# Patient Record
Sex: Female | Born: 1980 | Race: Black or African American | Hispanic: No | Smoking: Never smoker
Health system: Southern US, Community
[De-identification: ages and names within clinical notes are randomized; demographics above are authoritative.]

## PROBLEM LIST (undated history)

## (undated) DIAGNOSIS — I10 Essential (primary) hypertension: Secondary | ICD-10-CM

## (undated) DIAGNOSIS — D219 Benign neoplasm of connective and other soft tissue, unspecified: Secondary | ICD-10-CM

## (undated) DIAGNOSIS — J45909 Unspecified asthma, uncomplicated: Secondary | ICD-10-CM

## (undated) DIAGNOSIS — O139 Gestational [pregnancy-induced] hypertension without significant proteinuria, unspecified trimester: Secondary | ICD-10-CM

## (undated) DIAGNOSIS — F419 Anxiety disorder, unspecified: Secondary | ICD-10-CM

## (undated) DIAGNOSIS — D649 Anemia, unspecified: Secondary | ICD-10-CM

## (undated) HISTORY — PX: MYOMECTOMY: SHX85

## (undated) HISTORY — DX: Benign neoplasm of connective and other soft tissue, unspecified: D21.9

## (undated) HISTORY — PX: HYSTEROSCOPY: SHX211

---
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
## 1999-06-16 ENCOUNTER — Other Ambulatory Visit: Admission: RE | Admit: 1999-06-16 | Discharge: 1999-06-16 | Payer: Self-pay | Admitting: Internal Medicine

## 2001-10-08 ENCOUNTER — Emergency Department (HOSPITAL_COMMUNITY): Admission: EM | Admit: 2001-10-08 | Discharge: 2001-10-09 | Payer: Self-pay | Admitting: Emergency Medicine

## 2001-10-14 ENCOUNTER — Emergency Department (HOSPITAL_COMMUNITY): Admission: EM | Admit: 2001-10-14 | Discharge: 2001-10-14 | Payer: Self-pay | Admitting: Emergency Medicine

## 2001-12-07 ENCOUNTER — Emergency Department (HOSPITAL_COMMUNITY): Admission: EM | Admit: 2001-12-07 | Discharge: 2001-12-08 | Payer: Self-pay | Admitting: Emergency Medicine

## 2004-08-07 ENCOUNTER — Other Ambulatory Visit: Admission: RE | Admit: 2004-08-07 | Discharge: 2004-08-07 | Payer: Self-pay | Admitting: Gynecology

## 2005-05-30 ENCOUNTER — Emergency Department (HOSPITAL_COMMUNITY): Admission: EM | Admit: 2005-05-30 | Discharge: 2005-05-30 | Payer: Self-pay | Admitting: Emergency Medicine

## 2005-06-25 ENCOUNTER — Emergency Department (HOSPITAL_COMMUNITY): Admission: EM | Admit: 2005-06-25 | Discharge: 2005-06-25 | Payer: Self-pay | Admitting: Emergency Medicine

## 2005-08-21 ENCOUNTER — Other Ambulatory Visit: Admission: RE | Admit: 2005-08-21 | Discharge: 2005-08-21 | Payer: Self-pay | Admitting: Gynecology

## 2006-05-10 ENCOUNTER — Emergency Department (HOSPITAL_COMMUNITY): Admission: EM | Admit: 2006-05-10 | Discharge: 2006-05-10 | Payer: Self-pay | Admitting: Emergency Medicine

## 2006-05-13 ENCOUNTER — Emergency Department (HOSPITAL_COMMUNITY): Admission: EM | Admit: 2006-05-13 | Discharge: 2006-05-13 | Payer: Self-pay | Admitting: Emergency Medicine

## 2007-06-09 ENCOUNTER — Other Ambulatory Visit: Admission: RE | Admit: 2007-06-09 | Discharge: 2007-06-09 | Payer: Self-pay | Admitting: Gynecology

## 2008-02-01 ENCOUNTER — Ambulatory Visit: Payer: Self-pay | Admitting: Women's Health

## 2009-06-07 ENCOUNTER — Ambulatory Visit: Payer: Self-pay | Admitting: Women's Health

## 2009-06-07 ENCOUNTER — Other Ambulatory Visit: Admission: RE | Admit: 2009-06-07 | Discharge: 2009-06-07 | Payer: Self-pay | Admitting: Gynecology

## 2011-01-21 ENCOUNTER — Encounter: Payer: Self-pay | Admitting: Women's Health

## 2011-02-04 ENCOUNTER — Encounter: Payer: Self-pay | Admitting: Women's Health

## 2011-02-04 ENCOUNTER — Other Ambulatory Visit (HOSPITAL_COMMUNITY)
Admission: RE | Admit: 2011-02-04 | Discharge: 2011-02-04 | Disposition: A | Discharge status: Home / Self Care | Payer: 59 | Source: Ambulatory Visit | Attending: Gynecology | Admitting: Gynecology

## 2011-02-04 ENCOUNTER — Ambulatory Visit (INDEPENDENT_AMBULATORY_CARE_PROVIDER_SITE_OTHER): Payer: 59 | Admitting: Women's Health

## 2011-02-04 VITALS — BP 118/72 | Ht 64.0 in | Wt 269.0 lb

## 2011-02-04 DIAGNOSIS — R82998 Other abnormal findings in urine: Secondary | ICD-10-CM

## 2011-02-04 DIAGNOSIS — Z01419 Encounter for gynecological examination (general) (routine) without abnormal findings: Secondary | ICD-10-CM | POA: Insufficient documentation

## 2011-02-04 DIAGNOSIS — Z833 Family history of diabetes mellitus: Secondary | ICD-10-CM

## 2011-02-04 DIAGNOSIS — Z113 Encounter for screening for infections with a predominantly sexual mode of transmission: Secondary | ICD-10-CM

## 2011-02-04 NOTE — Progress Notes (Signed)
Addended by: Landis Martins R on: 02/04/2011 04:33 PM   Modules accepted: Orders

## 2011-02-04 NOTE — Progress Notes (Signed)
SAMONA CHIHUAHUA [birth year] [id number]   History:    The patient presents for annual exam. Management for hardees. Has not been able to conceive, husband has a confirmed very low sperm count from an SA at a urologist office. He is also a diabetic on insulin and is hypertensive.     Past medical history, past surgical history, family history and social history were all reviewed and documented in the EPIC chart.   ROS:  A  ROS was performed and pertinent positives and negatives are included in the history.  Exam:  Filed Vitals:   02/04/11 1537  BP: 118/72    General appearance:  Normal Head/Neck:  Normal, without cervical or supraclavicular adenopathy. Thyroid:  Symmetrical, normal in size, without palpable masses or nodularity. Respiratory  Effort:  Normal  Auscultation:  Clear without wheezing or rhonchi Cardiovascular  Auscultation:  Regular rate, without rubs, murmurs or gallops  Edema/varicosities:  Not grossly evident Abdominal  Soft,nontender, without masses, guarding or rebound.  Liver/spleen:  No organomegaly noted  Hernia:  None appreciated  Skin  Inspection:  Grossly normal  Palpation:  Grossly normal Neurologic/psychiatric  Orientation:  Normal with appropriate conversation.  Mood/affect:  Normal  Genitourinary    Breasts: Examined lying and sitting.     Right: Without masses, retractions, discharge or axillary adenopathy.     Left: Without masses, retractions, discharge or axillary adenopathy.   Inguinal/mons:  Normal without inguinal adenopathy  External genitalia:  Normal  BUS/Urethra/Skene's glands:  Normal  Bladder:  Normal  Vagina:  Normal  Cervix:  Normal  Uterus:   normal in size, shape and contour.  Midline and mobile  Adnexa/parametria:     Rt: Without masses or tenderness.   Lt: Without masses or tenderness.  Anus and perineum: Normal  Digital rectal exam: Normal sphincter tone without palpated masses or tenderness  Assessment/Plan:  30 y.o.  MBF G0 for annual exam monthly 4 day cycle using no contraception. She would like to conceive, has spoken to an adoption agency and has thought about donor insemination. Long discussion about weight loss for her health prior to  pregnancy. They did have a breakup this past year where he was sexually active with someone else, states marriage is now better.  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 CBC, glucose, UA, Pap, GC/ Chlamydia, HIV, hepatitis B and C and RPR.    Harrington Challenger [facility], 4:01 PM 02/04/2011

## 2011-02-05 LAB — HEPATITIS C ANTIBODY: HCV Ab: NEGATIVE

## 2011-02-05 LAB — HIV ANTIBODY (ROUTINE TESTING W REFLEX): HIV: NONREACTIVE

## 2011-02-05 LAB — RPR

## 2012-04-22 ENCOUNTER — Encounter (HOSPITAL_COMMUNITY): Payer: Self-pay | Admitting: Adult Health

## 2012-04-22 DIAGNOSIS — Z3202 Encounter for pregnancy test, result negative: Secondary | ICD-10-CM | POA: Insufficient documentation

## 2012-04-22 DIAGNOSIS — R109 Unspecified abdominal pain: Secondary | ICD-10-CM | POA: Insufficient documentation

## 2012-04-22 DIAGNOSIS — Z8742 Personal history of other diseases of the female genital tract: Secondary | ICD-10-CM | POA: Insufficient documentation

## 2012-04-22 DIAGNOSIS — R110 Nausea: Secondary | ICD-10-CM | POA: Insufficient documentation

## 2012-04-22 NOTE — ED Notes (Signed)
Presents with lower back pain and lower abdominal pain that began 2 hours ago associated with nausea. Pain is better after taking acetaminophen but nausea still present. Pt reports she is about to start period and has had some blood from vagina but not like period. Denies urinary difficulty, denies dysuria, denies foul odors, c/o urinary frequency. LMP unknown.

## 2012-04-23 ENCOUNTER — Emergency Department (HOSPITAL_COMMUNITY)
Admission: EM | Admit: 2012-04-23 | Discharge: 2012-04-23 | Disposition: A | Discharge status: Home / Self Care | Payer: Self-pay | Attending: Emergency Medicine | Admitting: Emergency Medicine

## 2012-04-23 DIAGNOSIS — R109 Unspecified abdominal pain: Secondary | ICD-10-CM

## 2012-04-23 LAB — URINALYSIS, MICROSCOPIC ONLY
Glucose, UA: NEGATIVE mg/dL
pH: 5 (ref 5.0–8.0)

## 2012-04-23 LAB — CBC WITH DIFFERENTIAL/PLATELET
Basophils Absolute: 0 10*3/uL (ref 0.0–0.1)
Eosinophils Absolute: 0 10*3/uL (ref 0.0–0.7)
Eosinophils Relative: 1 % (ref 0–5)
HCT: 35.9 % — ABNORMAL LOW (ref 36.0–46.0)
Hemoglobin: 11.7 g/dL — ABNORMAL LOW (ref 12.0–15.0)
Lymphocytes Relative: 19 % (ref 12–46)
MCH: 26 pg (ref 26.0–34.0)
Neutro Abs: 5.3 10*3/uL (ref 1.7–7.7)
Neutrophils Relative %: 73 % (ref 43–77)
RBC: 4.5 MIL/uL (ref 3.87–5.11)
WBC: 7.3 10*3/uL (ref 4.0–10.5)

## 2012-04-23 LAB — BASIC METABOLIC PANEL
BUN: 10 mg/dL (ref 6–23)
Chloride: 103 mEq/L (ref 96–112)
Potassium: 3.9 mEq/L (ref 3.5–5.1)
Sodium: 137 mEq/L (ref 135–145)

## 2012-04-23 NOTE — ED Notes (Signed)
Pt dc to home.  Pt ambulatory to exit without difficulty.  Pt denies need for w/c.  Pt states understanding to dc paperwork

## 2012-04-23 NOTE — ED Provider Notes (Signed)
Medical screening examination/treatment/procedure(s) were performed by non-physician practitioner and as supervising physician I was immediately available for consultation/collaboration.  Tobin Chad, MD 04/23/12 [phone]

## 2012-04-23 NOTE — ED Provider Notes (Signed)
History     CSN: [id number]  Arrival date & time 04/22/12  2333   First MD Initiated Contact with Patient 04/23/12 0049      Chief Complaint  Patient presents with  . Abdominal Pain    (Consider location/radiation/quality/duration/timing/severity/associated sxs/prior treatment) HPI Comments: This is a 31 year old female who presents to the ED with a chief complaint of abdominal pain.  Patient states that she has a history of dysmenorrhea.  She states that she is currently on her period.  She states that the pain began this several hours ago and radiated to her back.  The patient took tylenol, and now has 0/10 pain.  The patient denies dysuria, vaginal discharge.  Patient denies headache, blurred vision, new hearing loss, sore throat, chest pain, shortness of breath, vomiting, diarrhea, constipation, dysuria, peripheral edema, back pain, numbness or tingling of the extremities.   The history is provided by the patient. No language interpreter was used.    Past Medical History  Diagnosis Date  . Morbid obesity     5'4 1/2"  263 lbs on 06/07/09    History reviewed. No pertinent past surgical history.  Family History  Problem Relation Age of Onset  . Hypertension Mother   . Hypertension Father     History  Substance Use Topics  . Smoking status: Never Smoker   . Smokeless tobacco: Never Used  . Alcohol Use: Yes     Comment: SOCIALLY    OB History    Grav Para Term Preterm Abortions TAB SAB Ect Mult Living   0               Review of Systems  Gastrointestinal: Positive for nausea. Negative for vomiting, abdominal pain and abdominal distention.  All other systems reviewed and are negative.    Allergies  Review of patient's allergies indicates no known allergies.  Home Medications  No current outpatient prescriptions on file.  BP 142/93  Pulse 92  Temp 98.2 F (36.8 C) (Oral)  Resp 16  SpO2 100%  Physical Exam  Nursing note and vitals  reviewed. Constitutional: She is oriented to person, place, and time. She appears well-developed and well-nourished.  HENT:  Head: Normocephalic and atraumatic.  Eyes: Conjunctivae normal and EOM are normal. Pupils are equal, round, and reactive to light.  Neck: Normal range of motion. Neck supple.  Cardiovascular: Normal rate and regular rhythm.  Exam reveals no gallop and no friction rub.   No murmur heard. Pulmonary/Chest: Effort normal and breath sounds normal. No respiratory distress. She has no wheezes. She has no rales. She exhibits no tenderness.  Abdominal: Soft. Bowel sounds are normal. She exhibits no distension and no mass. There is no tenderness. There is no rebound and no guarding.  Genitourinary: No labial fusion. There is no rash, tenderness, lesion or injury on the right labia. There is no rash, tenderness, lesion or injury on the left labia. Uterus is not deviated, not enlarged, not fixed and not tender. Cervix exhibits no motion tenderness, no discharge and no friability. Right adnexum displays no mass, no tenderness and no fullness. Left adnexum displays no mass, no tenderness and no fullness. There is bleeding around the vagina. No erythema or tenderness around the vagina. No foreign body around the vagina. No signs of injury around the vagina. No vaginal discharge found.  Musculoskeletal: Normal range of motion. She exhibits no edema and no tenderness.  Neurological: She is alert and oriented to person, place, and time.  Skin: Skin is warm and dry.  Psychiatric: She has a normal mood and affect. Her behavior is normal. Judgment and thought content normal.    ED Course  Procedures (including critical care time)  Labs Reviewed  URINALYSIS, MICROSCOPIC ONLY - Abnormal; Notable for the following:    Color, Urine RED (*)  BIOCHEMICALS MAY BE AFFECTED BY COLOR   APPearance TURBID (*)     Hgb urine dipstick LARGE (*)     Bilirubin Urine SMALL (*)     Ketones, ur 15 (*)      Protein, ur 100 (*)     Leukocytes, UA SMALL (*)     Bacteria, UA MANY (*)     Squamous Epithelial / LPF MANY (*)     All other components within normal limits  POCT PREGNANCY, URINE  URINE CULTURE  WET PREP, GENITAL  GC/CHLAMYDIA PROBE AMP  CBC WITH DIFFERENTIAL  BASIC METABOLIC PANEL   Results for orders placed during the hospital encounter of 04/23/12  URINALYSIS, MICROSCOPIC ONLY      Component Value Range   Color, Urine RED (*) YELLOW   APPearance TURBID (*) CLEAR   Specific Gravity, Urine 1.028  1.005 - 1.030   pH 5.0  5.0 - 8.0   Glucose, UA NEGATIVE  NEGATIVE mg/dL   Hgb urine dipstick LARGE (*) NEGATIVE   Bilirubin Urine SMALL (*) NEGATIVE   Ketones, ur 15 (*) NEGATIVE mg/dL   Protein, ur 409 (*) NEGATIVE mg/dL   Urobilinogen, UA 1.0  0.0 - 1.0 mg/dL   Nitrite NEGATIVE  NEGATIVE   Leukocytes, UA SMALL (*) NEGATIVE   WBC, UA 3-6  <3 WBC/hpf   RBC / HPF TOO NUMEROUS TO COUNT  <3 RBC/hpf   Bacteria, UA MANY (*) RARE   Squamous Epithelial / LPF MANY (*) RARE  POCT PREGNANCY, URINE      Component Value Range   Preg Test, Ur NEGATIVE  NEGATIVE  WET PREP, GENITAL      Component Value Range   Yeast Wet Prep HPF POC NONE SEEN  NONE SEEN   Trich, Wet Prep NONE SEEN  NONE SEEN   Clue Cells Wet Prep HPF POC FEW (*) NONE SEEN   WBC, Wet Prep HPF POC FEW (*) NONE SEEN  CBC WITH DIFFERENTIAL      Component Value Range   WBC 7.3  4.0 - 10.5 K/uL   RBC 4.50  3.87 - 5.11 MIL/uL   Hemoglobin 11.7 (*) 12.0 - 15.0 g/dL   HCT 81.1 (*) 91.4 - 78.2 %   MCV 79.8  78.0 - 100.0 fL   MCH 26.0  26.0 - 34.0 pg   MCHC 32.6  30.0 - 36.0 g/dL   RDW 95.6  21.3 - 08.6 %   Platelets 292  150 - 400 K/uL   Neutrophils Relative 73  43 - 77 %   Neutro Abs 5.3  1.7 - 7.7 K/uL   Lymphocytes Relative 19  12 - 46 %   Lymphs Abs 1.4  0.7 - 4.0 K/uL   Monocytes Relative 7  3 - 12 %   Monocytes Absolute 0.5  0.1 - 1.0 K/uL   Eosinophils Relative 1  0 - 5 %   Eosinophils Absolute 0.0  0.0 - 0.7  K/uL   Basophils Relative 1  0 - 1 %   Basophils Absolute 0.0  0.0 - 0.1 K/uL  BASIC METABOLIC PANEL      Component Value Range   Sodium 137  135 - 145 mEq/L   Potassium 3.9  3.5 - 5.1 mEq/L   Chloride 103  96 - 112 mEq/L   CO2 23  19 - 32 mEq/L   Glucose, Bld 108 (*) 70 - 99 mg/dL   BUN 10  6 - 23 mg/dL   Creatinine, Ser 1.61  0.50 - 1.10 mg/dL   Calcium 9.6  8.4 - 09.6 mg/dL   GFR calc non Af Amer >90  >90 mL/min   GFR calc Af Amer >90  >90 mL/min   No results found.    1. Abdominal  pain, other specified site       MDM  31 year old female with abdominal pain. Patient states that the pain began approximately 2 hours ago. She states that she has had this pain before and that it is associated with menstruation. She took acetaminophen, which significantly reduced if not resolved her pain. Her abdominal exam has been unremarkable, no reproducible tenderness, or signs of peritonitis, on pelvic exam there is no adnexal tenderness cervical motion tenderness, and no masses were palpated. The patient remains asymptomatic in the ED at this time. In considering further imaging, I feel that the patient is appropriate to be discharged at this time, as she remains asymptomatic, and without reproducible pain. I'm going to discharge the patient to home with strict return precautions. Patient is to return if she notices any more pain, or if she develops fever, vomiting, diarrhea. I discussed this patient with Dr. Lorenso Courier, who agrees with the plan. She is stable and ready for discharge.        Roxy Horseman, PA-C 04/23/12 [phone]

## 2012-04-24 LAB — URINE CULTURE

## 2012-07-11 ENCOUNTER — Emergency Department: Payer: Self-pay | Admitting: Emergency Medicine

## 2014-10-05 ENCOUNTER — Ambulatory Visit: Payer: Self-pay | Admitting: Women's Health

## 2014-10-23 ENCOUNTER — Ambulatory Visit: Payer: 59 | Admitting: Women's Health

## 2014-11-06 ENCOUNTER — Encounter: Payer: Self-pay | Admitting: Women's Health

## 2014-11-06 ENCOUNTER — Ambulatory Visit (INDEPENDENT_AMBULATORY_CARE_PROVIDER_SITE_OTHER): Payer: 59 | Admitting: Women's Health

## 2014-11-06 ENCOUNTER — Other Ambulatory Visit (HOSPITAL_COMMUNITY)
Admission: RE | Admit: 2014-11-06 | Discharge: 2014-11-06 | Disposition: A | Discharge status: Home / Self Care | Payer: 59 | Source: Ambulatory Visit | Attending: Women's Health | Admitting: Women's Health

## 2014-11-06 VITALS — BP 132/78 | Ht 65.0 in | Wt 291.0 lb

## 2014-11-06 DIAGNOSIS — Z1151 Encounter for screening for human papillomavirus (HPV): Secondary | ICD-10-CM | POA: Diagnosis present

## 2014-11-06 DIAGNOSIS — Z113 Encounter for screening for infections with a predominantly sexual mode of transmission: Secondary | ICD-10-CM | POA: Insufficient documentation

## 2014-11-06 DIAGNOSIS — R87810 Cervical high risk human papillomavirus (HPV) DNA test positive: Secondary | ICD-10-CM | POA: Insufficient documentation

## 2014-11-06 DIAGNOSIS — Z01419 Encounter for gynecological examination (general) (routine) without abnormal findings: Secondary | ICD-10-CM | POA: Insufficient documentation

## 2014-11-06 DIAGNOSIS — Z124 Encounter for screening for malignant neoplasm of cervix: Secondary | ICD-10-CM

## 2014-11-06 NOTE — Patient Instructions (Signed)
Health Maintenance Adopting a healthy lifestyle and getting preventive care can go a long way to promote health and wellness. Talk with your health care provider about what schedule of regular examinations is right for you. This is a good chance for you to check in with your provider about disease prevention and staying healthy. In between checkups, there are plenty of things you can do on your own. Experts have done a lot of research about which lifestyle changes and preventive measures are most likely to keep you healthy. Ask your health care provider for more information. WEIGHT AND DIET  Eat a healthy diet  Be sure to include plenty of vegetables, fruits, low-fat dairy products, and lean protein.  Do not eat a lot of foods high in solid fats, added sugars, or salt.  Get regular exercise. This is one of the most important things you can do for your health.  Most adults should exercise for at least 150 minutes each week. The exercise should increase your heart rate and make you sweat (moderate-intensity exercise).  Most adults should also do strengthening exercises at least twice a week. This is in addition to the moderate-intensity exercise.  Maintain a healthy weight  Body mass index (BMI) is a measurement that can be used to identify possible weight problems. It estimates body fat based on height and weight. Your health care provider can help determine your BMI and help you achieve or maintain a healthy weight.  For females 25 years of age and older:   A BMI below 18.5 is considered underweight.  A BMI of 18.5 to 24.9 is normal.  A BMI of 25 to 29.9 is considered overweight.  A BMI of 30 and above is considered obese.  Watch levels of cholesterol and blood lipids  You should start having your blood tested for lipids and cholesterol at 35 years of age, then have this test every 5 years.  You may need to have your cholesterol levels checked more often if:  Your lipid or  cholesterol levels are high.  You are older than 35 years of age.  You are at high risk for heart disease.  CANCER SCREENING   Lung Cancer  Lung cancer screening is recommended for adults 97-92 years old who are at high risk for lung cancer because of a history of smoking.  A yearly low-dose CT scan of the lungs is recommended for people who:  Currently smoke.  Have quit within the past 15 years.  Have at least a 30-pack-year history of smoking. A pack year is smoking an average of one pack of cigarettes a day for 1 year.  Yearly screening should continue until it has been 15 years since you quit.  Yearly screening should stop if you develop a health problem that would prevent you from having lung cancer treatment.  Breast Cancer  Practice breast self-awareness. This means understanding how your breasts normally appear and feel.  It also means doing regular breast self-exams. Let your health care provider know about any changes, no matter how small.  If you are in your 20s or 30s, you should have a clinical breast exam (CBE) by a health care provider every 1-3 years as part of a regular health exam.  If you are 76 or older, have a CBE every year. Also consider having a breast X-ray (mammogram) every year.  If you have a family history of breast cancer, talk to your health care provider about genetic screening.  If you are  at high risk for breast cancer, talk to your health care provider about having an MRI and a mammogram every year.  Breast cancer gene (BRCA) assessment is recommended for women who have family members with BRCA-related cancers. BRCA-related cancers include:  Breast.  Ovarian.  Tubal.  Peritoneal cancers.  Results of the assessment will determine the need for genetic counseling and BRCA1 and BRCA2 testing. Cervical Cancer Routine pelvic examinations to screen for cervical cancer are no longer recommended for nonpregnant women who are considered low  risk for cancer of the pelvic organs (ovaries, uterus, and vagina) and who do not have symptoms. A pelvic examination may be necessary if you have symptoms including those associated with pelvic infections. Ask your health care provider if a screening pelvic exam is right for you.   The Pap test is the screening test for cervical cancer for women who are considered at risk.  If you had a hysterectomy for a problem that was not cancer or a condition that could lead to cancer, then you no longer need Pap tests.  If you are older than 65 years, and you have had normal Pap tests for the past 10 years, you no longer need to have Pap tests.  If you have had past treatment for cervical cancer or a condition that could lead to cancer, you need Pap tests and screening for cancer for at least 20 years after your treatment.  If you no longer get a Pap test, assess your risk factors if they change (such as having a new sexual partner). This can affect whether you should start being screened again.  Some women have medical problems that increase their chance of getting cervical cancer. If this is the case for you, your health care provider may recommend more frequent screening and Pap tests.  The human papillomavirus (HPV) test is another test that may be used for cervical cancer screening. The HPV test looks for the virus that can cause cell changes in the cervix. The cells collected during the Pap test can be tested for HPV.  The HPV test can be used to screen women 30 years of age and older. Getting tested for HPV can extend the interval between normal Pap tests from three to five years.  An HPV test also should be used to screen women of any age who have unclear Pap test results.  After 35 years of age, women should have HPV testing as often as Pap tests.  Colorectal Cancer  This type of cancer can be detected and often prevented.  Routine colorectal cancer screening usually begins at 35 years of  age and continues through 35 years of age.  Your health care provider may recommend screening at an earlier age if you have risk factors for colon cancer.  Your health care provider may also recommend using home test kits to check for hidden blood in the stool.  A small camera at the end of a tube can be used to examine your colon directly (sigmoidoscopy or colonoscopy). This is done to check for the earliest forms of colorectal cancer.  Routine screening usually begins at age 50.  Direct examination of the colon should be repeated every 5-10 years through 35 years of age. However, you may need to be screened more often if early forms of precancerous polyps or small growths are found. Skin Cancer  Check your skin from head to toe regularly.  Tell your health care provider about any new moles or changes in   moles, especially if there is a change in a mole's shape or color.  Also tell your health care provider if you have a mole that is larger than the size of a pencil eraser.  Always use sunscreen. Apply sunscreen liberally and repeatedly throughout the day.  Protect yourself by wearing long sleeves, pants, a wide-brimmed hat, and sunglasses whenever you are outside. HEART DISEASE, DIABETES, AND HIGH BLOOD PRESSURE   Have your blood pressure checked at least every 1-2 years. High blood pressure causes heart disease and increases the risk of stroke.  If you are between 75 years and 42 years old, ask your health care provider if you should take aspirin to prevent strokes.  Have regular diabetes screenings. This involves taking a blood sample to check your fasting blood sugar level.  If you are at a normal weight and have a low risk for diabetes, have this test once every three years after 35 years of age.  If you are overweight and have a high risk for diabetes, consider being tested at a younger age or more often. PREVENTING INFECTION  Hepatitis B  If you have a higher risk for  hepatitis B, you should be screened for this virus. You are considered at high risk for hepatitis B if:  You were born in a country where hepatitis B is common. Ask your health care provider which countries are considered high risk.  Your parents were born in a high-risk country, and you have not been immunized against hepatitis B (hepatitis B vaccine).  You have HIV or AIDS.  You use needles to inject street drugs.  You live with someone who has hepatitis B.  You have had sex with someone who has hepatitis B.  You get hemodialysis treatment.  You take certain medicines for conditions, including cancer, organ transplantation, and autoimmune conditions. Hepatitis C  Blood testing is recommended for:  Everyone born from [birth year] through 1966.  Anyone with known risk factors for hepatitis C. Sexually transmitted infections (STIs)  You should be screened for sexually transmitted infections (STIs) including gonorrhea and chlamydia if:  You are sexually active and are younger than 35 years of age.  You are older than 35 years of age and your health care provider tells you that you are at risk for this type of infection.  Your sexual activity has changed since you were last screened and you are at an increased risk for chlamydia or gonorrhea. Ask your health care provider if you are at risk.  If you do not have HIV, but are at risk, it may be recommended that you take a prescription medicine daily to prevent HIV infection. This is called pre-exposure prophylaxis (PrEP). You are considered at risk if:  You are sexually active and do not regularly use condoms or know the HIV status of your partner(s).  You take drugs by injection.  You are sexually active with a partner who has HIV. Talk with your health care provider about whether you are at high risk of being infected with HIV. If you choose to begin PrEP, you should first be tested for HIV. You should then be tested every 3 months for  as long as you are taking PrEP.  PREGNANCY   If you are premenopausal and you may become pregnant, ask your health care provider about preconception counseling.  If you may become pregnant, take 400 to 800 micrograms (mcg) of folic acid every day.  If you want to prevent pregnancy, talk to your  health care provider about birth control (contraception). OSTEOPOROSIS AND MENOPAUSE   Osteoporosis is a disease in which the bones lose minerals and strength with aging. This can result in serious bone fractures. Your risk for osteoporosis can be identified using a bone density scan.  If you are 32 years of age or older, or if you are at risk for osteoporosis and fractures, ask your health care provider if you should be screened.  Ask your health care provider whether you should take a calcium or vitamin D supplement to lower your risk for osteoporosis.  Menopause may have certain physical symptoms and risks.  Hormone replacement therapy may reduce some of these symptoms and risks. Talk to your health care provider about whether hormone replacement therapy is right for you.  HOME CARE INSTRUCTIONS   Schedule regular health, dental, and eye exams.  Stay current with your immunizations.   Do not use any tobacco products including cigarettes, chewing tobacco, or electronic cigarettes.  If you are pregnant, do not drink alcohol.  If you are breastfeeding, limit how much and how often you drink alcohol.  Limit alcohol intake to no more than 1 drink per day for nonpregnant women. One drink equals 12 ounces of beer, 5 ounces of wine, or 1 ounces of hard liquor.  Do not use street drugs.  Do not share needles.  Ask your health care provider for help if you need support or information about quitting drugs.  Tell your health care provider if you often feel depressed.  Tell your health care provider if you have ever been abused or do not feel safe at home. Document Released: 11/03/2010  Document Revised: 09/04/2013 Document Reviewed: 03/22/2013 [facility] Patient Information 2015 [place], [place]. This information is not intended to replace advice given to you by your health care provider. Make sure you discuss any questions you have with your health care provider. Exercise to Stay Healthy Exercise helps you become and stay healthy. EXERCISE IDEAS AND TIPS Choose exercises that:  You enjoy.  Fit into your day. You do not need to exercise really hard to be healthy. You can do exercises at a slow or medium level and stay healthy. You can:  Stretch before and after working out.  Try yoga, Pilates, or tai chi.  Lift weights.  Walk fast, swim, jog, run, climb stairs, bicycle, dance, or rollerskate.  Take aerobic classes. Exercises that burn about 150 calories:  Running 1  miles in 15 minutes.  Playing volleyball for 45 to 60 minutes.  Washing and waxing a car for 45 to 60 minutes.  Playing touch football for 45 minutes.  Walking 1  miles in 35 minutes.  Pushing a stroller 1  miles in 30 minutes.  Playing basketball for 30 minutes.  Raking leaves for 30 minutes.  Bicycling 5 miles in 30 minutes.  Walking 2 miles in 30 minutes.  Dancing for 30 minutes.  Shoveling snow for 15 minutes.  Swimming laps for 20 minutes.  Walking up stairs for 15 minutes.  Bicycling 4 miles in 15 minutes.  Gardening for 30 to 45 minutes.  Jumping rope for 15 minutes.  Washing windows or floors for 45 to 60 minutes. Document Released: 05/23/2010 Document Revised: 07/13/2011 Document Reviewed: 05/23/2010 [facility] Patient Information 2015 [place], [place]. This information is not intended to replace advice given to you by your health care provider. Make sure you discuss any questions you have with your health care provider. Fat and Cholesterol Control Diet Fat and cholesterol  levels in your blood and organs are influenced by your diet. High levels of fat and cholesterol  may lead to diseases of the heart, small and large blood vessels, gallbladder, liver, and pancreas. CONTROLLING FAT AND CHOLESTEROL WITH DIET Although exercise and lifestyle factors are important, your diet is key. That is because certain foods are known to raise cholesterol and others to lower it. The goal is to balance foods for their effect on cholesterol and more importantly, to replace saturated and trans fat with other types of fat, such as monounsaturated fat, polyunsaturated fat, and omega-3 fatty acids. On average, a person should consume no more than 15 to 17 g of saturated fat daily. Saturated and trans fats are considered "bad" fats, and they will raise LDL cholesterol. Saturated fats are primarily found in animal products such as meats, butter, and cream. However, that does not mean you need to give up all your favorite foods. Today, there are good tasting, low-fat, low-cholesterol substitutes for most of the things you like to eat. Choose low-fat or nonfat alternatives. Choose round or loin cuts of red meat. These types of cuts are lowest in fat and cholesterol. Chicken (without the skin), fish, veal, and ground [place] breast are great choices. Eliminate fatty meats, such as hot dogs and salami. Even shellfish have little or no saturated fat. Have a 3 oz (85 g) portion when you eat lean meat, poultry, or fish. Trans fats are also called "partially hydrogenated oils." They are oils that have been scientifically manipulated so that they are solid at room temperature resulting in a longer shelf life and improved taste and texture of foods in which they are added. Trans fats are found in stick margarine, some tub margarines, cookies, crackers, and baked goods.  When baking and cooking, oils are a great substitute for butter. The monounsaturated oils are especially beneficial since it is believed they lower LDL and raise HDL. The oils you should avoid entirely are saturated tropical oils, such as  coconut and palm.  Remember to eat a lot from food groups that are naturally free of saturated and trans fat, including fish, fruit, vegetables, beans, grains (barley, rice, couscous, bulgur wheat), and pasta (without cream sauces).  IDENTIFYING FOODS THAT LOWER FAT AND CHOLESTEROL  Soluble fiber may lower your cholesterol. This type of fiber is found in fruits such as apples, vegetables such as broccoli, potatoes, and carrots, legumes such as beans, peas, and lentils, and grains such as barley. Foods fortified with plant sterols (phytosterol) may also lower cholesterol. You should eat at least 2 g per day of these foods for a cholesterol lowering effect.  Read package labels to identify low-saturated fats, trans fat free, and low-fat foods at the supermarket. Select cheeses that have only 2 to 3 g saturated fat per ounce. Use a heart-healthy tub margarine that is free of trans fats or partially hydrogenated oil. When buying baked goods (cookies, crackers), avoid partially hydrogenated oils. Breads and muffins should be made from whole grains (whole-wheat or whole oat flour, instead of "flour" or "enriched flour"). Buy non-creamy canned soups with reduced salt and no added fats.  FOOD PREPARATION TECHNIQUES  Never deep-fry. If you must fry, either stir-fry, which uses very little fat, or use non-stick cooking sprays. When possible, broil, bake, or roast meats, and steam vegetables. Instead of putting butter or margarine on vegetables, use lemon and herbs, applesauce, and cinnamon (for squash and sweet potatoes). Use nonfat yogurt, salsa, and low-fat dressings for salads.  LOW-SATURATED FAT / LOW-FAT FOOD SUBSTITUTES Meats / Saturated Fat (g)  Avoid: Steak, marbled (3 oz/85 g) / 11 g  Choose: Steak, lean (3 oz/85 g) / 4 g  Avoid: Hamburger (3 oz/85 g) / 7 g  Choose: Hamburger, lean (3 oz/85 g) / 5 g  Avoid: Ham (3 oz/85 g) / 6 g  Choose: Ham, lean cut (3 oz/85 g) / 2.4 g  Avoid: Chicken, with  skin, dark meat (3 oz/85 g) / 4 g  Choose: Chicken, skin removed, dark meat (3 oz/85 g) / 2 g  Avoid: Chicken, with skin, light meat (3 oz/85 g) / 2.5 g  Choose: Chicken, skin removed, light meat (3 oz/85 g) / 1 g Dairy / Saturated Fat (g)  Avoid: Whole milk (1 cup) / 5 g  Choose: Low-fat milk, 2% (1 cup) / 3 g  Choose: Low-fat milk, 1% (1 cup) / 1.5 g  Choose: Skim milk (1 cup) / 0.3 g  Avoid: Hard cheese (1 oz/28 g) / 6 g  Choose: Skim milk cheese (1 oz/28 g) / 2 to 3 g  Avoid: Cottage cheese, 4% fat (1 cup) / 6.5 g  Choose: Low-fat cottage cheese, 1% fat (1 cup) / 1.5 g  Avoid: Ice cream (1 cup) / 9 g  Choose: Sherbet (1 cup) / 2.5 g  Choose: Nonfat frozen yogurt (1 cup) / 0.3 g  Choose: Frozen fruit bar / trace  Avoid: Whipped cream (1 tbs) / 3.5 g  Choose: Nondairy whipped topping (1 tbs) / 1 g Condiments / Saturated Fat (g)  Avoid: Mayonnaise (1 tbs) / 2 g  Choose: Low-fat mayonnaise (1 tbs) / 1 g  Avoid: Butter (1 tbs) / 7 g  Choose: Extra light margarine (1 tbs) / 1 g  Avoid: Coconut oil (1 tbs) / 11.8 g  Choose: Olive oil (1 tbs) / 1.8 g  Choose: Corn oil (1 tbs) / 1.7 g  Choose: Safflower oil (1 tbs) / 1.2 g  Choose: Sunflower oil (1 tbs) / 1.4 g  Choose: Soybean oil (1 tbs) / 2.4 g  Choose: Canola oil (1 tbs) / 1 g Document Released: 04/20/2005 Document Revised: 08/15/2012 Document Reviewed: 07/19/2013 [employer] Patient Information 2015 [place], [place]. This information is not intended to replace advice given to you by your health care provider. Make sure you discuss any questions you have with your health care provider.

## 2014-11-06 NOTE — Progress Notes (Signed)
Joann [name] 06-10-1980 [id number]    History:    Presents for annual exam with no complaints. Regular monthly 3-4 day cycles/no contraception/partner low sperm count. Requests STD screen has had some marital issues in the past, divorced but took him back.  Pregnancy desired but does not want to have donor insemination or fertility management. Normal Pap history. Weight gain of 15 pounds since 2012/recently began boot camp program and [employer] diet plan. Anxiety/PRN Klonopin/Meds and labs managed by Dr. Orland Mustard.   Past medical history, past surgical history, family history and social history were all reviewed and documented in the EPIC chart. 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 Family history of HTN, DM. Aunt died of breast cancer. Partner recent multiple toe amputations due to DM.   ROS:  A ROS was performed and pertinent positives and negatives are included.  Exam:  Filed Vitals:   11/06/14 1451  BP: 132/78    General appearance:  Morbid obesity  Thyroid:  Symmetrical, normal in size, without palpable masses or nodularity. Respiratory  Auscultation:  Clear without wheezing or rhonchi Cardiovascular  Auscultation:  Regular rate, without rubs, murmurs or gallops  Edema/varicosities:  Not grossly evident Abdominal  Soft,nontender, without masses, guarding or rebound.  Liver/spleen:  No organomegaly noted  Hernia:  None appreciated  Skin  Inspection:  Grossly normal   Breasts: Examined lying and sitting, pendulous.     Right: Without masses, retractions, discharge or axillary adenopathy.     Left: Without masses, retractions, discharge or axillary adenopathy. Gentitourinary   Inguinal/mons:  Normal without inguinal adenopathy  External genitalia:  Normal  BUS/Urethra/Skene's glands:  Normal  Vagina:  Normal  Cervix:  Normal  Uterus:  normal in size, shape and contour.  Midline and mobile  Adnexa/parametria:     Rt: Without masses or tenderness.   Lt: Without masses or  tenderness.  Anus and perineum: Normal  Digital rectal exam: Normal sphincter tone without palpated masses or tenderness  Assessment/Plan:  34 y.o.  G0 DBF for annual exam with no complaints  STD screen Anxiety (Klonopin managed by Dr. Orland Mustard) Morbid Obesity  Regular monthly cycle/no contraception/desires children   Plan: Weight loss and calorie restriction/healthy diet encouraged/continue boot camp and [employer] program. MVI/SBEs encouraged. Artificial insemination/fertility options discussed, declines intervention at this time. Foster parenting reviewed.. RPR, CG/Chlamydia, HIV, Hep C, Hep B. Pap with HR HPV typing.  Huel Cote [facility], 4:03 PM 11/06/2014

## 2014-11-08 LAB — CYTOLOGY - PAP

## 2014-11-14 ENCOUNTER — Other Ambulatory Visit: Payer: 59

## 2014-11-15 LAB — HEPATITIS B SURFACE ANTIGEN: HEP B S AG: NEGATIVE

## 2014-11-15 LAB — HIV ANTIBODY (ROUTINE TESTING W REFLEX): HIV: NONREACTIVE

## 2014-11-15 LAB — HEPATITIS C ANTIBODY: HCV Ab: NEGATIVE

## 2014-11-15 LAB — RPR

## 2015-11-02 ENCOUNTER — Encounter (HOSPITAL_COMMUNITY): Payer: Self-pay | Admitting: *Deleted

## 2015-11-02 ENCOUNTER — Emergency Department (HOSPITAL_COMMUNITY)
Admission: EM | Admit: 2015-11-02 | Discharge: 2015-11-02 | Disposition: A | Discharge status: Home / Self Care | Payer: 59 | Attending: Emergency Medicine | Admitting: Emergency Medicine

## 2015-11-02 DIAGNOSIS — L02211 Cutaneous abscess of abdominal wall: Secondary | ICD-10-CM | POA: Insufficient documentation

## 2015-11-02 DIAGNOSIS — L0291 Cutaneous abscess, unspecified: Secondary | ICD-10-CM | POA: Diagnosis present

## 2015-11-02 MED ORDER — DOXYCYCLINE HYCLATE 100 MG PO CAPS: 100.0000 mg | ORAL_CAPSULE | Freq: Two times a day (BID) | ORAL | Status: DC

## 2015-11-02 MED ORDER — LIDOCAINE-EPINEPHRINE (PF) 2 %-1:200000 IJ SOLN
10.0000 mL | Freq: Once | INTRAMUSCULAR | Status: AC
  Administered 2015-11-02: 10 mL
  Filled 2015-11-02: qty 20

## 2015-11-02 MED ORDER — IBUPROFEN 600 MG PO TABS: 600.0000 mg | ORAL_TABLET | Freq: Four times a day (QID) | ORAL | Status: DC | PRN

## 2015-11-02 NOTE — Discharge Instructions (Signed)
Call the number provided and ask for the hours of the walk in clinic on Tuesday, and see the Surgical team at that time. Keep the dressing clean and dry.   Abscess An abscess is an infected area that contains a collection of pus and debris.It can occur in almost any part of the body. An abscess is also known as a furuncle or boil. CAUSES  An abscess occurs when tissue gets infected. This can occur from blockage of oil or sweat glands, infection of hair follicles, or a minor injury to the skin. As the body tries to fight the infection, pus collects in the area and creates pressure under the skin. This pressure causes pain. People with weakened immune systems have difficulty fighting infections and get certain abscesses more often.  SYMPTOMS Usually an abscess develops on the skin and becomes a painful mass that is red, warm, and tender. If the abscess forms under the skin, you may feel a moveable soft area under the skin. Some abscesses break open (rupture) on their own, but most will continue to get worse without care. The infection can spread deeper into the body and eventually into the bloodstream, causing you to feel ill.  DIAGNOSIS  Your caregiver will take your medical history and perform a physical exam. A sample of fluid may also be taken from the abscess to determine what is causing your infection. TREATMENT  Your caregiver may prescribe antibiotic medicines to fight the infection. However, taking antibiotics alone usually does not cure an abscess. Your caregiver may need to make a small cut (incision) in the abscess to drain the pus. In some cases, gauze is packed into the abscess to reduce pain and to continue draining the area. HOME CARE INSTRUCTIONS   Only take over-the-counter or prescription medicines for pain, discomfort, or fever as directed by your caregiver.  If you were prescribed antibiotics, take them as directed. Finish them even if you start to feel better.  If gauze is  used, follow your caregiver's directions for changing the gauze.  To avoid spreading the infection:  Keep your draining abscess covered with a bandage.  Wash your hands well.  Do not share personal care items, towels, or whirlpools with others.  Avoid skin contact with others.  Keep your skin and clothes clean around the abscess.  Keep all follow-up appointments as directed by your caregiver. SEEK MEDICAL CARE IF:   You have increased pain, swelling, redness, fluid drainage, or bleeding.  You have muscle aches, chills, or a general ill feeling.  You have a fever. MAKE SURE YOU:   Understand these instructions.  Will watch your condition.  Will get help right away if you are not doing well or get worse.   This information is not intended to replace advice given to you by your health care provider. Make sure you discuss any questions you have with your health care provider.   Document Released: 01/28/2005 Document Revised: 10/20/2011 Document Reviewed: 07/03/2011 Elsevier Interactive Patient Education [employer].

## 2015-11-02 NOTE — ED Notes (Signed)
The pt has a lesion around her umbilicus that has been there for 2 weeks it is red and swollen the redness has increased in size and more pain.  Unknown temp  lmp last month

## 2015-11-02 NOTE — ED Provider Notes (Signed)
CSN: [id number]     Arrival date & time 11/02/15  1705 History   First MD Initiated Contact with Patient 11/02/15 1902     Chief Complaint  Patient presents with  . Abscess     (Consider location/radiation/quality/duration/timing/severity/associated sxs/prior Treatment) HPI Comments: Pt comes in with abscess. She reports hx of boils at various spots in the past. The current lesion started 2 weeks ago and has gotten worse. She has tried warm compresses with no relief. No n/v/f/c.    Patient is a 35 y.o. female presenting with abscess. The history is provided by the patient.  Abscess Associated symptoms: no fever     History reviewed. No pertinent past medical history. History reviewed. No pertinent past surgical history. Family History  Problem Relation Age of Onset  . Hypertension Mother   . Hypertension Father    Social History  Substance Use Topics  . Smoking status: Never Smoker   . Smokeless tobacco: Never Used  . Alcohol Use: 0.0 oz/week    0 Standard drinks or equivalent per week     Comment: SOCIALLY   OB History    Gravida Para Term Preterm AB TAB SAB Ectopic Multiple Living   0              Review of Systems  Constitutional: Negative for fever and activity change.  Gastrointestinal: Positive for abdominal pain.  Skin: Positive for rash.  Allergic/Immunologic: Negative for immunocompromised state.  Hematological: Does not bruise/bleed easily.      Allergies  Review of patient's allergies indicates no known allergies.  Home Medications   Prior to Admission medications   Medication Sig Start Date End Date Taking? Authorizing Provider  clonazePAM (KLONOPIN) 0.5 MG tablet Take 0.5 mg by mouth 2 (two) times daily as needed for anxiety.    Historical Provider, MD  doxycycline (VIBRAMYCIN) 100 MG capsule Take 1 capsule (100 mg total) by mouth 2 (two) times daily. 11/02/15   Varney Biles, MD  ibuprofen (ADVIL,MOTRIN) 600 MG tablet Take 1 tablet (600 mg total)  by mouth every 6 (six) hours as needed. 11/02/15   Jeanette Moffatt Kathrynn Humble, MD   BP 110/67 mmHg  Pulse 91  Temp(Src) 99.3 F (37.4 C) (Oral)  Resp 19  Ht 5\' 5"  (1.651 m)  Wt 285 lb 1 oz (129.304 kg)  BMI 47.44 kg/m2  SpO2 100%  LMP 10/03/2015 Physical Exam  Constitutional: She is oriented to person, place, and time. She appears well-developed.  HENT:  Head: Normocephalic and atraumatic.  Eyes: EOM are normal.  Neck: Normal range of motion. Neck supple.  Cardiovascular: Normal rate.   Pulmonary/Chest: Effort normal.  Abdominal: Soft. Bowel sounds are normal.  8 x 9 cm area of induration around the abdominal wall  Neurological: She is alert and oriented to person, place, and time.  Skin: Skin is warm and dry.  Nursing note and vitals reviewed.   ED Course  .Marland KitchenIncision and Drainage Date/Time: 11/02/2015 8:50 PM Performed by: Varney Biles Authorized by: Varney Biles Consent: Verbal consent obtained. Risks and benefits: risks, benefits and alternatives were discussed Consent given by: patient Site marked: the operative site was marked Required items: required blood products, implants, devices, and special equipment available Patient identity confirmed: arm band Type: abscess Body area: trunk Location details: abdomen Anesthesia: local infiltration Local anesthetic: lidocaine 2% with epinephrine Anesthetic total: 10 ml Patient sedated: no Risk factor: underlying major vessel Scalpel size: 10 Needle gauge: 18 Incision type: single straight Incision depth: subcutaneous Complexity: complex  Drainage: purulent Drainage amount: moderate Wound treatment: drain placed Packing material: 1/2 in iodoform gauze Patient tolerance: Patient tolerated the procedure well with no immediate complications Comments: About 40-50 cc of purulent drainage   (including critical care time)  EMERGENCY DEPARTMENT US SOFT TISSUE INTERPRETATION "Study: Limited Soft Tissue Ultrasound"  INDICATIONS:  Soft tissue infection Multiple views of the body part were obtained in real-time with a multi-frequency linear probe PERFORMED BY:  Myself IMAGES ARCHIVED?: Yes SIDE:Midline BODY PART:Abdominal wall FINDINGS: Abcess present INTERPRETATION:  Abcess present     Labs Review Labs Reviewed - No data to display  Imaging Review No results found. I have personally reviewed and evaluated these images and lab results as part of my medical decision-making.   EKG Interpretation None      MDM   Final diagnoses:  Abdominal wall abscess    Pt has an abd wall abscess, that was drained. Starter her on antibiotics. We had about 50 cc of purulent drainage that was suctioned.  F/U [facility] walk in clinic on Tuesday.    Varney Biles, MD 11/02/15 2053

## 2015-11-26 ENCOUNTER — Ambulatory Visit (INDEPENDENT_AMBULATORY_CARE_PROVIDER_SITE_OTHER): Payer: 59 | Admitting: Women's Health

## 2015-11-26 ENCOUNTER — Encounter: Payer: Self-pay | Admitting: Women's Health

## 2015-11-26 VITALS — BP 128/80 | Ht 65.0 in | Wt 281.0 lb

## 2015-11-26 DIAGNOSIS — B9689 Other specified bacterial agents as the cause of diseases classified elsewhere: Secondary | ICD-10-CM

## 2015-11-26 DIAGNOSIS — A499 Bacterial infection, unspecified: Secondary | ICD-10-CM | POA: Diagnosis not present

## 2015-11-26 DIAGNOSIS — N946 Dysmenorrhea, unspecified: Secondary | ICD-10-CM

## 2015-11-26 DIAGNOSIS — N898 Other specified noninflammatory disorders of vagina: Secondary | ICD-10-CM

## 2015-11-26 DIAGNOSIS — N760 Acute vaginitis: Secondary | ICD-10-CM

## 2015-11-26 DIAGNOSIS — Z01419 Encounter for gynecological examination (general) (routine) without abnormal findings: Secondary | ICD-10-CM | POA: Diagnosis not present

## 2015-11-26 LAB — WET PREP FOR TRICH, YEAST, CLUE
Trich, Wet Prep: NONE SEEN
Yeast Wet Prep HPF POC: NONE SEEN

## 2015-11-26 MED ORDER — METRONIDAZOLE 0.75 % VA GEL: VAGINAL | 0 refills | Status: DC

## 2015-11-26 NOTE — Addendum Note (Signed)
Addended by: Burnett Kanaris on: 11/26/2015 12:26 PM   Modules accepted: Orders

## 2015-11-26 NOTE — Progress Notes (Signed)
ANNORA WYSOCKI [birth year] [id number]    History:    Presents for annual exam.  Monthly 3-5 day cycles using no contraception partner low sperm count. Divorced but took husband back and they have been together again greater than one year with no breakouts. Husband has diabetes several toes amputated. Primary care manages labs, anxiety. Has lost 20 pounds in the past year with [employer] and diet. 2016 Pap was normal positive HR HPV -16, 18 and 45.  Past medical history, past surgical history, family history and social history were all reviewed and documented in the EPIC chart. Works from home for [employer]. Parents hypertension, father diabetes, aunt breast cancer.  ROS:  A ROS was performed and pertinent positives and negatives are included.  Exam:  Vitals:   11/26/15 0910  BP: 128/80    General appearance:  Normal Thyroid:  Symmetrical, normal in size, without palpable masses or nodularity. Respiratory  Auscultation:  Clear without wheezing or rhonchi Cardiovascular  Auscultation:  Regular rate, without rubs, murmurs or gallops  Edema/varicosities:  Not grossly evident Abdominal  Soft,nontender, without masses, guarding or rebound.  Liver/spleen:  No organomegaly noted  Hernia:  None appreciated  Skin  Inspection:  Grossly normal   Breasts: Examined lying and sitting.     Right: Without masses, retractions, discharge or axillary adenopathy.     Left: Without masses, retractions, discharge or axillary adenopathy. Gentitourinary   Inguinal/mons:  Normal without inguinal adenopathy  External genitalia:  Normal  BUS/Urethra/Skene's glands:  Normal  Vagina:  White adherent discharge with odor but prep positive for many clues, TNTC bacteria  Cervix:  Normal  Uterus:   normal in size, shape and contour.  Midline and mobile  Adnexa/parametria:     Rt: Without masses or tenderness.   Lt: Without masses or tenderness.  Anus and perineum: Normal  Digital rectal exam: Normal  sphincter tone without palpated masses or tenderness  Assessment/Plan:  35 y.o. DBF G0 for annual exam.    Monthly 3-5 day cycle/partner low sperm count Morbid obesity Bacteria vaginosis 2016 normal Pap, positive HR HPV with -16, 18 and 45  Plan: Declines contraception, pregnancy okay declines fertility intervention. MetroGel vaginal cream 1 applicator at bedtime 5, alcohol precautions reviewed. SBE's, continue exercise, decrease calories for weight loss. Calcium rich diet, low carb diet, MVI daily encouraged. Schedule ultrasound, limited exam, history of hirsutism. UA, Pap with HR HPV typing.    Huel Cote WHNP, 10:07 AM 11/26/2015

## 2015-11-27 LAB — URINALYSIS W MICROSCOPIC + REFLEX CULTURE
BILIRUBIN URINE: NEGATIVE
CRYSTALS: NONE SEEN [HPF]
Casts: NONE SEEN [LPF]
Glucose, UA: NEGATIVE
HGB URINE DIPSTICK: NEGATIVE
Ketones, ur: NEGATIVE
Leukocytes, UA: NEGATIVE
Nitrite: NEGATIVE
PROTEIN: NEGATIVE
Specific Gravity, Urine: 1.018 (ref 1.001–1.035)
Yeast: NONE SEEN [HPF]
pH: 5.5 (ref 5.0–8.0)

## 2015-11-28 LAB — PAP, TP IMAGING W/ HPV RNA, RFLX HPV TYPE 16,18/45: HPV MRNA, HIGH RISK: NOT DETECTED

## 2015-11-28 LAB — URINE CULTURE: Organism ID, Bacteria: 10000

## 2015-12-13 ENCOUNTER — Ambulatory Visit: Payer: 59

## 2016-02-03 ENCOUNTER — Other Ambulatory Visit: Payer: Self-pay | Admitting: *Deleted

## 2016-02-03 MED ORDER — METRONIDAZOLE 500 MG PO TABS: 500.0000 mg | ORAL_TABLET | Freq: Two times a day (BID) | ORAL | 0 refills | Status: DC

## 2016-02-03 NOTE — Telephone Encounter (Signed)
Ok, flagyl 500 mg po bid for 7 days #14. Review we have no idea what insurance covers, sorry for inconvenience .

## 2016-02-03 NOTE — Telephone Encounter (Signed)
Pt was prescribed metrogel on office visit 11/26/15, never picked up Rx, states she couldn't afford it at the time. Pt needs medication, but would prefer pill form. Please advise

## 2016-02-03 NOTE — Telephone Encounter (Signed)
Pt aware, Rx sent. 

## 2017-07-23 ENCOUNTER — Other Ambulatory Visit: Payer: Self-pay

## 2017-07-23 ENCOUNTER — Encounter (HOSPITAL_BASED_OUTPATIENT_CLINIC_OR_DEPARTMENT_OTHER): Payer: Self-pay

## 2017-07-23 ENCOUNTER — Emergency Department (HOSPITAL_BASED_OUTPATIENT_CLINIC_OR_DEPARTMENT_OTHER)
Admission: EM | Admit: 2017-07-23 | Discharge: 2017-07-23 | Disposition: A | Discharge status: Home / Self Care | Payer: Managed Care, Other (non HMO) | Attending: Emergency Medicine | Admitting: Emergency Medicine

## 2017-07-23 DIAGNOSIS — F410 Panic disorder [episodic paroxysmal anxiety] without agoraphobia: Secondary | ICD-10-CM

## 2017-07-23 DIAGNOSIS — R42 Dizziness and giddiness: Secondary | ICD-10-CM | POA: Diagnosis present

## 2017-07-23 DIAGNOSIS — R0602 Shortness of breath: Secondary | ICD-10-CM

## 2017-07-23 DIAGNOSIS — F419 Anxiety disorder, unspecified: Secondary | ICD-10-CM | POA: Diagnosis not present

## 2017-07-23 HISTORY — DX: Anxiety disorder, unspecified: F41.9

## 2017-07-23 LAB — TROPONIN I: Troponin I: <0.03 ng/mL (ref <0.03)

## 2017-07-23 LAB — BASIC METABOLIC PANEL
Anion gap: 11 (ref 5–15)
BUN: 11 mg/dL (ref 6–20)
CALCIUM: 9 mg/dL (ref 8.9–10.3)
CO2: 21 mmol/L — ABNORMAL LOW (ref 22–32)
CREATININE: 0.76 mg/dL (ref 0.44–1.00)
Chloride: 106 mmol/L (ref 101–111)
GFR calc non Af Amer: >60 mL/min (ref >60)
Glucose, Bld: 93 mg/dL (ref 65–99)
Potassium: 3.8 mmol/L (ref 3.5–5.1)
SODIUM: 138 mmol/L (ref 135–145)

## 2017-07-23 LAB — CBC
HCT: 34.6 % — ABNORMAL LOW (ref 36.0–46.0)
Hemoglobin: 11 g/dL — ABNORMAL LOW (ref 12.0–15.0)
MCH: 24 pg — ABNORMAL LOW (ref 26.0–34.0)
MCHC: 31.8 g/dL (ref 30.0–36.0)
MCV: 75.5 fL — ABNORMAL LOW (ref 78.0–100.0)
PLATELETS: 307 10*3/uL (ref 150–400)
RBC: 4.58 MIL/uL (ref 3.87–5.11)
RDW: 15.8 % — AB (ref 11.5–15.5)
WBC: 6.1 10*3/uL (ref 4.0–10.5)

## 2017-07-23 LAB — URINALYSIS, ROUTINE W REFLEX MICROSCOPIC
Bilirubin Urine: NEGATIVE
Glucose, UA: NEGATIVE mg/dL
Hgb urine dipstick: NEGATIVE
KETONES UR: NEGATIVE mg/dL
LEUKOCYTES UA: NEGATIVE
Nitrite: NEGATIVE
PROTEIN: NEGATIVE mg/dL
Specific Gravity, Urine: 1.02 (ref 1.005–1.030)
pH: 7 (ref 5.0–8.0)

## 2017-07-23 LAB — PREGNANCY, URINE: PREG TEST UR: NEGATIVE

## 2017-07-23 MED ORDER — LORAZEPAM 1 MG PO TABS
1.0000 mg | ORAL_TABLET | Freq: Once | ORAL | Status: AC
  Administered 2017-07-23: 1 mg via ORAL
  Filled 2017-07-23: qty 1

## 2017-07-23 NOTE — ED Notes (Signed)
ED Provider at bedside for plan of care; patient resting with no acute distress.

## 2017-07-23 NOTE — ED Provider Notes (Signed)
[facility] EMERGENCY DEPARTMENT Provider Note   CSN: [id number] Arrival date & time: 07/23/17  1439     History   Chief Complaint Chief Complaint  Patient presents with  . Weakness    HPI Joann [name] is a 37 y.o. female.  The history is provided by the patient and medical records. No language interpreter was used.   Joann [name] is a 37 y.o. female  with a PMH of anxiety who presents to the Emergency Department complaining of lightheaded and shortness of breath which began today.  Patient states that she started working again this week.  She has been very anxious and stressed starting a new job.  Today she felt as if her anxiety "overcame her".  She felt lightheaded and as if she was having difficulty breathing.  She sat down because she thought she might faint.  She started "hyperventilating".  A coworker came to her side and helped her with deep breathing while calling EMS.  She states that deep breathing helped and she currently has no complaints.  She denies any associated chest pain at that time.  Shortness of breath is now resolved.  She reports history of panic attacks in the past and today's episode feels very similar. No medications taken prior to arrival for symptoms. No syncopal episode. No recent illness.   Past Medical History:  Diagnosis Date  . Anxiety     Patient Active Problem List   Diagnosis Date Noted  . Morbid obesity ([place])     History reviewed. No pertinent surgical history.   OB History    Gravida  0   Para      Term      Preterm      AB      Living        SAB      TAB      Ectopic      Multiple      Live Births               Home Medications    Prior to Admission medications   Medication Sig Start Date End Date Taking? Authorizing Provider  ibuprofen (ADVIL,MOTRIN) 600 MG tablet Take 1 tablet (600 mg total) by mouth every 6 (six) hours as needed. 11/02/15   Varney Biles, MD  metroNIDAZOLE (FLAGYL) 500  MG tablet Take 1 tablet (500 mg total) by mouth 2 (two) times daily. 02/03/16   Huel Cote, NP  Multiple Vitamin (MULTI-VITAMINS) TABS Take 1 tablet by mouth every morning.    [provider]    Family History Family History  Problem Relation Age of Onset  . Hypertension Mother   . Hypertension Father     Social History Social History   Tobacco Use  . Smoking status: Never Smoker  . Smokeless tobacco: Never Used  Substance Use Topics  . Alcohol use: Yes    Alcohol/week: 0.0 oz    Comment: SOCIALLY  . Drug use: No     Allergies   Patient has no known allergies.   Review of Systems Review of Systems  Respiratory: Positive for shortness of breath. Negative for cough and wheezing.   Neurological: Positive for light-headedness. Negative for dizziness, syncope, speech difficulty, numbness and headaches.  Psychiatric/Behavioral:       + anxiety  All other systems reviewed and are negative.   Physical Exam Updated Vital Signs BP 129/86 (BP Location: Right Arm)   Pulse 97   Temp 98.4  F (36.9 C) (Oral)   Resp 16   Ht 5\' 5"  (1.651 m)   Wt 130.2 kg (287 lb)   LMP 06/29/2017 (Exact Date)   SpO2 100%   BMI 47.76 kg/m   Physical Exam  Constitutional: She is oriented to person, place, and time. She appears well-developed and well-nourished.  Resting comfortably in no acute distress.  HENT:  Head: Normocephalic and atraumatic.  Cardiovascular: Regular rhythm and normal heart sounds.  No murmur heard. Pulmonary/Chest: Effort normal and breath sounds normal. No respiratory distress.  Lungs CTA bilaterally. Speaking in full sentences without difficulty.  Abdominal: Soft. She exhibits no distension. There is no tenderness.  Musculoskeletal: Normal range of motion.  Neurological: She is alert and oriented to person, place, and time.  Skin: Skin is warm and dry.  Nursing note and vitals reviewed.    ED Treatments / Results  Labs (all labs ordered are  listed, but only abnormal results are displayed) Labs Reviewed  BASIC METABOLIC PANEL - Abnormal; Notable for the following components:      Result Value   CO2 21 (*)    All other components within normal limits  CBC - Abnormal; Notable for the following components:   Hemoglobin 11.0 (*)    HCT 34.6 (*)    MCV 75.5 (*)    MCH 24.0 (*)    RDW 15.8 (*)    All other components within normal limits  URINALYSIS, ROUTINE W REFLEX MICROSCOPIC - Abnormal; Notable for the following components:   APPearance CLOUDY (*)    All other components within normal limits  PREGNANCY, URINE  TROPONIN I    EKG EKG Interpretation  Date/Time:  Friday July 23 2017 15:01:25 EDT Ventricular Rate:  113 PR Interval:    QRS Duration: 90 QT Interval:  337 QTC Calculation: 462 R Axis:   52 Text Interpretation:  Sinus tachycardia Abnormal R-wave progression, early transition Borderline T wave abnormalities No significant change since last tracing Confirmed by Deno Etienne [phone]) on 07/23/2017 3:50:03 PM   Radiology No results found.  Procedures Procedures (including critical care time)  Medications Ordered in ED Medications  LORazepam (ATIVAN) tablet 1 mg (1 mg Oral Given 07/23/17 1526)     Initial Impression / Assessment and Plan / ED Course  I have reviewed the triage vital signs and the nursing notes.  Pertinent labs & imaging results that were available during my care of the patient were reviewed by me and considered in my medical decision making (see chart for details).    Joann [name] is a 37 y.o. female who presents to ED for weakness, shortness of breath c/w her typical panic attacks. She just started a new job and has been anxious over the last week. Today, she felt as if her anxiety over-came her and she began hyperventilating. Sxs improved with deep breathing. Upon arrival to ER, patient with no complaints. Her symptoms have now completely resolved. Labs / urine reviewed and  reassuring. EKG with no acute changes. Encouraged PCP follow up. Return precautions discussed. All questions answered.   Patient discussed with Dr. Gilford Raid who agrees with treatment plan.   Final Clinical Impressions(s) / ED Diagnoses   Final diagnoses:  Shortness of breath  Anxiety attack    ED Discharge Orders    None       Daenerys Buttram, Ozella Almond, PA-C 07/23/17 1628    Isla Pence, MD 07/24/17 1455

## 2017-07-23 NOTE — Discharge Instructions (Signed)
It was my pleasure taking care of you today!   Fortunately, your labs and EKG today were very reassuring.   Please follow up with a primary care doctor for further discussion of today's hospital visit as we discussed.   Return to ER for chest pain, return of shortness of breath, new or worsening symptoms, any additional concerns.

## 2017-07-23 NOTE — ED Triage Notes (Signed)
Pt arrived via [facility]. Pt had a period of lightheadedness while at work. EMS reports they found pt sitting in the floor hyperventilating. Pt states she became near-syncopal and which made her anxious.   Pt states she has had some generalized weakness  x1 week. Pt states she only has the generalized weakness at this time.

## 2017-07-23 NOTE — ED Notes (Signed)
Pt in NAD at this time. Pt denies any complaints. Pt reports having "a panic attack earlier at work where she was lightheaded and SOB". Pt reports hx of panic attacks. Pt denies SOB/CP at this time. Pt speaking in clear complete sentences with no dyspnea.

## 2017-09-29 ENCOUNTER — Encounter: Payer: Managed Care, Other (non HMO) | Admitting: Women's Health

## 2018-01-18 ENCOUNTER — Ambulatory Visit (INDEPENDENT_AMBULATORY_CARE_PROVIDER_SITE_OTHER): Payer: BLUE CROSS/BLUE SHIELD | Admitting: Women's Health

## 2018-01-18 ENCOUNTER — Encounter: Payer: Self-pay | Admitting: Women's Health

## 2018-01-18 VITALS — BP 126/78 | Ht 65.0 in | Wt 291.0 lb

## 2018-01-18 DIAGNOSIS — Z01419 Encounter for gynecological examination (general) (routine) without abnormal findings: Secondary | ICD-10-CM | POA: Diagnosis not present

## 2018-01-18 NOTE — Progress Notes (Signed)
Joann [name] 11-02-1980 [id number]    History:    Presents for annual exam.  Regular monthly 3 to 5 days cycle using no contraception.  Has seen Dr. Rolin Barry in the past year but needs appointments on Mondays and he is not available.  No problematic labs were noted with infertility work-up.  Husband has low sperm count, has had numerous toe and recently had right leg amputation from diabetes complications.  Would like to proceed with donor insemination.  Normal Pap history.  Past medical history, past surgical history, family history and social history were all reviewed and documented in the EPIC chart.  Works for an [employer].  Currently living with her sister-in-law due to financial hardship has been on disability.  Parents hypertension, father diabetes.  ROS:  A ROS was performed and pertinent positives and negatives are included.  Exam:  Vitals:   01/18/18 1457  BP: 126/78  Weight: 291 lb (132 kg)  Height: 5\' 5"  (1.651 m)   Body mass index is 48.42 kg/m.   General appearance: Obese Thyroid:  Symmetrical, normal in size, without palpable masses or nodularity. Respiratory  Auscultation:  Clear without wheezing or rhonchi Cardiovascular  Auscultation:  Regular rate, without rubs, murmurs or gallops  Edema/varicosities:  Not grossly evident Abdominal  Soft,nontender, without masses, guarding or rebound.  Liver/spleen:  No organomegaly noted  Hernia:  None appreciated  Skin  Inspection:  Grossly normal   Breasts: Examined lying and sitting.     Right: Without masses, retractions, discharge or axillary adenopathy.     Left: Without masses, retractions, discharge or axillary adenopathy. Gentitourinary   Inguinal/mons:  Normal without inguinal adenopathy  External genitalia:  Normal  BUS/Urethra/Skene's glands:  Normal  Vagina:  Normal  Cervix:  Normal  Uterus:  normal in size, shape and contour.  Midline and mobile  Adnexa/parametria:     Rt: Without masses or  tenderness.   Lt: Without masses or tenderness.  Anus and perineum: Normal  Digital rectal exam: Normal sphincter tone without palpated masses or tenderness  Assessment/Plan:  37 y.o. MB F G0 for annual exam and conception.    Regular monthly 3 to 5-day cycle Morbid obesity Situational stress- living with sister-in-law, husband disability diabetes complication with amputation  Plan: Dr. Charlett Lango number given instructed to schedule appointment there for possible donor insemination.  Reviewed fertility does decrease with age, options of fostering to adopt also discussed.  Best to increase exercise decrease calories/carbs for weight loss.  SBE's, calcium rich foods, prenatal vitamin daily, safe pregnancy behaviors reviewed.  Leisure activities encouraged.  CBC, glucose, Pap normal with negative HR HPV 2017, new screening guidelines reviewed.      [place], 4:47 PM 01/18/2018

## 2018-01-18 NOTE — Patient Instructions (Addendum)
Dr Kerin Perna   [phone]    Carbohydrate Counting for Diabetes Mellitus, Adult Carbohydrate counting is a method for keeping track of how many carbohydrates you eat. Eating carbohydrates naturally increases the amount of sugar (glucose) in the blood. Counting how many carbohydrates you eat helps keep your blood glucose within normal limits, which helps you manage your diabetes (diabetes mellitus). It is important to know how many carbohydrates you can safely have in each meal. This is different for every person. A diet and nutrition specialist (registered dietitian) can help you make a meal plan and calculate how many carbohydrates you should have at each meal and snack. Carbohydrates are found in the following foods:  Grains, such as breads and cereals.  Dried beans and soy products.  Starchy vegetables, such as potatoes, peas, and corn.  Fruit and fruit juices.  Milk and yogurt.  Sweets and snack foods, such as cake, cookies, candy, chips, and soft drinks.  How do I count carbohydrates? There are two ways to count carbohydrates in food. You can use either of the methods or a combination of both. Reading "Nutrition Facts" on packaged food The "Nutrition Facts" list is included on the labels of almost all packaged foods and beverages in the [place] It includes:  The serving size.  Information about nutrients in each serving, including the grams (g) of carbohydrate per serving.  To use the "Nutrition Facts":  Decide how many servings you will have.  Multiply the number of servings by the number of carbohydrates per serving.  The resulting number is the total amount of carbohydrates that you will be having.  Learning standard serving sizes of other foods When you eat foods containing carbohydrates that are not packaged or do not include "Nutrition Facts" on the label, you need to measure the servings in order to count the amount of carbohydrates:  Measure the foods that you will  eat with a food scale or measuring cup, if needed.  Decide how many standard-size servings you will eat.  Multiply the number of servings by 15. Most carbohydrate-rich foods have about 15 g of carbohydrates per serving. ? For example, if you eat 8 oz (170 g) of strawberries, you will have eaten 2 servings and 30 g of carbohydrates (2 servings x 15 g = 30 g).  For foods that have more than one food mixed, such as soups and casseroles, you must count the carbohydrates in each food that is included.  The following list contains standard serving sizes of common carbohydrate-rich foods. Each of these servings has about 15 g of carbohydrates:   hamburger bun or  English muffin.   oz (15 mL) syrup.   oz (14 g) jelly.  1 slice of bread.  1 six-inch tortilla.  3 oz (85 g) cooked rice or pasta.  4 oz (113 g) cooked dried beans.  4 oz (113 g) starchy vegetable, such as peas, corn, or potatoes.  4 oz (113 g) hot cereal.  4 oz (113 g) mashed potatoes or  of a large baked potato.  4 oz (113 g) canned or frozen fruit.  4 oz (120 mL) fruit juice.  4-6 crackers.  6 chicken nuggets.  6 oz (170 g) unsweetened dry cereal.  6 oz (170 g) plain fat-free yogurt or yogurt sweetened with artificial sweeteners.  8 oz (240 mL) milk.  8 oz (170 g) fresh fruit or one small piece of fruit.  24 oz (680 g) popped popcorn.  Example of carbohydrate counting Sample  meal  3 oz (85 g) chicken breast.  6 oz (170 g) brown rice.  4 oz (113 g) corn.  8 oz (240 mL) milk.  8 oz (170 g) strawberries with sugar-free whipped topping. Carbohydrate calculation 1. Identify the foods that contain carbohydrates: ? Rice. ? Corn. ? Milk. ? Strawberries. 2. Calculate how many servings you have of each food: ? 2 servings rice. ? 1 serving corn. ? 1 serving milk. ? 1 serving strawberries. 3. Multiply each number of servings by 15 g: ? 2 servings rice x 15 g = 30 g. ? 1 serving corn x 15 g = 15  g. ? 1 serving milk x 15 g = 15 g. ? 1 serving strawberries x 15 g = 15 g. 4. Add together all of the amounts to find the total grams of carbohydrates eaten: ? 30 g + 15 g + 15 g + 15 g = 75 g of carbohydrates total. This information is not intended to replace advice given to you by your health care provider. Make sure you discuss any questions you have with your health care provider. Document Released: 04/20/2005 Document Revised: 11/08/2015 Document Reviewed: 10/02/2015 Elsevier Interactive Patient Education  2018 [place] Maintenance, Female Adopting a healthy lifestyle and getting preventive care can go a long way to promote health and wellness. Talk with your health care provider about what schedule of regular examinations is right for you. This is a good chance for you to check in with your provider about disease prevention and staying healthy. In between checkups, there are plenty of things you can do on your own. Experts have done a lot of research about which lifestyle changes and preventive measures are most likely to keep you healthy. Ask your health care provider for more information. Weight and diet Eat a healthy diet  Be sure to include plenty of vegetables, fruits, low-fat dairy products, and lean protein.  Do not eat a lot of foods high in solid fats, added sugars, or salt.  Get regular exercise. This is one of the most important things you can do for your health. ? Most adults should exercise for at least 150 minutes each week. The exercise should increase your heart rate and make you sweat (moderate-intensity exercise). ? Most adults should also do strengthening exercises at least twice a week. This is in addition to the moderate-intensity exercise.  Maintain a healthy weight  Body mass index (BMI) is a measurement that can be used to identify possible weight problems. It estimates body fat based on height and weight. Your health care provider can help determine  your BMI and help you achieve or maintain a healthy weight.  For females 13 years of age and older: ? A BMI below 18.5 is considered underweight. ? A BMI of 18.5 to 24.9 is normal. ? A BMI of 25 to 29.9 is considered overweight. ? A BMI of 30 and above is considered obese.  Watch levels of cholesterol and blood lipids  You should start having your blood tested for lipids and cholesterol at 38 years of age, then have this test every 5 years.  You may need to have your cholesterol levels checked more often if: ? Your lipid or cholesterol levels are high. ? You are older than 38 years of age. ? You are at high risk for heart disease.  Cancer screening Lung Cancer  Lung cancer screening is recommended for adults 37-13 years old who are at high risk for lung  cancer because of a history of smoking.  A yearly low-dose CT scan of the lungs is recommended for people who: ? Currently smoke. ? Have quit within the past 15 years. ? Have at least a 30-pack-year history of smoking. A pack year is smoking an average of one pack of cigarettes a day for 1 year.  Yearly screening should continue until it has been 15 years since you quit.  Yearly screening should stop if you develop a health problem that would prevent you from having lung cancer treatment.  Breast Cancer  Practice breast self-awareness. This means understanding how your breasts normally appear and feel.  It also means doing regular breast self-exams. Let your health care provider know about any changes, no matter how small.  If you are in your 20s or 30s, you should have a clinical breast exam (CBE) by a health care provider every 1-3 years as part of a regular health exam.  If you are 54 or older, have a CBE every year. Also consider having a breast X-ray (mammogram) every year.  If you have a family history of breast cancer, talk to your health care provider about genetic screening.  If you are at high risk for breast  cancer, talk to your health care provider about having an MRI and a mammogram every year.  Breast cancer gene (BRCA) assessment is recommended for women who have family members with BRCA-related cancers. BRCA-related cancers include: ? Breast. ? Ovarian. ? Tubal. ? Peritoneal cancers.  Results of the assessment will determine the need for genetic counseling and BRCA1 and BRCA2 testing.  Cervical Cancer Your health care provider may recommend that you be screened regularly for cancer of the pelvic organs (ovaries, uterus, and vagina). This screening involves a pelvic examination, including checking for microscopic changes to the surface of your cervix (Pap test). You may be encouraged to have this screening done every 3 years, beginning at age 40.  For women ages 42-65, health care providers may recommend pelvic exams and Pap testing every 3 years, or they may recommend the Pap and pelvic exam, combined with testing for human papilloma virus (HPV), every 5 years. Some types of HPV increase your risk of cervical cancer. Testing for HPV may also be done on women of any age with unclear Pap test results.  Other health care providers may not recommend any screening for nonpregnant women who are considered low risk for pelvic cancer and who do not have symptoms. Ask your health care provider if a screening pelvic exam is right for you.  If you have had past treatment for cervical cancer or a condition that could lead to cancer, you need Pap tests and screening for cancer for at least 20 years after your treatment. If Pap tests have been discontinued, your risk factors (such as having a new sexual partner) need to be reassessed to determine if screening should resume. Some women have medical problems that increase the chance of getting cervical cancer. In these cases, your health care provider may recommend more frequent screening and Pap tests.  Colorectal Cancer  This type of cancer can be detected  and often prevented.  Routine colorectal cancer screening usually begins at 38 years of age and continues through 38 years of age.  Your health care provider may recommend screening at an earlier age if you have risk factors for colon cancer.  Your health care provider may also recommend using home test kits to check for hidden blood in the  stool.  A small camera at the end of a tube can be used to examine your colon directly (sigmoidoscopy or colonoscopy). This is done to check for the earliest forms of colorectal cancer.  Routine screening usually begins at age 73.  Direct examination of the colon should be repeated every 5-10 years through 38 years of age. However, you may need to be screened more often if early forms of precancerous polyps or small growths are found.  Skin Cancer  Check your skin from head to toe regularly.  Tell your health care provider about any new moles or changes in moles, especially if there is a change in a mole's shape or color.  Also tell your health care provider if you have a mole that is larger than the size of a pencil eraser.  Always use sunscreen. Apply sunscreen liberally and repeatedly throughout the day.  Protect yourself by wearing long sleeves, pants, a wide-brimmed hat, and sunglasses whenever you are outside.  Heart disease, diabetes, and high blood pressure  High blood pressure causes heart disease and increases the risk of stroke. High blood pressure is more likely to develop in: ? People who have blood pressure in the high end of the normal range (130-139/85-89 mm Hg). ? People who are overweight or obese. ? People who are African American.  If you are 72-84 years of age, have your blood pressure checked every 3-5 years. If you are 98 years of age or older, have your blood pressure checked every year. You should have your blood pressure measured twice-once when you are at a hospital or clinic, and once when you are not at a hospital or  clinic. Record the average of the two measurements. To check your blood pressure when you are not at a hospital or clinic, you can use: ? An automated blood pressure machine at a pharmacy. ? A home blood pressure monitor.  If you are between 38 years and 69 years old, ask your health care provider if you should take aspirin to prevent strokes.  Have regular diabetes screenings. This involves taking a blood sample to check your fasting blood sugar level. ? If you are at a normal weight and have a low risk for diabetes, have this test once every three years after 38 years of age. ? If you are overweight and have a high risk for diabetes, consider being tested at a younger age or more often. Preventing infection Hepatitis B  If you have a higher risk for hepatitis B, you should be screened for this virus. You are considered at high risk for hepatitis B if: ? You were born in a country where hepatitis B is common. Ask your health care provider which countries are considered high risk. ? Your parents were born in a high-risk country, and you have not been immunized against hepatitis B (hepatitis B vaccine). ? You have HIV or AIDS. ? You use needles to inject street drugs. ? You live with someone who has hepatitis B. ? You have had sex with someone who has hepatitis B. ? You get hemodialysis treatment. ? You take certain medicines for conditions, including cancer, organ transplantation, and autoimmune conditions.  Hepatitis C  Blood testing is recommended for: ? Everyone born from [birth year] through 1966. ? Anyone with known risk factors for hepatitis C.  Sexually transmitted infections (STIs)  You should be screened for sexually transmitted infections (STIs) including gonorrhea and chlamydia if: ? You are sexually active and are younger than  38 years of age. ? You are older than 38 years of age and your health care provider tells you that you are at risk for this type of infection. ? Your  sexual activity has changed since you were last screened and you are at an increased risk for chlamydia or gonorrhea. Ask your health care provider if you are at risk.  If you do not have HIV, but are at risk, it may be recommended that you take a prescription medicine daily to prevent HIV infection. This is called pre-exposure prophylaxis (PrEP). You are considered at risk if: ? You are sexually active and do not regularly use condoms or know the HIV status of your partner(s). ? You take drugs by injection. ? You are sexually active with a partner who has HIV.  Talk with your health care provider about whether you are at high risk of being infected with HIV. If you choose to begin PrEP, you should first be tested for HIV. You should then be tested every 3 months for as long as you are taking PrEP. Pregnancy  If you are premenopausal and you may become pregnant, ask your health care provider about preconception counseling.  If you may become pregnant, take 400 to 800 micrograms (mcg) of folic acid every day.  If you want to prevent pregnancy, talk to your health care provider about birth control (contraception). Osteoporosis and menopause  Osteoporosis is a disease in which the bones lose minerals and strength with aging. This can result in serious bone fractures. Your risk for osteoporosis can be identified using a bone density scan.  If you are 45 years of age or older, or if you are at risk for osteoporosis and fractures, ask your health care provider if you should be screened.  Ask your health care provider whether you should take a calcium or vitamin D supplement to lower your risk for osteoporosis.  Menopause may have certain physical symptoms and risks.  Hormone replacement therapy may reduce some of these symptoms and risks. Talk to your health care provider about whether hormone replacement therapy is right for you. Follow these instructions at home:  Schedule regular health,  dental, and eye exams.  Stay current with your immunizations.  Do not use any tobacco products including cigarettes, chewing tobacco, or electronic cigarettes.  If you are pregnant, do not drink alcohol.  If you are breastfeeding, limit how much and how often you drink alcohol.  Limit alcohol intake to no more than 1 drink per day for nonpregnant women. One drink equals 12 ounces of beer, 5 ounces of wine, or 1 ounces of hard liquor.  Do not use street drugs.  Do not share needles.  Ask your health care provider for help if you need support or information about quitting drugs.  Tell your health care provider if you often feel depressed.  Tell your health care provider if you have ever been abused or do not feel safe at home. This information is not intended to replace advice given to you by your health care provider. Make sure you discuss any questions you have with your health care provider. Document Released: 11/03/2010 Document Revised: 09/26/2015 Document Reviewed: 01/22/2015 Elsevier Interactive Patient Education  [employer].

## 2018-01-19 LAB — CBC WITH DIFFERENTIAL/PLATELET
BASOS ABS: 52 {cells}/uL (ref 0–200)
BASOS PCT: 0.8 %
EOS ABS: 163 {cells}/uL (ref 15–500)
EOS PCT: 2.5 %
HCT: 34.6 % — ABNORMAL LOW (ref 35.0–45.0)
HEMOGLOBIN: 10.5 g/dL — AB (ref 11.7–15.5)
Lymphs Abs: 2080 cells/uL (ref 850–3900)
MCH: 22.9 pg — AB (ref 27.0–33.0)
MCHC: 30.3 g/dL — AB (ref 32.0–36.0)
MCV: 75.5 fL — ABNORMAL LOW (ref 80.0–100.0)
MONOS PCT: 9.6 %
MPV: 11.8 fL (ref 7.5–12.5)
NEUTROS ABS: 3582 {cells}/uL (ref 1500–7800)
Neutrophils Relative %: 55.1 %
Platelets: 372 10*3/uL (ref 140–400)
RBC: 4.58 10*6/uL (ref 3.80–5.10)
RDW: 16 % — ABNORMAL HIGH (ref 11.0–15.0)
Total Lymphocyte: 32 %
WBC mixed population: 624 cells/uL (ref 200–950)
WBC: 6.5 10*3/uL (ref 3.8–10.8)

## 2018-01-19 LAB — GLUCOSE, RANDOM: GLUCOSE: 80 mg/dL (ref 65–99)

## 2018-05-04 HISTORY — PX: MYOMECTOMY: SHX85

## 2019-01-18 ENCOUNTER — Ambulatory Visit (INDEPENDENT_AMBULATORY_CARE_PROVIDER_SITE_OTHER): Payer: 59 | Admitting: Gynecology

## 2019-01-18 ENCOUNTER — Other Ambulatory Visit: Payer: Self-pay

## 2019-01-18 ENCOUNTER — Encounter: Payer: Self-pay | Admitting: Gynecology

## 2019-01-18 VITALS — BP 122/84

## 2019-01-18 DIAGNOSIS — D259 Leiomyoma of uterus, unspecified: Secondary | ICD-10-CM

## 2019-01-18 NOTE — Progress Notes (Signed)
    Joann [name] 11-May-1980 [id number]        38 y.o.  G0P0 presents to discuss possible surgery.  In review of her history she has been actively followed at [facility] and has an open myomectomy with hysteroscopic myomectomy scheduled the end of this month.  This apparently was rescheduled from earlier this month.  The patient was hoping that she could have the surgery done sooner.  Past medical history,surgical history, problem list, medications, allergies, family history and social history were all reviewed and documented in the EPIC chart.  Directed ROS with pertinent positives and negatives documented in the history of present illness/assessment and plan.  Exam: Vitals:   01/18/19 0948  BP: 122/84   General appearance:  Normal   Assessment/Plan:  38 y.o. G0P0 with planned surgery at [facility] in 2 weeks.  She was hoping to have the surgery done sooner here.  I reviewed with her that she really needs to follow-up with her physicians that are actively taking care of her at [facility] as they know her, have studied with ultrasounds and MRI  and have counseled her for the surgery.  She agrees with this plan.    Anastasio Auerbach MD, 10:08 AM 01/18/2019

## 2019-01-18 NOTE — Patient Instructions (Addendum)
Follow-up with [facility] for planned surgery as already scheduled

## 2019-01-23 ENCOUNTER — Encounter: Payer: BLUE CROSS/BLUE SHIELD | Admitting: Women's Health

## 2019-01-24 ENCOUNTER — Encounter: Payer: Self-pay | Admitting: Gynecology

## 2019-05-05 HISTORY — PX: HYSTEROSCOPY: SHX211

## 2019-07-04 ENCOUNTER — Other Ambulatory Visit: Payer: Self-pay

## 2019-07-05 ENCOUNTER — Ambulatory Visit (INDEPENDENT_AMBULATORY_CARE_PROVIDER_SITE_OTHER): Payer: 59 | Admitting: Women's Health

## 2019-07-05 ENCOUNTER — Encounter: Payer: Self-pay | Admitting: Women's Health

## 2019-07-05 VITALS — BP 132/80 | Ht 64.0 in | Wt 271.0 lb

## 2019-07-05 DIAGNOSIS — Z01419 Encounter for gynecological examination (general) (routine) without abnormal findings: Secondary | ICD-10-CM

## 2019-07-05 NOTE — Progress Notes (Signed)
Joann [name] 12-01-1980 [id number]    History:    Presents for annual exam.  Monthly 3 to 5-day cycle.  Normal Pap history.  Long-term history of infertility, fertility management managing myomectomy 01/2019.  Is scheduled for repeat myomectomy with polypectomy 08/2019.  Has had donor insemination with no pregnancy.  Husband diabetic has had 1 leg amputated and has a low sperm count.  Is in the process of losing weight and is hoping to proceed with IVF.  Past medical history, past surgical history, family history and social history were all reviewed and documented in the EPIC chart.  Works for an [employer].  ROS:  A ROS was performed and pertinent positives and negatives are included.  Exam:  Vitals:   07/05/19 1047  BP: 132/80  Weight: 271 lb (122.9 kg)  Height: 5\' 4"  (1.626 m)   Body mass index is 46.52 kg/m.   General appearance:  Normal Thyroid:  Symmetrical, normal in size, without palpable masses or nodularity. Respiratory  Auscultation:  Clear without wheezing or rhonchi Cardiovascular  Auscultation:  Regular rate, without rubs, murmurs or gallops  Edema/varicosities:  Not grossly evident Abdominal  Soft,nontender, without masses, guarding or rebound.  Liver/spleen:  No organomegaly noted  Hernia:  None appreciated  Skin  Inspection:  Grossly normal   Breasts: Examined lying and sitting.     Right: Without masses, retractions, discharge or axillary adenopathy.     Left: Without masses, retractions, discharge or axillary adenopathy. Gentitourinary   Inguinal/mons:  Normal without inguinal adenopathy  External genitalia:  Normal  BUS/Urethra/Skene's glands:  Normal  Vagina:  Normal  Cervix:  Normal  Uterus: normal in size, shape and contour.  Midline and mobile  Adnexa/parametria:     Rt: Without masses or tenderness.   Lt: Without masses or tenderness.  Anus and perineum: Normal  Digital rectal exam: Normal sphincter tone without palpated masses or  tenderness  Assessment/Plan:  39 y.o. MBF G0 for annual exam.  Monthly 3 to 5-day cycle desiring conception Fibroid uterus scheduled for myomectomy with polypectomy 08/2019 with fertility management- labs Obesity  Plan: Is in the process of trying to get weight less than 240 and is planning to proceed with IVF treatment after myomectomy.  Continue multivitamin daily.  Encouraged low calorie/carb diet, regular daily walking, calcium rich foods.  SBEs, screening mammogram at 40 if not pregnant.  Pap normal 2017 Pap with HR HPV typing, new screening guidelines reviewed.   [place], 10:58 AM 07/05/2019

## 2019-07-05 NOTE — Patient Instructions (Addendum)
MVI daily Good to see you today Health Maintenance, Female Adopting a healthy lifestyle and getting preventive care are important in promoting health and wellness. Ask your health care provider about:  The right schedule for you to have regular tests and exams.  Things you can do on your own to prevent diseases and keep yourself healthy. What should I know about diet, weight, and exercise? Eat a healthy diet   Eat a diet that includes plenty of vegetables, fruits, low-fat dairy products, and lean protein.  Do not eat a lot of foods that are high in solid fats, added sugars, or sodium. Maintain a healthy weight Body mass index (BMI) is used to identify weight problems. It estimates body fat based on height and weight. Your health care provider can help determine your BMI and help you achieve or maintain a healthy weight. Get regular exercise Get regular exercise. This is one of the most important things you can do for your health. Most adults should:  Exercise for at least 150 minutes each week. The exercise should increase your heart rate and make you sweat (moderate-intensity exercise).  Do strengthening exercises at least twice a week. This is in addition to the moderate-intensity exercise.  Spend less time sitting. Even light physical activity can be beneficial. Watch cholesterol and blood lipids Have your blood tested for lipids and cholesterol at 40 years of age, then have this test every 5 years. Have your cholesterol levels checked more often if:  Your lipid or cholesterol levels are high.  You are older than 40 years of age.  You are at high risk for heart disease. What should I know about cancer screening? Depending on your health history and family history, you may need to have cancer screening at various ages. This may include screening for:  Breast cancer.  Cervical cancer.  Colorectal cancer.  Skin cancer.  Lung cancer. What should I know about heart  disease, diabetes, and high blood pressure? Blood pressure and heart disease  High blood pressure causes heart disease and increases the risk of stroke. This is more likely to develop in people who have high blood pressure readings, are of African descent, or are overweight.  Have your blood pressure checked: ? Every 3-5 years if you are 61-97 years of age. ? Every year if you are 48 years old or older. Diabetes Have regular diabetes screenings. This checks your fasting blood sugar level. Have the screening done:  Once every three years after age 81 if you are at a normal weight and have a low risk for diabetes.  More often and at a younger age if you are overweight or have a high risk for diabetes. What should I know about preventing infection? Hepatitis B If you have a higher risk for hepatitis B, you should be screened for this virus. Talk with your health care provider to find out if you are at risk for hepatitis B infection. Hepatitis C Testing is recommended for:  Everyone born from [birth year] through 1966.  Anyone with known risk factors for hepatitis C. Sexually transmitted infections (STIs)  Get screened for STIs, including gonorrhea and chlamydia, if: ? You are sexually active and are younger than 40 years of age. ? You are older than 40 years of age and your health care provider tells you that you are at risk for this type of infection. ? Your sexual activity has changed since you were last screened, and you are at increased risk for chlamydia or  gonorrhea. Ask your health care provider if you are at risk.  Ask your health care provider about whether you are at high risk for HIV. Your health care provider may recommend a prescription medicine to help prevent HIV infection. If you choose to take medicine to prevent HIV, you should first get tested for HIV. You should then be tested every 3 months for as long as you are taking the medicine. Pregnancy  If you are about to stop  having your period (premenopausal) and you may become pregnant, seek counseling before you get pregnant.  Take 400 to 800 micrograms (mcg) of folic acid every day if you become pregnant.  Ask for birth control (contraception) if you want to prevent pregnancy. Osteoporosis and menopause Osteoporosis is a disease in which the bones lose minerals and strength with aging. This can result in bone fractures. If you are 41 years old or older, or if you are at risk for osteoporosis and fractures, ask your health care provider if you should:  Be screened for bone loss.  Take a calcium or vitamin D supplement to lower your risk of fractures.  Be given hormone replacement therapy (HRT) to treat symptoms of menopause. Follow these instructions at home: Lifestyle  Do not use any products that contain nicotine or tobacco, such as cigarettes, e-cigarettes, and chewing tobacco. If you need help quitting, ask your health care provider.  Do not use street drugs.  Do not share needles.  Ask your health care provider for help if you need support or information about quitting drugs. Alcohol use  Do not drink alcohol if: ? Your health care provider tells you not to drink. ? You are pregnant, may be pregnant, or are planning to become pregnant.  If you drink alcohol: ? Limit how much you use to 0-1 drink a day. ? Limit intake if you are breastfeeding.  Be aware of how much alcohol is in your drink. In the [place] one drink equals one 12 oz bottle of beer (355 mL), one 5 oz glass of wine (148 mL), or one 1 oz glass of hard liquor (44 mL). General instructions  Schedule regular health, dental, and eye exams.  Stay current with your vaccines.  Tell your health care provider if: ? You often feel depressed. ? You have ever been abused or do not feel safe at home. Summary  Adopting a healthy lifestyle and getting preventive care are important in promoting health and wellness.  Follow your health  care provider's instructions about healthy diet, exercising, and getting tested or screened for diseases.  Follow your health care provider's instructions on monitoring your cholesterol and blood pressure. This information is not intended to replace advice given to you by your health care provider. Make sure you discuss any questions you have with your health care provider. Document Revised: 04/13/2018 Document Reviewed: 04/13/2018 Elsevier Patient Education  2020 [place] for Diabetes Mellitus, Adult  Carbohydrate counting is a method of keeping track of how many carbohydrates you eat. Eating carbohydrates naturally increases the amount of sugar (glucose) in the blood. Counting how many carbohydrates you eat helps keep your blood glucose within normal limits, which helps you manage your diabetes (diabetes mellitus). It is important to know how many carbohydrates you can safely have in each meal. This is different for every person. A diet and nutrition specialist (registered dietitian) can help you make a meal plan and calculate how many carbohydrates you should have at each meal  and snack. Carbohydrates are found in the following foods:  Grains, such as breads and cereals.  Dried beans and soy products.  Starchy vegetables, such as potatoes, peas, and corn.  Fruit and fruit juices.  Milk and yogurt.  Sweets and snack foods, such as cake, cookies, candy, chips, and soft drinks. How do I count carbohydrates? There are two ways to count carbohydrates in food. You can use either of the methods or a combination of both. Reading "Nutrition Facts" on packaged food The "Nutrition Facts" list is included on the labels of almost all packaged foods and beverages in the [place] It includes:  The serving size.  Information about nutrients in each serving, including the grams (g) of carbohydrate per serving. To use the "Nutrition Facts":  Decide how many servings you  will have.  Multiply the number of servings by the number of carbohydrates per serving.  The resulting number is the total amount of carbohydrates that you will be having. Learning standard serving sizes of other foods When you eat carbohydrate foods that are not packaged or do not include "Nutrition Facts" on the label, you need to measure the servings in order to count the amount of carbohydrates:  Measure the foods that you will eat with a food scale or measuring cup, if needed.  Decide how many standard-size servings you will eat.  Multiply the number of servings by 15. Most carbohydrate-rich foods have about 15 g of carbohydrates per serving. ? For example, if you eat 8 oz (170 g) of strawberries, you will have eaten 2 servings and 30 g of carbohydrates (2 servings x 15 g = 30 g).  For foods that have more than one food mixed, such as soups and casseroles, you must count the carbohydrates in each food that is included. The following list contains standard serving sizes of common carbohydrate-rich foods. Each of these servings has about 15 g of carbohydrates:   hamburger bun or  English muffin.   oz (15 mL) syrup.   oz (14 g) jelly.  1 slice of bread.  1 six-inch tortilla.  3 oz (85 g) cooked rice or pasta.  4 oz (113 g) cooked dried beans.  4 oz (113 g) starchy vegetable, such as peas, corn, or potatoes.  4 oz (113 g) hot cereal.  4 oz (113 g) mashed potatoes or  of a large baked potato.  4 oz (113 g) canned or frozen fruit.  4 oz (120 mL) fruit juice.  4-6 crackers.  6 chicken nuggets.  6 oz (170 g) unsweetened dry cereal.  6 oz (170 g) plain fat-free yogurt or yogurt sweetened with artificial sweeteners.  8 oz (240 mL) milk.  8 oz (170 g) fresh fruit or one small piece of fruit.  24 oz (680 g) popped popcorn. Example of carbohydrate counting Sample meal  3 oz (85 g) chicken breast.  6 oz (170 g) brown rice.  4 oz (113 g) corn.  8 oz (240 mL)  milk.  8 oz (170 g) strawberries with sugar-free whipped topping. Carbohydrate calculation 1. Identify the foods that contain carbohydrates: ? Rice. ? Corn. ? Milk. ? Strawberries. 2. Calculate how many servings you have of each food: ? 2 servings rice. ? 1 serving corn. ? 1 serving milk. ? 1 serving strawberries. 3. Multiply each number of servings by 15 g: ? 2 servings rice x 15 g = 30 g. ? 1 serving corn x 15 g = 15 g. ? 1 serving milk  x 15 g = 15 g. ? 1 serving strawberries x 15 g = 15 g. 4. Add together all of the amounts to find the total grams of carbohydrates eaten: ? 30 g + 15 g + 15 g + 15 g = 75 g of carbohydrates total. Summary  Carbohydrate counting is a method of keeping track of how many carbohydrates you eat.  Eating carbohydrates naturally increases the amount of sugar (glucose) in the blood.  Counting how many carbohydrates you eat helps keep your blood glucose within normal limits, which helps you manage your diabetes.  A diet and nutrition specialist (registered dietitian) can help you make a meal plan and calculate how many carbohydrates you should have at each meal and snack. This information is not intended to replace advice given to you by your health care provider. Make sure you discuss any questions you have with your health care provider. Document Revised: 11/12/2016 Document Reviewed: 10/02/2015 Elsevier Patient Education  [place].

## 2019-07-06 LAB — PAP, TP IMAGING W/ HPV RNA, RFLX HPV TYPE 16,18/45: HPV DNA High Risk: NOT DETECTED

## 2019-11-17 DIAGNOSIS — Z3141 Encounter for fertility testing: Secondary | ICD-10-CM | POA: Diagnosis not present

## 2019-12-20 ENCOUNTER — Telehealth: Payer: Self-pay | Admitting: *Deleted

## 2019-12-20 DIAGNOSIS — Z13220 Encounter for screening for lipoid disorders: Secondary | ICD-10-CM

## 2019-12-20 DIAGNOSIS — Z01419 Encounter for gynecological examination (general) (routine) without abnormal findings: Secondary | ICD-10-CM

## 2019-12-20 NOTE — Telephone Encounter (Signed)
Patient had annual exam with Izora Gala on 07/05/19, did not have annual screening labs done. Called today asking if labs could be checked, lipid, glucose etc. Please advise

## 2019-12-20 NOTE — Telephone Encounter (Signed)
Patient informed, coming on 12/21/19 at 10:00am lab app made.

## 2019-12-20 NOTE — Telephone Encounter (Signed)
Sure! Let's do a CBC, CMP and lipid panel. Make sure she is fasting for this. Thank you!

## 2019-12-21 ENCOUNTER — Other Ambulatory Visit: Payer: No Typology Code available for payment source

## 2019-12-21 ENCOUNTER — Other Ambulatory Visit: Payer: Self-pay

## 2019-12-21 DIAGNOSIS — Z13220 Encounter for screening for lipoid disorders: Secondary | ICD-10-CM

## 2019-12-21 DIAGNOSIS — Z01419 Encounter for gynecological examination (general) (routine) without abnormal findings: Secondary | ICD-10-CM

## 2019-12-21 LAB — COMPREHENSIVE METABOLIC PANEL
AG Ratio: 1.3 (calc) (ref 1.0–2.5)
ALT: 19 U/L (ref 6–29)
AST: 14 U/L (ref 10–30)
Albumin: 4 g/dL (ref 3.6–5.1)
Alkaline phosphatase (APISO): 82 U/L (ref 31–125)
BUN: 14 mg/dL (ref 7–25)
CO2: 25 mmol/L (ref 20–32)
Calcium: 9.4 mg/dL (ref 8.6–10.2)
Chloride: 104 mmol/L (ref 98–110)
Creat: 0.79 mg/dL (ref 0.50–1.10)
Globulin: 3.1 g/dL (calc) (ref 1.9–3.7)
Glucose, Bld: 96 mg/dL (ref 65–99)
Potassium: 4.5 mmol/L (ref 3.5–5.3)
Sodium: 136 mmol/L (ref 135–146)
Total Bilirubin: 0.3 mg/dL (ref 0.2–1.2)
Total Protein: 7.1 g/dL (ref 6.1–8.1)

## 2019-12-21 LAB — CBC
HCT: 39.5 % (ref 35.0–45.0)
Hemoglobin: 12.5 g/dL (ref 11.7–15.5)
MCH: 25.8 pg — ABNORMAL LOW (ref 27.0–33.0)
MCHC: 31.6 g/dL — ABNORMAL LOW (ref 32.0–36.0)
MCV: 81.4 fL (ref 80.0–100.0)
MPV: 12.2 fL (ref 7.5–12.5)
Platelets: 288 10*3/uL (ref 140–400)
RBC: 4.85 10*6/uL (ref 3.80–5.10)
RDW: 14.3 % (ref 11.0–15.0)
WBC: 5.3 10*3/uL (ref 3.8–10.8)

## 2019-12-21 LAB — LIPID PANEL
Cholesterol: 210 mg/dL — ABNORMAL HIGH (ref <200)
HDL: 52 mg/dL (ref >=50)
LDL Cholesterol (Calc): 137 mg/dL (calc) — ABNORMAL HIGH
Non-HDL Cholesterol (Calc): 158 mg/dL (calc) — ABNORMAL HIGH (ref <130)
Total CHOL/HDL Ratio: 4 (calc) (ref <5.0)
Triglycerides: 105 mg/dL (ref <150)

## 2020-01-11 DIAGNOSIS — Z833 Family history of diabetes mellitus: Secondary | ICD-10-CM | POA: Diagnosis not present

## 2020-01-11 DIAGNOSIS — E785 Hyperlipidemia, unspecified: Secondary | ICD-10-CM | POA: Diagnosis not present

## 2020-01-11 DIAGNOSIS — Z8249 Family history of ischemic heart disease and other diseases of the circulatory system: Secondary | ICD-10-CM | POA: Diagnosis not present

## 2020-01-11 DIAGNOSIS — Z724 Inappropriate diet and eating habits: Secondary | ICD-10-CM | POA: Diagnosis not present

## 2020-01-25 DIAGNOSIS — E785 Hyperlipidemia, unspecified: Secondary | ICD-10-CM | POA: Diagnosis not present

## 2020-01-25 DIAGNOSIS — Z724 Inappropriate diet and eating habits: Secondary | ICD-10-CM | POA: Diagnosis not present

## 2020-07-08 ENCOUNTER — Encounter: Payer: 59 | Admitting: Nurse Practitioner

## 2020-07-08 ENCOUNTER — Ambulatory Visit: Payer: No Typology Code available for payment source | Admitting: Nurse Practitioner

## 2020-07-09 DIAGNOSIS — Z3141 Encounter for fertility testing: Secondary | ICD-10-CM | POA: Diagnosis not present

## 2020-07-09 DIAGNOSIS — N978 Female infertility of other origin: Secondary | ICD-10-CM | POA: Diagnosis not present

## 2020-07-17 DIAGNOSIS — N979 Female infertility, unspecified: Secondary | ICD-10-CM | POA: Diagnosis not present

## 2020-07-25 ENCOUNTER — Encounter: Payer: Self-pay | Admitting: Nurse Practitioner

## 2020-07-26 DIAGNOSIS — Z3141 Encounter for fertility testing: Secondary | ICD-10-CM | POA: Diagnosis not present

## 2020-07-27 ENCOUNTER — Encounter: Payer: Self-pay | Admitting: Nurse Practitioner

## 2020-07-29 ENCOUNTER — Encounter: Payer: Self-pay | Admitting: Nurse Practitioner

## 2020-07-29 ENCOUNTER — Ambulatory Visit (INDEPENDENT_AMBULATORY_CARE_PROVIDER_SITE_OTHER): Payer: No Typology Code available for payment source | Admitting: Nurse Practitioner

## 2020-07-29 ENCOUNTER — Other Ambulatory Visit: Payer: Self-pay

## 2020-07-29 VITALS — BP 128/80 | Ht 64.0 in | Wt 280.0 lb

## 2020-07-29 DIAGNOSIS — Z113 Encounter for screening for infections with a predominantly sexual mode of transmission: Secondary | ICD-10-CM

## 2020-07-29 DIAGNOSIS — Z01419 Encounter for gynecological examination (general) (routine) without abnormal findings: Secondary | ICD-10-CM | POA: Diagnosis not present

## 2020-07-29 DIAGNOSIS — Z319 Encounter for procreative management, unspecified: Secondary | ICD-10-CM

## 2020-07-29 NOTE — Telephone Encounter (Signed)
Patient has AEX appt scheduled today, 07/29/2020, with TW.

## 2020-07-29 NOTE — Patient Instructions (Signed)
Health Maintenance, Female Adopting a healthy lifestyle and getting preventive care are important in promoting health and wellness. Ask your health care provider about:  The right schedule for you to have regular tests and exams.  Things you can do on your own to prevent diseases and keep yourself healthy. What should I know about diet, weight, and exercise? Eat a healthy diet  Eat a diet that includes plenty of vegetables, fruits, low-fat dairy products, and lean protein.  Do not eat a lot of foods that are high in solid fats, added sugars, or sodium.   Maintain a healthy weight Body mass index (BMI) is used to identify weight problems. It estimates body fat based on height and weight. Your health care provider can help determine your BMI and help you achieve or maintain a healthy weight. Get regular exercise Get regular exercise. This is one of the most important things you can do for your health. Most adults should:  Exercise for at least 150 minutes each week. The exercise should increase your heart rate and make you sweat (moderate-intensity exercise).  Do strengthening exercises at least twice a week. This is in addition to the moderate-intensity exercise.  Spend less time sitting. Even light physical activity can be beneficial. Watch cholesterol and blood lipids Have your blood tested for lipids and cholesterol at 41 years of age, then have this test every 5 years. Have your cholesterol levels checked more often if:  Your lipid or cholesterol levels are high.  You are older than 40 years of age.  You are at high risk for heart disease. What should I know about cancer screening? Depending on your health history and family history, you may need to have cancer screening at various ages. This may include screening for:  Breast cancer.  Cervical cancer.  Colorectal cancer.  Skin cancer.  Lung cancer. What should I know about heart disease, diabetes, and high blood  pressure? Blood pressure and heart disease  High blood pressure causes heart disease and increases the risk of stroke. This is more likely to develop in people who have high blood pressure readings, are of African descent, or are overweight.  Have your blood pressure checked: ? Every 3-5 years if you are 18-39 years of age. ? Every year if you are 40 years old or older. Diabetes Have regular diabetes screenings. This checks your fasting blood sugar level. Have the screening done:  Once every three years after age 40 if you are at a normal weight and have a low risk for diabetes.  More often and at a younger age if you are overweight or have a high risk for diabetes. What should I know about preventing infection? Hepatitis B If you have a higher risk for hepatitis B, you should be screened for this virus. Talk with your health care provider to find out if you are at risk for hepatitis B infection. Hepatitis C Testing is recommended for:  Everyone born from 1945 through 1965.  Anyone with known risk factors for hepatitis C. Sexually transmitted infections (STIs)  Get screened for STIs, including gonorrhea and chlamydia, if: ? You are sexually active and are younger than 41 years of age. ? You are older than 41 years of age and your health care provider tells you that you are at risk for this type of infection. ? Your sexual activity has changed since you were last screened, and you are at increased risk for chlamydia or gonorrhea. Ask your health care provider   if you are at risk.  Ask your health care provider about whether you are at high risk for HIV. Your health care provider may recommend a prescription medicine to help prevent HIV infection. If you choose to take medicine to prevent HIV, you should first get tested for HIV. You should then be tested every 3 months for as long as you are taking the medicine. Pregnancy  If you are about to stop having your period (premenopausal) and  you may become pregnant, seek counseling before you get pregnant.  Take 400 to 800 micrograms (mcg) of folic acid every day if you become pregnant.  Ask for birth control (contraception) if you want to prevent pregnancy. Osteoporosis and menopause Osteoporosis is a disease in which the bones lose minerals and strength with aging. This can result in bone fractures. If you are 65 years old or older, or if you are at risk for osteoporosis and fractures, ask your health care provider if you should:  Be screened for bone loss.  Take a calcium or vitamin D supplement to lower your risk of fractures.  Be given hormone replacement therapy (HRT) to treat symptoms of menopause. Follow these instructions at home: Lifestyle  Do not use any products that contain nicotine or tobacco, such as cigarettes, e-cigarettes, and chewing tobacco. If you need help quitting, ask your health care provider.  Do not use street drugs.  Do not share needles.  Ask your health care provider for help if you need support or information about quitting drugs. Alcohol use  Do not drink alcohol if: ? Your health care provider tells you not to drink. ? You are pregnant, may be pregnant, or are planning to become pregnant.  If you drink alcohol: ? Limit how much you use to 0-1 drink a day. ? Limit intake if you are breastfeeding.  Be aware of how much alcohol is in your drink. In the [place] one drink equals one 12 oz bottle of beer (355 mL), one 5 oz glass of wine (148 mL), or one 1 oz glass of hard liquor (44 mL). General instructions  Schedule regular health, dental, and eye exams.  Stay current with your vaccines.  Tell your health care provider if: ? You often feel depressed. ? You have ever been abused or do not feel safe at home. Summary  Adopting a healthy lifestyle and getting preventive care are important in promoting health and wellness.  Follow your health care provider's instructions about healthy  diet, exercising, and getting tested or screened for diseases.  Follow your health care provider's instructions on monitoring your cholesterol and blood pressure. This information is not intended to replace advice given to you by your health care provider. Make sure you discuss any questions you have with your health care provider. Document Revised: 04/13/2018 Document Reviewed: 04/13/2018 Elsevier Patient Education  2021 [employer]vier Inc.  

## 2020-07-29 NOTE — Progress Notes (Signed)
   Joann [name] 1980/11/18 [id number]   History:  40 y.o. G0 presents for annual exam without GYN complaints. Monthly cycles. Normal pap history. History of primary infertility, seeing specialist - about to start IVF, has had myomectomies and polypectomies with most recent 08/2019 . She is wanting lab work today that was requested by fertility provider in [place]. She plans to see someone local and wants lab work done prior.   Gynecologic History Patient's last menstrual period was 07/12/2020. Period Pattern: (!) Irregular Menstrual Flow: Moderate Menstrual Control: Maxi pad Dysmenorrhea: (!) Moderate Dysmenorrhea Symptoms: Cramping Contraception/Family planning: none  Health Maintenance Last Pap: 07/2019. Results were: normal Last mammogram: Never Last colonoscopy: N/A Last Dexa:  N/A  Past medical history, past surgical history, family history and social history were all reviewed and documented in the EPIC chart. Works for [employer]. Married.  ROS:  A ROS was performed and pertinent positives and negatives are included.  Exam:  Vitals:   07/29/20 1357  BP: 128/80  Weight: 280 lb (127 kg)  Height: 5\' 4"  (1.626 m)   Body mass index is 48.06 kg/m.  General appearance:  Normal Thyroid:  Symmetrical, normal in size, without palpable masses or nodularity. Respiratory  Auscultation:  Clear without wheezing or rhonchi Cardiovascular  Auscultation:  Regular rate, without rubs, murmurs or gallops  Edema/varicosities:  Not grossly evident Abdominal  Soft,nontender, without masses, guarding or rebound.  Liver/spleen:  No organomegaly noted  Hernia:  None appreciated  Skin  Inspection:  Grossly normal   Breasts: Examined lying and sitting.   Right: Without masses, retractions, discharge or axillary adenopathy.   Left: Without masses, retractions, discharge or axillary adenopathy. Gentitourinary   Inguinal/mons:  Normal without inguinal adenopathy  External genitalia:   Normal  BUS/Urethra/Skene's glands:  Normal  Vagina:  Normal  Cervix:  Normal  Uterus:  Difficult to palpate due to body habitus but no gross masses or tenderness  Adnexa/parametria:     Rt: Without masses or tenderness.   Lt: Without masses or tenderness.  Anus and perineum: Normal  Assessment/Plan:  40 y.o. G0 for annual exam.   Well female exam with routine gynecological exam - Plan: CBC with Differential/Platelet, TSH. Education provided on SBEs, importance of preventative screenings, current guidelines, high calcium diet, regular exercise, and multivitamin daily.  Screening examination for venereal disease - Plan: HIV Antibody (routine testing w rflx), Hepatitis B Surface AntiGEN, Hepatitis C antibody, RPR, C. trachomatis/N. gonorrhoeae RNA. Requested by fertility specialist.   Infertility management - Plan: Prolactin, Rubella screen, Varicella zoster antibody, IgG, ABO AND RH . Requested by fertility specialist.  About to start IVF, has had myomectomies and polypectomies.   Screening for cervical cancer - Normal Pap history.  Will repeat at 5-year interval per guidelines.  Screening for breast cancer - has not has screening mammogram. Discussed current guidelines and importance of preventative screenings. Normal breast exam today.  Return in 1 year for annual.    Tamela Gammon DNP, 2:46 PM 07/29/2020

## 2020-07-30 LAB — CBC WITH DIFFERENTIAL/PLATELET
Absolute Monocytes: 531 cells/uL (ref 200–950)
Basophils Absolute: 59 cells/uL (ref 0–200)
Basophils Relative: 1 %
Eosinophils Absolute: 183 cells/uL (ref 15–500)
Eosinophils Relative: 3.1 %
HCT: 40.2 % (ref 35.0–45.0)
Hemoglobin: 12.8 g/dL (ref 11.7–15.5)
Lymphs Abs: 2095 cells/uL (ref 850–3900)
MCH: 26 pg — ABNORMAL LOW (ref 27.0–33.0)
MCHC: 31.8 g/dL — ABNORMAL LOW (ref 32.0–36.0)
MCV: 81.5 fL (ref 80.0–100.0)
MPV: 12 fL (ref 7.5–12.5)
Monocytes Relative: 9 %
Neutro Abs: 3033 cells/uL (ref 1500–7800)
Neutrophils Relative %: 51.4 %
Platelets: 283 10*3/uL (ref 140–400)
RBC: 4.93 10*6/uL (ref 3.80–5.10)
RDW: 13.4 % (ref 11.0–15.0)
Total Lymphocyte: 35.5 %
WBC: 5.9 10*3/uL (ref 3.8–10.8)

## 2020-07-30 LAB — HEPATITIS C ANTIBODY
Hepatitis C Ab: NONREACTIVE
SIGNAL TO CUT-OFF: 0.1 (ref <1.00)

## 2020-07-30 LAB — PROLACTIN: Prolactin: 15.3 ng/mL

## 2020-07-30 LAB — RUBELLA SCREEN: Rubella: 1.86 Index

## 2020-07-30 LAB — ABO AND RH

## 2020-07-30 LAB — RPR: RPR Ser Ql: NONREACTIVE

## 2020-07-30 LAB — HEPATITIS B SURFACE ANTIGEN: Hepatitis B Surface Ag: NONREACTIVE

## 2020-07-30 LAB — VARICELLA ZOSTER ANTIBODY, IGG: Varicella IgG: 681.2 index

## 2020-07-30 LAB — HIV ANTIBODY (ROUTINE TESTING W REFLEX): HIV 1&2 Ab, 4th Generation: NONREACTIVE

## 2020-07-30 LAB — TSH: TSH: 1.14 mIU/L

## 2020-07-31 LAB — C. TRACHOMATIS/N. GONORRHOEAE RNA
C. trachomatis RNA, TMA: NOT DETECTED
N. gonorrhoeae RNA, TMA: NOT DETECTED

## 2020-08-07 DIAGNOSIS — Z3141 Encounter for fertility testing: Secondary | ICD-10-CM | POA: Diagnosis not present

## 2020-08-14 DIAGNOSIS — N978 Female infertility of other origin: Secondary | ICD-10-CM | POA: Diagnosis not present

## 2020-08-14 DIAGNOSIS — Z3141 Encounter for fertility testing: Secondary | ICD-10-CM | POA: Diagnosis not present

## 2020-08-16 DIAGNOSIS — N979 Female infertility, unspecified: Secondary | ICD-10-CM | POA: Diagnosis not present

## 2020-08-19 DIAGNOSIS — Z3141 Encounter for fertility testing: Secondary | ICD-10-CM | POA: Diagnosis not present

## 2020-08-20 DIAGNOSIS — Z3141 Encounter for fertility testing: Secondary | ICD-10-CM | POA: Diagnosis not present

## 2020-08-26 DIAGNOSIS — N979 Female infertility, unspecified: Secondary | ICD-10-CM | POA: Diagnosis not present

## 2020-09-11 DIAGNOSIS — N979 Female infertility, unspecified: Secondary | ICD-10-CM | POA: Diagnosis not present

## 2020-09-18 DIAGNOSIS — Z3141 Encounter for fertility testing: Secondary | ICD-10-CM | POA: Diagnosis not present

## 2020-09-18 DIAGNOSIS — N978 Female infertility of other origin: Secondary | ICD-10-CM | POA: Diagnosis not present

## 2020-09-19 DIAGNOSIS — Z3141 Encounter for fertility testing: Secondary | ICD-10-CM | POA: Diagnosis not present

## 2020-09-28 DIAGNOSIS — Z20822 Contact with and (suspected) exposure to covid-19: Secondary | ICD-10-CM | POA: Diagnosis not present

## 2020-10-04 ENCOUNTER — Encounter: Payer: Self-pay | Admitting: Nurse Practitioner

## 2020-10-04 DIAGNOSIS — Z3169 Encounter for other general counseling and advice on procreation: Secondary | ICD-10-CM | POA: Diagnosis not present

## 2020-10-10 DIAGNOSIS — N979 Female infertility, unspecified: Secondary | ICD-10-CM | POA: Diagnosis not present

## 2020-10-14 DIAGNOSIS — N979 Female infertility, unspecified: Secondary | ICD-10-CM | POA: Diagnosis not present

## 2020-10-15 DIAGNOSIS — N979 Female infertility, unspecified: Secondary | ICD-10-CM | POA: Diagnosis not present

## 2020-10-16 DIAGNOSIS — N978 Female infertility of other origin: Secondary | ICD-10-CM | POA: Diagnosis not present

## 2020-10-31 DIAGNOSIS — R350 Frequency of micturition: Secondary | ICD-10-CM | POA: Diagnosis not present

## 2020-10-31 DIAGNOSIS — R300 Dysuria: Secondary | ICD-10-CM | POA: Diagnosis not present

## 2020-11-01 DIAGNOSIS — N979 Female infertility, unspecified: Secondary | ICD-10-CM | POA: Diagnosis not present

## 2020-12-05 DIAGNOSIS — N979 Female infertility, unspecified: Secondary | ICD-10-CM | POA: Diagnosis not present

## 2020-12-05 DIAGNOSIS — N978 Female infertility of other origin: Secondary | ICD-10-CM | POA: Diagnosis not present

## 2021-01-10 DIAGNOSIS — Z20822 Contact with and (suspected) exposure to covid-19: Secondary | ICD-10-CM | POA: Diagnosis not present

## 2021-01-22 DIAGNOSIS — N978 Female infertility of other origin: Secondary | ICD-10-CM | POA: Diagnosis not present

## 2021-01-22 DIAGNOSIS — N979 Female infertility, unspecified: Secondary | ICD-10-CM | POA: Diagnosis not present

## 2021-01-29 DIAGNOSIS — Z3141 Encounter for fertility testing: Secondary | ICD-10-CM | POA: Diagnosis not present

## 2021-02-06 DIAGNOSIS — Z3141 Encounter for fertility testing: Secondary | ICD-10-CM | POA: Diagnosis not present

## 2021-02-10 DIAGNOSIS — Z3141 Encounter for fertility testing: Secondary | ICD-10-CM | POA: Diagnosis not present

## 2021-02-11 DIAGNOSIS — Z3141 Encounter for fertility testing: Secondary | ICD-10-CM | POA: Diagnosis not present

## 2021-02-12 DIAGNOSIS — Z3141 Encounter for fertility testing: Secondary | ICD-10-CM | POA: Diagnosis not present

## 2021-02-13 DIAGNOSIS — N979 Female infertility, unspecified: Secondary | ICD-10-CM | POA: Diagnosis not present

## 2021-02-14 DIAGNOSIS — N978 Female infertility of other origin: Secondary | ICD-10-CM | POA: Diagnosis not present

## 2021-02-28 DIAGNOSIS — N979 Female infertility, unspecified: Secondary | ICD-10-CM | POA: Diagnosis not present

## 2021-02-28 DIAGNOSIS — N978 Female infertility of other origin: Secondary | ICD-10-CM | POA: Diagnosis not present

## 2021-03-03 DIAGNOSIS — N979 Female infertility, unspecified: Secondary | ICD-10-CM | POA: Diagnosis not present

## 2021-03-10 DIAGNOSIS — Z3201 Encounter for pregnancy test, result positive: Secondary | ICD-10-CM | POA: Diagnosis not present

## 2021-03-19 DIAGNOSIS — Z3141 Encounter for fertility testing: Secondary | ICD-10-CM | POA: Diagnosis not present

## 2021-03-26 DIAGNOSIS — Z3200 Encounter for pregnancy test, result unknown: Secondary | ICD-10-CM | POA: Diagnosis not present

## 2021-03-26 DIAGNOSIS — Z6841 Body Mass Index (BMI) 40.0 and over, adult: Secondary | ICD-10-CM | POA: Diagnosis not present

## 2021-04-02 DIAGNOSIS — Z3141 Encounter for fertility testing: Secondary | ICD-10-CM | POA: Diagnosis not present

## 2021-04-08 DIAGNOSIS — Z3200 Encounter for pregnancy test, result unknown: Secondary | ICD-10-CM | POA: Diagnosis not present

## 2021-04-14 DIAGNOSIS — O09511 Supervision of elderly primigravida, first trimester: Secondary | ICD-10-CM | POA: Diagnosis not present

## 2021-04-14 DIAGNOSIS — O09811 Supervision of pregnancy resulting from assisted reproductive technology, first trimester: Secondary | ICD-10-CM | POA: Diagnosis not present

## 2021-04-21 ENCOUNTER — Ambulatory Visit (HOSPITAL_COMMUNITY): Payer: 59

## 2021-04-21 DIAGNOSIS — O0991 Supervision of high risk pregnancy, unspecified, first trimester: Secondary | ICD-10-CM | POA: Diagnosis not present

## 2021-04-21 DIAGNOSIS — O09811 Supervision of pregnancy resulting from assisted reproductive technology, first trimester: Secondary | ICD-10-CM | POA: Diagnosis not present

## 2021-04-21 DIAGNOSIS — N611 Abscess of the breast and nipple: Secondary | ICD-10-CM | POA: Diagnosis not present

## 2021-04-21 DIAGNOSIS — O10011 Pre-existing essential hypertension complicating pregnancy, first trimester: Secondary | ICD-10-CM | POA: Diagnosis not present

## 2021-04-21 DIAGNOSIS — O3429 Maternal care due to uterine scar from other previous surgery: Secondary | ICD-10-CM | POA: Diagnosis not present

## 2021-04-21 DIAGNOSIS — O09511 Supervision of elderly primigravida, first trimester: Secondary | ICD-10-CM | POA: Diagnosis not present

## 2021-04-21 DIAGNOSIS — O3411 Maternal care for benign tumor of corpus uteri, first trimester: Secondary | ICD-10-CM | POA: Diagnosis not present

## 2021-04-21 DIAGNOSIS — O99211 Obesity complicating pregnancy, first trimester: Secondary | ICD-10-CM | POA: Diagnosis not present

## 2021-04-21 LAB — OB RESULTS CONSOLE ABO/RH: RH Type: POSITIVE

## 2021-04-21 LAB — OB RESULTS CONSOLE RPR: RPR: NONREACTIVE

## 2021-04-21 LAB — OB RESULTS CONSOLE RUBELLA ANTIBODY, IGM: Rubella: IMMUNE

## 2021-04-21 LAB — OB RESULTS CONSOLE HIV ANTIBODY (ROUTINE TESTING): HIV: NONREACTIVE

## 2021-04-21 LAB — OB RESULTS CONSOLE HEPATITIS B SURFACE ANTIGEN: Hepatitis B Surface Ag: NEGATIVE

## 2021-04-21 LAB — HEPATITIS C ANTIBODY: HCV Ab: NEGATIVE

## 2021-04-21 LAB — OB RESULTS CONSOLE ANTIBODY SCREEN: Antibody Screen: NEGATIVE

## 2021-04-22 ENCOUNTER — Ambulatory Visit (HOSPITAL_COMMUNITY): Payer: 59

## 2021-04-22 ENCOUNTER — Other Ambulatory Visit: Payer: Self-pay | Admitting: Obstetrics and Gynecology

## 2021-04-22 DIAGNOSIS — R922 Inconclusive mammogram: Secondary | ICD-10-CM | POA: Diagnosis not present

## 2021-04-22 DIAGNOSIS — N644 Mastodynia: Secondary | ICD-10-CM | POA: Diagnosis not present

## 2021-04-22 DIAGNOSIS — Z3689 Encounter for other specified antenatal screening: Secondary | ICD-10-CM

## 2021-05-03 ENCOUNTER — Ambulatory Visit (HOSPITAL_COMMUNITY): Payer: 59

## 2021-05-05 ENCOUNTER — Ambulatory Visit (HOSPITAL_COMMUNITY): Payer: 59

## 2021-05-21 DIAGNOSIS — O09812 Supervision of pregnancy resulting from assisted reproductive technology, second trimester: Secondary | ICD-10-CM | POA: Diagnosis not present

## 2021-05-21 DIAGNOSIS — Z361 Encounter for antenatal screening for raised alphafetoprotein level: Secondary | ICD-10-CM | POA: Diagnosis not present

## 2021-05-21 DIAGNOSIS — O3429 Maternal care due to uterine scar from other previous surgery: Secondary | ICD-10-CM | POA: Diagnosis not present

## 2021-05-21 DIAGNOSIS — O10012 Pre-existing essential hypertension complicating pregnancy, second trimester: Secondary | ICD-10-CM | POA: Diagnosis not present

## 2021-05-21 DIAGNOSIS — O3412 Maternal care for benign tumor of corpus uteri, second trimester: Secondary | ICD-10-CM | POA: Diagnosis not present

## 2021-05-21 DIAGNOSIS — Z0379 Encounter for other suspected maternal and fetal conditions ruled out: Secondary | ICD-10-CM | POA: Diagnosis not present

## 2021-05-21 DIAGNOSIS — O99212 Obesity complicating pregnancy, second trimester: Secondary | ICD-10-CM | POA: Diagnosis not present

## 2021-05-21 DIAGNOSIS — O09512 Supervision of elderly primigravida, second trimester: Secondary | ICD-10-CM | POA: Diagnosis not present

## 2021-06-10 ENCOUNTER — Encounter: Payer: Self-pay | Admitting: *Deleted

## 2021-06-17 ENCOUNTER — Ambulatory Visit: Payer: No Typology Code available for payment source | Admitting: *Deleted

## 2021-06-17 ENCOUNTER — Ambulatory Visit (HOSPITAL_BASED_OUTPATIENT_CLINIC_OR_DEPARTMENT_OTHER): Payer: No Typology Code available for payment source | Admitting: Obstetrics

## 2021-06-17 ENCOUNTER — Ambulatory Visit: Payer: No Typology Code available for payment source | Attending: Obstetrics and Gynecology

## 2021-06-17 ENCOUNTER — Encounter: Payer: Self-pay | Admitting: *Deleted

## 2021-06-17 ENCOUNTER — Other Ambulatory Visit: Payer: Self-pay | Admitting: *Deleted

## 2021-06-17 ENCOUNTER — Other Ambulatory Visit: Payer: Self-pay

## 2021-06-17 VITALS — BP 129/77 | HR 103

## 2021-06-17 DIAGNOSIS — Z3689 Encounter for other specified antenatal screening: Secondary | ICD-10-CM | POA: Insufficient documentation

## 2021-06-17 DIAGNOSIS — Z3A19 19 weeks gestation of pregnancy: Secondary | ICD-10-CM | POA: Insufficient documentation

## 2021-06-17 DIAGNOSIS — O09812 Supervision of pregnancy resulting from assisted reproductive technology, second trimester: Secondary | ICD-10-CM | POA: Insufficient documentation

## 2021-06-17 DIAGNOSIS — O09512 Supervision of elderly primigravida, second trimester: Secondary | ICD-10-CM | POA: Diagnosis not present

## 2021-06-17 DIAGNOSIS — O99212 Obesity complicating pregnancy, second trimester: Secondary | ICD-10-CM | POA: Diagnosis not present

## 2021-06-17 DIAGNOSIS — O3429 Maternal care due to uterine scar from other previous surgery: Secondary | ICD-10-CM

## 2021-06-17 DIAGNOSIS — Z3183 Encounter for assisted reproductive fertility procedure cycle: Secondary | ICD-10-CM

## 2021-06-17 DIAGNOSIS — O09522 Supervision of elderly multigravida, second trimester: Secondary | ICD-10-CM | POA: Diagnosis not present

## 2021-06-17 DIAGNOSIS — O10912 Unspecified pre-existing hypertension complicating pregnancy, second trimester: Secondary | ICD-10-CM

## 2021-06-17 NOTE — Progress Notes (Signed)
MFM Note  Joann [name] was seen for a detailed fetal anatomy scan due to maternal obesity with a BMI of 16 and advanced maternal age (41 years old).  This is an IVF pregnancy.  She had IVF performed in [place].  The patient reports that she has had two prior myomectomies.  She reports that she had a negative screen for gestational diabetes earlier in her pregnancy.  She denies any other significant past medical history and denies any problems in her current pregnancy.    She had a cell free DNA test earlier in her pregnancy which indicated a low risk for trisomy 63, 48, and 13. A female fetus is predicted.   She was informed that the fetal growth and amniotic fluid level were appropriate for her gestational age.   The views of the fetal anatomy were limited today due to the fetal position and maternal body habitus.  The patient was informed that anomalies may be missed due to technical limitations. If the fetus is in a suboptimal position or maternal habitus is increased, visualization of the fetus in the maternal uterus may be impaired.  The following were discussed during our consultation today:  Obesity in pregnancy  The recommended total weight gain in pregnancy for obese women's between 10 to 20 pounds.  Due to obesity, she should be rescreened for gestational diabetes again at between 72 to 25 weeks.  As maternal obesity may present challenges associated with the management of anesthesia, an anesthesia consult should be obtained when she is admitted in labor.  Due to advanced maternal age and maternal obesity, weekly fetal testing should be started at around 35 weeks.  Advanced maternal age in pregnancy  The increased risk of fetal aneuploidy due to advanced maternal age was discussed.   Due to advanced maternal age, the patient was offered and declined an amniocentesis today for definitive diagnosis of fetal aneuploidy.  She is comfortable with her negative cell free  DNA test.  IVF pregnancy  The small risk of congenital heart defects associated with IVF pregnancies was discussed.    Due to the IVF pregnancy, she was given a referral to [facility] pediatric cardiology for a fetal echocardiogram.  History of prior myomectomies  Due to her prior myomectomies, she should have a cesarean delivery scheduled at around 37 weeks.  A follow-up exam was scheduled in 4 weeks to assess the fetal growth and to complete the views of the fetal anatomy.  The patient stated that all of her questions have been answered to her satisfaction.    We will continue to follow her closely with you throughout her pregnancy.  A total of 30 minutes was spent counseling and coordinating the care for this patient.  Greater than 50% of the time was spent in direct face-to-face contact.

## 2021-06-19 DIAGNOSIS — O3412 Maternal care for benign tumor of corpus uteri, second trimester: Secondary | ICD-10-CM | POA: Diagnosis not present

## 2021-06-19 DIAGNOSIS — O10012 Pre-existing essential hypertension complicating pregnancy, second trimester: Secondary | ICD-10-CM | POA: Diagnosis not present

## 2021-06-19 DIAGNOSIS — O09512 Supervision of elderly primigravida, second trimester: Secondary | ICD-10-CM | POA: Diagnosis not present

## 2021-06-19 DIAGNOSIS — O99212 Obesity complicating pregnancy, second trimester: Secondary | ICD-10-CM | POA: Diagnosis not present

## 2021-06-19 DIAGNOSIS — O09812 Supervision of pregnancy resulting from assisted reproductive technology, second trimester: Secondary | ICD-10-CM | POA: Diagnosis not present

## 2021-06-19 DIAGNOSIS — O09519 Supervision of elderly primigravida, unspecified trimester: Secondary | ICD-10-CM | POA: Diagnosis not present

## 2021-06-19 DIAGNOSIS — O3429 Maternal care due to uterine scar from other previous surgery: Secondary | ICD-10-CM | POA: Diagnosis not present

## 2021-07-15 ENCOUNTER — Other Ambulatory Visit: Payer: Self-pay

## 2021-07-15 ENCOUNTER — Ambulatory Visit: Payer: No Typology Code available for payment source | Admitting: *Deleted

## 2021-07-15 ENCOUNTER — Ambulatory Visit: Payer: No Typology Code available for payment source | Attending: Obstetrics

## 2021-07-15 VITALS — BP 139/79 | HR 98

## 2021-07-15 DIAGNOSIS — Z3183 Encounter for assisted reproductive fertility procedure cycle: Secondary | ICD-10-CM | POA: Insufficient documentation

## 2021-07-15 DIAGNOSIS — O3412 Maternal care for benign tumor of corpus uteri, second trimester: Secondary | ICD-10-CM | POA: Diagnosis not present

## 2021-07-15 DIAGNOSIS — O09512 Supervision of elderly primigravida, second trimester: Secondary | ICD-10-CM

## 2021-07-15 DIAGNOSIS — D259 Leiomyoma of uterus, unspecified: Secondary | ICD-10-CM

## 2021-07-15 DIAGNOSIS — O09522 Supervision of elderly multigravida, second trimester: Secondary | ICD-10-CM | POA: Diagnosis not present

## 2021-07-15 DIAGNOSIS — O09812 Supervision of pregnancy resulting from assisted reproductive technology, second trimester: Secondary | ICD-10-CM

## 2021-07-15 DIAGNOSIS — O99212 Obesity complicating pregnancy, second trimester: Secondary | ICD-10-CM | POA: Diagnosis not present

## 2021-07-15 DIAGNOSIS — O10012 Pre-existing essential hypertension complicating pregnancy, second trimester: Secondary | ICD-10-CM | POA: Diagnosis not present

## 2021-07-15 DIAGNOSIS — O10912 Unspecified pre-existing hypertension complicating pregnancy, second trimester: Secondary | ICD-10-CM | POA: Diagnosis not present

## 2021-07-15 DIAGNOSIS — Z3A23 23 weeks gestation of pregnancy: Secondary | ICD-10-CM | POA: Diagnosis not present

## 2021-07-16 ENCOUNTER — Other Ambulatory Visit: Payer: Self-pay | Admitting: *Deleted

## 2021-07-16 DIAGNOSIS — Z6841 Body Mass Index (BMI) 40.0 and over, adult: Secondary | ICD-10-CM

## 2021-07-16 DIAGNOSIS — O09522 Supervision of elderly multigravida, second trimester: Secondary | ICD-10-CM

## 2021-07-16 DIAGNOSIS — O10912 Unspecified pre-existing hypertension complicating pregnancy, second trimester: Secondary | ICD-10-CM

## 2021-07-27 DIAGNOSIS — R051 Acute cough: Secondary | ICD-10-CM | POA: Diagnosis not present

## 2021-07-29 ENCOUNTER — Telehealth: Payer: Self-pay | Admitting: *Deleted

## 2021-07-29 NOTE — Telephone Encounter (Signed)
Reviewed with Tiffany. Patient currently [redacted] weeks pregnant. Per Tiffany, does not need a pap this year. Up to patient if she would like to be seen or not. Call to patient. Patient states she would like to cancel appointment. Appointment cancelled per patient request.  ? ?Encounter closed.  ?

## 2021-07-30 ENCOUNTER — Ambulatory Visit: Payer: No Typology Code available for payment source | Admitting: Nurse Practitioner

## 2021-08-06 DIAGNOSIS — O10013 Pre-existing essential hypertension complicating pregnancy, third trimester: Secondary | ICD-10-CM | POA: Diagnosis not present

## 2021-08-06 DIAGNOSIS — Z3689 Encounter for other specified antenatal screening: Secondary | ICD-10-CM | POA: Diagnosis not present

## 2021-08-06 DIAGNOSIS — O3429 Maternal care due to uterine scar from other previous surgery: Secondary | ICD-10-CM | POA: Diagnosis not present

## 2021-08-06 DIAGNOSIS — O09813 Supervision of pregnancy resulting from assisted reproductive technology, third trimester: Secondary | ICD-10-CM | POA: Diagnosis not present

## 2021-08-06 DIAGNOSIS — O3413 Maternal care for benign tumor of corpus uteri, third trimester: Secondary | ICD-10-CM | POA: Diagnosis not present

## 2021-08-06 DIAGNOSIS — O09513 Supervision of elderly primigravida, third trimester: Secondary | ICD-10-CM | POA: Diagnosis not present

## 2021-08-06 DIAGNOSIS — O99213 Obesity complicating pregnancy, third trimester: Secondary | ICD-10-CM | POA: Diagnosis not present

## 2021-08-06 DIAGNOSIS — O0992 Supervision of high risk pregnancy, unspecified, second trimester: Secondary | ICD-10-CM | POA: Diagnosis not present

## 2021-08-11 ENCOUNTER — Other Ambulatory Visit: Payer: Self-pay

## 2021-08-11 ENCOUNTER — Inpatient Hospital Stay (HOSPITAL_COMMUNITY)
Admission: AD | Admit: 2021-08-11 | Discharge: 2021-08-11 | Disposition: A | Discharge status: Home / Self Care | Payer: No Typology Code available for payment source | Attending: Obstetrics and Gynecology | Admitting: Obstetrics and Gynecology

## 2021-08-11 ENCOUNTER — Encounter (HOSPITAL_COMMUNITY): Payer: Self-pay | Admitting: Obstetrics and Gynecology

## 2021-08-11 DIAGNOSIS — R1030 Lower abdominal pain, unspecified: Secondary | ICD-10-CM | POA: Insufficient documentation

## 2021-08-11 DIAGNOSIS — O26892 Other specified pregnancy related conditions, second trimester: Secondary | ICD-10-CM | POA: Diagnosis not present

## 2021-08-11 DIAGNOSIS — Z3A27 27 weeks gestation of pregnancy: Secondary | ICD-10-CM | POA: Diagnosis not present

## 2021-08-11 DIAGNOSIS — O2342 Unspecified infection of urinary tract in pregnancy, second trimester: Secondary | ICD-10-CM

## 2021-08-11 LAB — URINALYSIS, ROUTINE W REFLEX MICROSCOPIC
Bilirubin Urine: NEGATIVE
Glucose, UA: NEGATIVE mg/dL
Hgb urine dipstick: NEGATIVE
Ketones, ur: NEGATIVE mg/dL
Nitrite: NEGATIVE
Protein, ur: 30 mg/dL — AB
Specific Gravity, Urine: 1.026 (ref 1.005–1.030)
pH: 5 (ref 5.0–8.0)

## 2021-08-11 MED ORDER — NITROFURANTOIN MONOHYD MACRO 100 MG PO CAPS: 100.0000 mg | ORAL_CAPSULE | Freq: Two times a day (BID) | ORAL | 0 refills | Status: AC

## 2021-08-11 NOTE — MAU Note (Signed)
...  Joann [name] is a 41 y.o. at 70w5dhere in MAU reporting: Constant bilateral lower back and lower abdominal pain since around midnight last night that radiates down into her thighs at times. No VB or LOF. +FM. She states she "feels flutters" and "has not started feeling strong kicks yet." ? ?Pain score:  ?5/10 lower back ?4/10 lower abdomen ? ?FHT: 140 initial ? ? ?

## 2021-08-11 NOTE — MAU Provider Note (Signed)
History  ?  ? ?Chief Complaint  ?Patient presents with  ? Back Pain  ? Abdominal Pain  ? ?41 yo G1P0 BF morbidly obese with hx myomectomy @ 28 1/[redacted] wk gestation presents to MAU with c/o low back pain and abdominal pain since midnight. Pain radiated down thighs. Pain was  sharp not associated with n/v/f. (+) urinary frequency denies dysuria  ? ?OB History   ? ? Gravida  ?1  ? Para  ?0  ? Term  ?0  ? Preterm  ?0  ? AB  ?0  ? Living  ?0  ?  ? ? SAB  ?0  ? IAB  ?0  ? Ectopic  ?0  ? Multiple  ?0  ? Live Births  ?0  ?   ?  ?  ? ? ?Past Medical History:  ?Diagnosis Date  ? Anxiety   ? Fibroids   ? ? ?Past Surgical History:  ?Procedure Laterality Date  ? HYSTEROSCOPY    ? MYOMECTOMY    ? ? ?Family History  ?Problem Relation Age of Onset  ? Hypertension Mother   ? Hypertension Father   ? ? ?Social History  ? ?Tobacco Use  ? Smoking status: Never  ? Smokeless tobacco: Never  ?Vaping Use  ? Vaping Use: Never used  ?Substance Use Topics  ? Alcohol use: Not Currently  ?  Comment: SOCIALLY  ? Drug use: No  ? ? ?Allergies: No Known Allergies ? ?Medications Prior to Admission  ?Medication Sig Dispense Refill Last Dose  ? albuterol (ACCUNEB) 0.63 MG/3ML nebulizer solution Take 1 ampule by nebulization every 6 (six) hours as needed for wheezing.   08/11/2021  ? cholecalciferol (VITAMIN D3) 25 MCG (1000 UNIT) tablet Take 1,000 Units by mouth daily.   08/11/2021  ? ferrous sulfate 325 (65 FE) MG tablet Take 325 mg by mouth 2 (two) times daily with a meal.   08/11/2021  ? folic acid (FOLVITE) 831 MCG tablet Take 400 mcg by mouth daily.   08/11/2021  ? magnesium oxide (MAG-OX) 400 MG tablet Take 400 mg by mouth daily.   08/11/2021  ? Prenatal Vit-Fe Fumarate-FA (MULTIVITAMIN-PRENATAL) 27-0.8 MG TABS tablet Take 1 tablet by mouth daily at 12 noon.   08/11/2021  ? vitamin E 180 MG (400 UNITS) capsule Take 400 Units by mouth daily.   08/11/2021  ? ? ? ?Physical Exam  ? ?Blood pressure 126/76, pulse (!) 104, temperature 97.9 ?F (36.6 ?C),  temperature source Oral, resp. rate 17, height '5\' 5"'$  (1.651 m), weight (!) 138.3 kg, last menstrual period 02/04/2021, SpO2 100 %. ? ?General appearance: alert, cooperative, no distress, and morbidly obese ?Back: no tenderness to percussion or palpation ?Lungs: clear to auscultation bilaterally ?Heart: regular rate and rhythm, S1, S2 normal, no murmur, click, rub or gallop ?Abdomen:  gravid soft  nontender ?Pelvic: external genitalia normal and closed/long/oop ?Extremities: edema +1 ? Tracing: baseline 130 (+) accel to 150-155  good variability ?No ctxi ? ?IMP; UTI in pregnancy ?Lower abdominal pain affecting pregnancy ?Morbid obesity ?IUP @ 28 1/7 wk ?Chronic HTn affecting pregnancy ?P) ucx sent. Empiric treatment of UTI pending ucx. Increase po fluid d/c home ? ?MDM ? ? ?Marvene Staff, MD 6:23 PM 08/11/2021 ? ? ? ?  ?

## 2021-08-13 ENCOUNTER — Ambulatory Visit: Payer: No Typology Code available for payment source | Attending: Obstetrics and Gynecology

## 2021-08-13 ENCOUNTER — Ambulatory Visit: Payer: No Typology Code available for payment source | Admitting: *Deleted

## 2021-08-13 VITALS — BP 126/58 | HR 63

## 2021-08-13 DIAGNOSIS — D259 Leiomyoma of uterus, unspecified: Secondary | ICD-10-CM | POA: Insufficient documentation

## 2021-08-13 DIAGNOSIS — Z6841 Body Mass Index (BMI) 40.0 and over, adult: Secondary | ICD-10-CM | POA: Insufficient documentation

## 2021-08-13 DIAGNOSIS — O3412 Maternal care for benign tumor of corpus uteri, second trimester: Secondary | ICD-10-CM | POA: Diagnosis not present

## 2021-08-13 DIAGNOSIS — Z3A28 28 weeks gestation of pregnancy: Secondary | ICD-10-CM | POA: Diagnosis not present

## 2021-08-13 DIAGNOSIS — O99213 Obesity complicating pregnancy, third trimester: Secondary | ICD-10-CM

## 2021-08-13 DIAGNOSIS — O09512 Supervision of elderly primigravida, second trimester: Secondary | ICD-10-CM | POA: Insufficient documentation

## 2021-08-13 DIAGNOSIS — O09819 Supervision of pregnancy resulting from assisted reproductive technology, unspecified trimester: Secondary | ICD-10-CM | POA: Insufficient documentation

## 2021-08-13 DIAGNOSIS — O10013 Pre-existing essential hypertension complicating pregnancy, third trimester: Secondary | ICD-10-CM

## 2021-08-13 DIAGNOSIS — O09813 Supervision of pregnancy resulting from assisted reproductive technology, third trimester: Secondary | ICD-10-CM

## 2021-08-13 DIAGNOSIS — O09523 Supervision of elderly multigravida, third trimester: Secondary | ICD-10-CM | POA: Diagnosis not present

## 2021-08-13 DIAGNOSIS — O09522 Supervision of elderly multigravida, second trimester: Secondary | ICD-10-CM | POA: Diagnosis not present

## 2021-08-13 DIAGNOSIS — O3413 Maternal care for benign tumor of corpus uteri, third trimester: Secondary | ICD-10-CM | POA: Diagnosis not present

## 2021-08-13 DIAGNOSIS — O10912 Unspecified pre-existing hypertension complicating pregnancy, second trimester: Secondary | ICD-10-CM | POA: Insufficient documentation

## 2021-08-14 ENCOUNTER — Other Ambulatory Visit: Payer: Self-pay | Admitting: *Deleted

## 2021-08-14 DIAGNOSIS — O09513 Supervision of elderly primigravida, third trimester: Secondary | ICD-10-CM

## 2021-08-14 DIAGNOSIS — Z6841 Body Mass Index (BMI) 40.0 and over, adult: Secondary | ICD-10-CM

## 2021-08-14 DIAGNOSIS — O10913 Unspecified pre-existing hypertension complicating pregnancy, third trimester: Secondary | ICD-10-CM

## 2021-08-14 NOTE — Progress Notes (Unsigned)
U

## 2021-09-04 DIAGNOSIS — O09519 Supervision of elderly primigravida, unspecified trimester: Secondary | ICD-10-CM | POA: Diagnosis not present

## 2021-09-04 DIAGNOSIS — L0291 Cutaneous abscess, unspecified: Secondary | ICD-10-CM | POA: Diagnosis not present

## 2021-09-04 DIAGNOSIS — O09523 Supervision of elderly multigravida, third trimester: Secondary | ICD-10-CM | POA: Diagnosis not present

## 2021-09-04 DIAGNOSIS — O3429 Maternal care due to uterine scar from other previous surgery: Secondary | ICD-10-CM | POA: Diagnosis not present

## 2021-09-04 DIAGNOSIS — O09813 Supervision of pregnancy resulting from assisted reproductive technology, third trimester: Secondary | ICD-10-CM | POA: Diagnosis not present

## 2021-09-04 DIAGNOSIS — O99213 Obesity complicating pregnancy, third trimester: Secondary | ICD-10-CM | POA: Diagnosis not present

## 2021-09-04 DIAGNOSIS — O10013 Pre-existing essential hypertension complicating pregnancy, third trimester: Secondary | ICD-10-CM | POA: Diagnosis not present

## 2021-09-04 DIAGNOSIS — O09513 Supervision of elderly primigravida, third trimester: Secondary | ICD-10-CM | POA: Diagnosis not present

## 2021-09-04 DIAGNOSIS — Z23 Encounter for immunization: Secondary | ICD-10-CM | POA: Diagnosis not present

## 2021-09-04 DIAGNOSIS — Z6841 Body Mass Index (BMI) 40.0 and over, adult: Secondary | ICD-10-CM | POA: Diagnosis not present

## 2021-09-05 DIAGNOSIS — Z3689 Encounter for other specified antenatal screening: Secondary | ICD-10-CM | POA: Diagnosis not present

## 2021-09-05 DIAGNOSIS — O09513 Supervision of elderly primigravida, third trimester: Secondary | ICD-10-CM | POA: Diagnosis not present

## 2021-09-11 ENCOUNTER — Encounter: Payer: Self-pay | Admitting: *Deleted

## 2021-09-11 ENCOUNTER — Ambulatory Visit: Payer: No Typology Code available for payment source | Attending: Maternal & Fetal Medicine

## 2021-09-11 ENCOUNTER — Ambulatory Visit: Payer: No Typology Code available for payment source | Admitting: *Deleted

## 2021-09-11 VITALS — BP 119/70 | HR 114

## 2021-09-11 DIAGNOSIS — O10013 Pre-existing essential hypertension complicating pregnancy, third trimester: Secondary | ICD-10-CM | POA: Diagnosis not present

## 2021-09-11 DIAGNOSIS — O09819 Supervision of pregnancy resulting from assisted reproductive technology, unspecified trimester: Secondary | ICD-10-CM | POA: Insufficient documentation

## 2021-09-11 DIAGNOSIS — O10019 Pre-existing essential hypertension complicating pregnancy, unspecified trimester: Secondary | ICD-10-CM | POA: Insufficient documentation

## 2021-09-11 DIAGNOSIS — Z362 Encounter for other antenatal screening follow-up: Secondary | ICD-10-CM | POA: Diagnosis not present

## 2021-09-11 DIAGNOSIS — O09513 Supervision of elderly primigravida, third trimester: Secondary | ICD-10-CM

## 2021-09-11 DIAGNOSIS — O99213 Obesity complicating pregnancy, third trimester: Secondary | ICD-10-CM | POA: Diagnosis not present

## 2021-09-11 DIAGNOSIS — O10913 Unspecified pre-existing hypertension complicating pregnancy, third trimester: Secondary | ICD-10-CM | POA: Insufficient documentation

## 2021-09-11 DIAGNOSIS — O3412 Maternal care for benign tumor of corpus uteri, second trimester: Secondary | ICD-10-CM

## 2021-09-11 DIAGNOSIS — D259 Leiomyoma of uterus, unspecified: Secondary | ICD-10-CM

## 2021-09-11 DIAGNOSIS — O3413 Maternal care for benign tumor of corpus uteri, third trimester: Secondary | ICD-10-CM | POA: Diagnosis not present

## 2021-09-11 DIAGNOSIS — Z3A32 32 weeks gestation of pregnancy: Secondary | ICD-10-CM | POA: Insufficient documentation

## 2021-09-11 DIAGNOSIS — E669 Obesity, unspecified: Secondary | ICD-10-CM

## 2021-09-11 DIAGNOSIS — O09813 Supervision of pregnancy resulting from assisted reproductive technology, third trimester: Secondary | ICD-10-CM

## 2021-09-11 DIAGNOSIS — Z6841 Body Mass Index (BMI) 40.0 and over, adult: Secondary | ICD-10-CM | POA: Insufficient documentation

## 2021-09-11 DIAGNOSIS — O09523 Supervision of elderly multigravida, third trimester: Secondary | ICD-10-CM | POA: Insufficient documentation

## 2021-09-12 ENCOUNTER — Other Ambulatory Visit: Payer: Self-pay | Admitting: *Deleted

## 2021-09-12 DIAGNOSIS — O10913 Unspecified pre-existing hypertension complicating pregnancy, third trimester: Secondary | ICD-10-CM

## 2021-09-12 DIAGNOSIS — O09513 Supervision of elderly primigravida, third trimester: Secondary | ICD-10-CM

## 2021-09-17 ENCOUNTER — Ambulatory Visit: Payer: No Typology Code available for payment source | Admitting: *Deleted

## 2021-09-17 ENCOUNTER — Ambulatory Visit: Payer: No Typology Code available for payment source | Attending: Maternal & Fetal Medicine

## 2021-09-17 VITALS — BP 129/52 | HR 58

## 2021-09-17 DIAGNOSIS — O10013 Pre-existing essential hypertension complicating pregnancy, third trimester: Secondary | ICD-10-CM | POA: Diagnosis not present

## 2021-09-17 DIAGNOSIS — Z6841 Body Mass Index (BMI) 40.0 and over, adult: Secondary | ICD-10-CM | POA: Insufficient documentation

## 2021-09-17 DIAGNOSIS — O99213 Obesity complicating pregnancy, third trimester: Secondary | ICD-10-CM

## 2021-09-17 DIAGNOSIS — O09513 Supervision of elderly primigravida, third trimester: Secondary | ICD-10-CM | POA: Diagnosis not present

## 2021-09-17 DIAGNOSIS — D259 Leiomyoma of uterus, unspecified: Secondary | ICD-10-CM

## 2021-09-17 DIAGNOSIS — O09813 Supervision of pregnancy resulting from assisted reproductive technology, third trimester: Secondary | ICD-10-CM | POA: Diagnosis not present

## 2021-09-17 DIAGNOSIS — Z3A33 33 weeks gestation of pregnancy: Secondary | ICD-10-CM | POA: Diagnosis not present

## 2021-09-17 DIAGNOSIS — O10913 Unspecified pre-existing hypertension complicating pregnancy, third trimester: Secondary | ICD-10-CM | POA: Insufficient documentation

## 2021-09-17 DIAGNOSIS — O09523 Supervision of elderly multigravida, third trimester: Secondary | ICD-10-CM

## 2021-09-17 DIAGNOSIS — E669 Obesity, unspecified: Secondary | ICD-10-CM

## 2021-09-17 DIAGNOSIS — O3412 Maternal care for benign tumor of corpus uteri, second trimester: Secondary | ICD-10-CM | POA: Diagnosis not present

## 2021-09-18 DIAGNOSIS — O99213 Obesity complicating pregnancy, third trimester: Secondary | ICD-10-CM | POA: Diagnosis not present

## 2021-09-18 DIAGNOSIS — O09513 Supervision of elderly primigravida, third trimester: Secondary | ICD-10-CM | POA: Diagnosis not present

## 2021-09-18 DIAGNOSIS — O09519 Supervision of elderly primigravida, unspecified trimester: Secondary | ICD-10-CM | POA: Diagnosis not present

## 2021-09-18 DIAGNOSIS — O10013 Pre-existing essential hypertension complicating pregnancy, third trimester: Secondary | ICD-10-CM | POA: Diagnosis not present

## 2021-09-18 DIAGNOSIS — O09813 Supervision of pregnancy resulting from assisted reproductive technology, third trimester: Secondary | ICD-10-CM | POA: Diagnosis not present

## 2021-09-18 DIAGNOSIS — O3429 Maternal care due to uterine scar from other previous surgery: Secondary | ICD-10-CM | POA: Diagnosis not present

## 2021-09-24 ENCOUNTER — Ambulatory Visit: Payer: No Typology Code available for payment source | Attending: Maternal & Fetal Medicine

## 2021-09-24 ENCOUNTER — Ambulatory Visit: Payer: No Typology Code available for payment source | Admitting: *Deleted

## 2021-09-24 VITALS — BP 121/58 | HR 104

## 2021-09-24 DIAGNOSIS — O3412 Maternal care for benign tumor of corpus uteri, second trimester: Secondary | ICD-10-CM | POA: Diagnosis not present

## 2021-09-24 DIAGNOSIS — O09513 Supervision of elderly primigravida, third trimester: Secondary | ICD-10-CM | POA: Diagnosis not present

## 2021-09-24 DIAGNOSIS — O99213 Obesity complicating pregnancy, third trimester: Secondary | ICD-10-CM

## 2021-09-24 DIAGNOSIS — E669 Obesity, unspecified: Secondary | ICD-10-CM

## 2021-09-24 DIAGNOSIS — D259 Leiomyoma of uterus, unspecified: Secondary | ICD-10-CM | POA: Diagnosis not present

## 2021-09-24 DIAGNOSIS — O10013 Pre-existing essential hypertension complicating pregnancy, third trimester: Secondary | ICD-10-CM

## 2021-09-24 DIAGNOSIS — Z6841 Body Mass Index (BMI) 40.0 and over, adult: Secondary | ICD-10-CM | POA: Diagnosis not present

## 2021-09-24 DIAGNOSIS — Z3A34 34 weeks gestation of pregnancy: Secondary | ICD-10-CM

## 2021-09-24 DIAGNOSIS — O10913 Unspecified pre-existing hypertension complicating pregnancy, third trimester: Secondary | ICD-10-CM | POA: Insufficient documentation

## 2021-09-24 DIAGNOSIS — O09813 Supervision of pregnancy resulting from assisted reproductive technology, third trimester: Secondary | ICD-10-CM | POA: Diagnosis not present

## 2021-10-01 ENCOUNTER — Ambulatory Visit: Payer: No Typology Code available for payment source | Attending: Maternal & Fetal Medicine

## 2021-10-01 ENCOUNTER — Ambulatory Visit: Payer: No Typology Code available for payment source | Admitting: *Deleted

## 2021-10-01 VITALS — BP 126/66 | HR 93

## 2021-10-01 DIAGNOSIS — Z6841 Body Mass Index (BMI) 40.0 and over, adult: Secondary | ICD-10-CM

## 2021-10-01 DIAGNOSIS — O10013 Pre-existing essential hypertension complicating pregnancy, third trimester: Secondary | ICD-10-CM

## 2021-10-01 DIAGNOSIS — O3412 Maternal care for benign tumor of corpus uteri, second trimester: Secondary | ICD-10-CM

## 2021-10-01 DIAGNOSIS — D259 Leiomyoma of uterus, unspecified: Secondary | ICD-10-CM

## 2021-10-01 DIAGNOSIS — O09513 Supervision of elderly primigravida, third trimester: Secondary | ICD-10-CM

## 2021-10-01 DIAGNOSIS — O99213 Obesity complicating pregnancy, third trimester: Secondary | ICD-10-CM

## 2021-10-01 DIAGNOSIS — E669 Obesity, unspecified: Secondary | ICD-10-CM

## 2021-10-01 DIAGNOSIS — O09523 Supervision of elderly multigravida, third trimester: Secondary | ICD-10-CM | POA: Diagnosis not present

## 2021-10-01 DIAGNOSIS — Z3A35 35 weeks gestation of pregnancy: Secondary | ICD-10-CM | POA: Insufficient documentation

## 2021-10-01 DIAGNOSIS — O09819 Supervision of pregnancy resulting from assisted reproductive technology, unspecified trimester: Secondary | ICD-10-CM

## 2021-10-01 DIAGNOSIS — O10913 Unspecified pre-existing hypertension complicating pregnancy, third trimester: Secondary | ICD-10-CM | POA: Diagnosis not present

## 2021-10-08 ENCOUNTER — Ambulatory Visit: Payer: No Typology Code available for payment source | Admitting: *Deleted

## 2021-10-08 ENCOUNTER — Ambulatory Visit: Payer: No Typology Code available for payment source | Attending: Maternal & Fetal Medicine

## 2021-10-08 VITALS — BP 116/66 | HR 101

## 2021-10-08 DIAGNOSIS — E669 Obesity, unspecified: Secondary | ICD-10-CM | POA: Diagnosis not present

## 2021-10-08 DIAGNOSIS — O3412 Maternal care for benign tumor of corpus uteri, second trimester: Secondary | ICD-10-CM | POA: Diagnosis not present

## 2021-10-08 DIAGNOSIS — O09513 Supervision of elderly primigravida, third trimester: Secondary | ICD-10-CM | POA: Diagnosis not present

## 2021-10-08 DIAGNOSIS — O10913 Unspecified pre-existing hypertension complicating pregnancy, third trimester: Secondary | ICD-10-CM | POA: Diagnosis not present

## 2021-10-08 DIAGNOSIS — O09813 Supervision of pregnancy resulting from assisted reproductive technology, third trimester: Secondary | ICD-10-CM

## 2021-10-08 DIAGNOSIS — Z6841 Body Mass Index (BMI) 40.0 and over, adult: Secondary | ICD-10-CM | POA: Diagnosis not present

## 2021-10-08 DIAGNOSIS — D259 Leiomyoma of uterus, unspecified: Secondary | ICD-10-CM

## 2021-10-08 DIAGNOSIS — O09523 Supervision of elderly multigravida, third trimester: Secondary | ICD-10-CM

## 2021-10-08 DIAGNOSIS — O99213 Obesity complicating pregnancy, third trimester: Secondary | ICD-10-CM

## 2021-10-08 DIAGNOSIS — Z3A36 36 weeks gestation of pregnancy: Secondary | ICD-10-CM | POA: Diagnosis not present

## 2021-10-08 DIAGNOSIS — O10013 Pre-existing essential hypertension complicating pregnancy, third trimester: Secondary | ICD-10-CM | POA: Diagnosis not present

## 2021-10-09 ENCOUNTER — Encounter (HOSPITAL_COMMUNITY): Payer: Self-pay

## 2021-10-09 DIAGNOSIS — Z3685 Encounter for antenatal screening for Streptococcus B: Secondary | ICD-10-CM | POA: Diagnosis not present

## 2021-10-09 DIAGNOSIS — O99213 Obesity complicating pregnancy, third trimester: Secondary | ICD-10-CM | POA: Diagnosis not present

## 2021-10-09 DIAGNOSIS — O09813 Supervision of pregnancy resulting from assisted reproductive technology, third trimester: Secondary | ICD-10-CM | POA: Diagnosis not present

## 2021-10-09 DIAGNOSIS — O09519 Supervision of elderly primigravida, unspecified trimester: Secondary | ICD-10-CM | POA: Diagnosis not present

## 2021-10-09 DIAGNOSIS — O09513 Supervision of elderly primigravida, third trimester: Secondary | ICD-10-CM | POA: Diagnosis not present

## 2021-10-09 DIAGNOSIS — O3663X0 Maternal care for excessive fetal growth, third trimester, not applicable or unspecified: Secondary | ICD-10-CM | POA: Diagnosis not present

## 2021-10-09 DIAGNOSIS — O3429 Maternal care due to uterine scar from other previous surgery: Secondary | ICD-10-CM | POA: Diagnosis not present

## 2021-10-09 DIAGNOSIS — O10013 Pre-existing essential hypertension complicating pregnancy, third trimester: Secondary | ICD-10-CM | POA: Diagnosis not present

## 2021-10-09 DIAGNOSIS — O3413 Maternal care for benign tumor of corpus uteri, third trimester: Secondary | ICD-10-CM | POA: Diagnosis not present

## 2021-10-09 NOTE — Patient Instructions (Signed)
Joann [name]  10/09/2021   Your procedure is scheduled on:  10/15/2021  Arrive at 3:00 PM at Entrance C on [address] at [employer]  and [address]. You are invited to use the FREE valet parking or use the Visitor's parking deck.  Pick up the phone at the desk and dial [phone].  Call this number if you have problems the morning of surgery: [phone]  Remember:   Do not eat food:(6 Hours before arrival) 6 horas ante llegada.toast or other bread and clear liquid only 9:00 AM  Do not drink clear liquids: 2 hours before arrival 1:00 PM  Take these medicines the morning of surgery with A SIP OF WATER:  none   Do not wear jewelry, make-up or nail polish.  Do not wear lotions, powders, or perfumes. Do not wear deodorant.  Do not shave 48 hours prior to surgery.  Do not bring valuables to the hospital.  [facility] is not   responsible for any belongings or valuables brought to the hospital.  Contacts, dentures or bridgework may not be worn into surgery.  Leave suitcase in the car. After surgery it may be brought to your room.  For patients admitted to the hospital, checkout time is 11:00 AM the day of              discharge.      Please read over the following fact sheets that you were given:     Preparing for Surgery

## 2021-10-10 DIAGNOSIS — O3429 Maternal care due to uterine scar from other previous surgery: Secondary | ICD-10-CM | POA: Diagnosis not present

## 2021-10-10 DIAGNOSIS — O09813 Supervision of pregnancy resulting from assisted reproductive technology, third trimester: Secondary | ICD-10-CM

## 2021-10-10 DIAGNOSIS — O10913 Unspecified pre-existing hypertension complicating pregnancy, third trimester: Secondary | ICD-10-CM | POA: Diagnosis present

## 2021-10-10 DIAGNOSIS — O09519 Supervision of elderly primigravida, unspecified trimester: Secondary | ICD-10-CM | POA: Diagnosis not present

## 2021-10-10 DIAGNOSIS — O09513 Supervision of elderly primigravida, third trimester: Secondary | ICD-10-CM | POA: Diagnosis not present

## 2021-10-10 DIAGNOSIS — J45909 Unspecified asthma, uncomplicated: Secondary | ICD-10-CM | POA: Diagnosis present

## 2021-10-10 DIAGNOSIS — O99213 Obesity complicating pregnancy, third trimester: Secondary | ICD-10-CM | POA: Diagnosis present

## 2021-10-10 HISTORY — DX: Unspecified pre-existing hypertension complicating pregnancy, third trimester: O10.913

## 2021-10-10 HISTORY — DX: Maternal care due to uterine scar from other previous surgery: O34.29

## 2021-10-10 HISTORY — DX: Supervision of pregnancy resulting from assisted reproductive technology, third trimester: O09.813

## 2021-10-13 ENCOUNTER — Encounter (HOSPITAL_COMMUNITY)
Admission: RE | Admit: 2021-10-13 | Discharge: 2021-10-13 | Disposition: A | Discharge status: Home / Self Care | Payer: No Typology Code available for payment source | Source: Ambulatory Visit | Attending: Family Medicine | Admitting: Family Medicine

## 2021-10-13 DIAGNOSIS — Z01812 Encounter for preprocedural laboratory examination: Secondary | ICD-10-CM | POA: Insufficient documentation

## 2021-10-13 DIAGNOSIS — Z01818 Encounter for other preprocedural examination: Secondary | ICD-10-CM

## 2021-10-13 HISTORY — DX: Gestational (pregnancy-induced) hypertension without significant proteinuria, unspecified trimester: O13.9

## 2021-10-13 LAB — CBC
HCT: 34.9 % — ABNORMAL LOW (ref 36.0–46.0)
Hemoglobin: 11.2 g/dL — ABNORMAL LOW (ref 12.0–15.0)
MCH: 26.9 pg (ref 26.0–34.0)
MCHC: 32.1 g/dL (ref 30.0–36.0)
MCV: 83.9 fL (ref 80.0–100.0)
Platelets: 233 10*3/uL (ref 150–400)
RBC: 4.16 MIL/uL (ref 3.87–5.11)
RDW: 15.3 % (ref 11.5–15.5)
WBC: 8 10*3/uL (ref 4.0–10.5)
nRBC: 0 % (ref 0.0–0.2)

## 2021-10-13 LAB — TYPE AND SCREEN
ABO/RH(D): B POS
Antibody Screen: NEGATIVE

## 2021-10-13 LAB — RPR: RPR Ser Ql: NONREACTIVE

## 2021-10-15 ENCOUNTER — Inpatient Hospital Stay (HOSPITAL_COMMUNITY): Payer: No Typology Code available for payment source | Admitting: Anesthesiology

## 2021-10-15 ENCOUNTER — Encounter (HOSPITAL_COMMUNITY): Payer: Self-pay | Admitting: Obstetrics and Gynecology

## 2021-10-15 ENCOUNTER — Encounter (HOSPITAL_COMMUNITY)
Admission: RE | Disposition: A | Discharge status: Home / Self Care | Payer: Self-pay | Source: Home / Self Care | Attending: Obstetrics and Gynecology

## 2021-10-15 ENCOUNTER — Other Ambulatory Visit: Payer: Self-pay

## 2021-10-15 ENCOUNTER — Inpatient Hospital Stay (HOSPITAL_COMMUNITY)
Admission: RE | Admit: 2021-10-15 | Discharge: 2021-10-18 | DRG: 787 | Disposition: A | Discharge status: Home / Self Care | Payer: No Typology Code available for payment source | Attending: Obstetrics and Gynecology | Admitting: Obstetrics and Gynecology

## 2021-10-15 DIAGNOSIS — O3413 Maternal care for benign tumor of corpus uteri, third trimester: Secondary | ICD-10-CM | POA: Diagnosis present

## 2021-10-15 DIAGNOSIS — O09813 Supervision of pregnancy resulting from assisted reproductive technology, third trimester: Secondary | ICD-10-CM

## 2021-10-15 DIAGNOSIS — O09519 Supervision of elderly primigravida, unspecified trimester: Secondary | ICD-10-CM | POA: Diagnosis not present

## 2021-10-15 DIAGNOSIS — Z3A00 Weeks of gestation of pregnancy not specified: Secondary | ICD-10-CM | POA: Diagnosis not present

## 2021-10-15 DIAGNOSIS — O3663X0 Maternal care for excessive fetal growth, third trimester, not applicable or unspecified: Secondary | ICD-10-CM | POA: Diagnosis not present

## 2021-10-15 DIAGNOSIS — O99213 Obesity complicating pregnancy, third trimester: Secondary | ICD-10-CM | POA: Diagnosis present

## 2021-10-15 DIAGNOSIS — O9952 Diseases of the respiratory system complicating childbirth: Secondary | ICD-10-CM | POA: Diagnosis present

## 2021-10-15 DIAGNOSIS — O1002 Pre-existing essential hypertension complicating childbirth: Secondary | ICD-10-CM | POA: Diagnosis present

## 2021-10-15 DIAGNOSIS — O322XX0 Maternal care for transverse and oblique lie, not applicable or unspecified: Secondary | ICD-10-CM | POA: Diagnosis not present

## 2021-10-15 DIAGNOSIS — O99214 Obesity complicating childbirth: Secondary | ICD-10-CM | POA: Diagnosis not present

## 2021-10-15 DIAGNOSIS — D250 Submucous leiomyoma of uterus: Secondary | ICD-10-CM | POA: Diagnosis present

## 2021-10-15 DIAGNOSIS — O09513 Supervision of elderly primigravida, third trimester: Secondary | ICD-10-CM | POA: Diagnosis not present

## 2021-10-15 DIAGNOSIS — O10013 Pre-existing essential hypertension complicating pregnancy, third trimester: Secondary | ICD-10-CM | POA: Diagnosis not present

## 2021-10-15 DIAGNOSIS — O1092 Unspecified pre-existing hypertension complicating childbirth: Secondary | ICD-10-CM

## 2021-10-15 DIAGNOSIS — J45909 Unspecified asthma, uncomplicated: Secondary | ICD-10-CM | POA: Diagnosis present

## 2021-10-15 DIAGNOSIS — Z3A37 37 weeks gestation of pregnancy: Secondary | ICD-10-CM

## 2021-10-15 DIAGNOSIS — D259 Leiomyoma of uterus, unspecified: Secondary | ICD-10-CM

## 2021-10-15 DIAGNOSIS — O3429 Maternal care due to uterine scar from other previous surgery: Secondary | ICD-10-CM

## 2021-10-15 DIAGNOSIS — O10913 Unspecified pre-existing hypertension complicating pregnancy, third trimester: Secondary | ICD-10-CM | POA: Diagnosis present

## 2021-10-15 DIAGNOSIS — Z01818 Encounter for other preprocedural examination: Principal | ICD-10-CM

## 2021-10-15 LAB — ABO/RH: ABO/RH(D): B POS

## 2021-10-15 SURGERY — Surgical Case
Anesthesia: Spinal

## 2021-10-15 MED ORDER — FENTANYL CITRATE (PF) 100 MCG/2ML IJ SOLN
INTRAMUSCULAR | Status: DC | PRN
  Administered 2021-10-15: 15 ug via INTRATHECAL

## 2021-10-15 MED ORDER — DEXAMETHASONE SODIUM PHOSPHATE 4 MG/ML IJ SOLN
INTRAMUSCULAR | Status: AC
  Filled 2021-10-15: qty 2

## 2021-10-15 MED ORDER — PHENYLEPHRINE HCL (PRESSORS) 10 MG/ML IV SOLN
INTRAVENOUS | Status: DC | PRN
  Administered 2021-10-15 (×2): 80 ug via INTRAVENOUS
  Administered 2021-10-15: 160 ug via INTRAVENOUS
  Administered 2021-10-15: 80 ug via INTRAVENOUS

## 2021-10-15 MED ORDER — FENTANYL CITRATE (PF) 100 MCG/2ML IJ SOLN
INTRAMUSCULAR | Status: AC
  Filled 2021-10-15: qty 2

## 2021-10-15 MED ORDER — ONDANSETRON HCL 4 MG/2ML IJ SOLN
INTRAMUSCULAR | Status: AC
  Filled 2021-10-15: qty 2

## 2021-10-15 MED ORDER — MORPHINE SULFATE (PF) 0.5 MG/ML IJ SOLN
INTRAMUSCULAR | Status: AC
  Filled 2021-10-15: qty 10

## 2021-10-15 MED ORDER — DEXMEDETOMIDINE HCL IN NACL 80 MCG/20ML IV SOLN
INTRAVENOUS | Status: AC
  Filled 2021-10-15: qty 20

## 2021-10-15 MED ORDER — SOD CITRATE-CITRIC ACID 500-334 MG/5ML PO SOLN
ORAL | Status: AC
  Filled 2021-10-15: qty 30

## 2021-10-15 MED ORDER — KETOROLAC TROMETHAMINE 30 MG/ML IJ SOLN
INTRAMUSCULAR | Status: AC
  Filled 2021-10-15: qty 1

## 2021-10-15 MED ORDER — SODIUM CHLORIDE 0.9% FLUSH: 3.0000 mL | INTRAVENOUS | Status: DC | PRN

## 2021-10-15 MED ORDER — MEPERIDINE HCL 25 MG/ML IJ SOLN: 6.2500 mg | INTRAMUSCULAR | Status: DC | PRN

## 2021-10-15 MED ORDER — PHENYLEPHRINE HCL-NACL 20-0.9 MG/250ML-% IV SOLN
INTRAVENOUS | Status: DC | PRN
  Administered 2021-10-15: 60 ug/min via INTRAVENOUS

## 2021-10-15 MED ORDER — KETOROLAC TROMETHAMINE 30 MG/ML IJ SOLN
30.0000 mg | Freq: Four times a day (QID) | INTRAMUSCULAR | Status: AC | PRN
  Administered 2021-10-15: 30 mg via INTRAVENOUS

## 2021-10-15 MED ORDER — LACTATED RINGERS IV SOLN: INTRAVENOUS | Status: DC

## 2021-10-15 MED ORDER — SENNOSIDES-DOCUSATE SODIUM 8.6-50 MG PO TABS
2.0000 | ORAL_TABLET | ORAL | Status: DC
Start: 2021-10-16 — End: 2021-10-18
  Administered 2021-10-16 – 2021-10-18 (×3): 2 via ORAL
  Filled 2021-10-15 (×3): qty 2

## 2021-10-15 MED ORDER — CHLORHEXIDINE GLUCONATE 0.12 % MT SOLN
OROMUCOSAL | Status: AC
  Filled 2021-10-15: qty 15

## 2021-10-15 MED ORDER — SOD CITRATE-CITRIC ACID 500-334 MG/5ML PO SOLN
30.0000 mL | Freq: Once | ORAL | Status: AC
  Administered 2021-10-15: 30 mL via ORAL

## 2021-10-15 MED ORDER — NALOXONE HCL 0.4 MG/ML IJ SOLN: 0.4000 mg | INTRAMUSCULAR | Status: DC | PRN

## 2021-10-15 MED ORDER — OXYCODONE HCL 5 MG PO TABS: 5.0000 mg | ORAL_TABLET | ORAL | Status: DC | PRN

## 2021-10-15 MED ORDER — OXYCODONE HCL 5 MG/5ML PO SOLN: 5.0000 mg | Freq: Once | ORAL | Status: DC | PRN

## 2021-10-15 MED ORDER — BUPIVACAINE HCL (PF) 0.25 % IJ SOLN
INTRAMUSCULAR | Status: AC
  Filled 2021-10-15: qty 10

## 2021-10-15 MED ORDER — CEFAZOLIN IN SODIUM CHLORIDE 3-0.9 GM/100ML-% IV SOLN
3.0000 g | INTRAVENOUS | Status: AC
  Administered 2021-10-15: 3 g via INTRAVENOUS

## 2021-10-15 MED ORDER — FENTANYL CITRATE (PF) 100 MCG/2ML IJ SOLN: 25.0000 ug | INTRAMUSCULAR | Status: DC | PRN

## 2021-10-15 MED ORDER — SCOPOLAMINE 1 MG/3DAYS TD PT72
1.0000 | MEDICATED_PATCH | Freq: Once | TRANSDERMAL | Status: DC
  Administered 2021-10-15: 1.5 mg via TRANSDERMAL

## 2021-10-15 MED ORDER — NALOXONE HCL 4 MG/10ML IJ SOLN: 1.0000 ug/kg/h | INTRAVENOUS | Status: DC | PRN

## 2021-10-15 MED ORDER — SOD CITRATE-CITRIC ACID 500-334 MG/5ML PO SOLN: 30.0000 mL | ORAL | Status: DC

## 2021-10-15 MED ORDER — METOCLOPRAMIDE HCL 5 MG/ML IJ SOLN
INTRAMUSCULAR | Status: DC | PRN
  Administered 2021-10-15: 8 mg via INTRAVENOUS

## 2021-10-15 MED ORDER — OXYTOCIN-SODIUM CHLORIDE 30-0.9 UT/500ML-% IV SOLN
INTRAVENOUS | Status: AC
  Filled 2021-10-15: qty 500

## 2021-10-15 MED ORDER — METOCLOPRAMIDE HCL 5 MG/ML IJ SOLN: 10.0000 mg | Freq: Once | INTRAMUSCULAR | Status: DC | PRN

## 2021-10-15 MED ORDER — BUPIVACAINE HCL (PF) 0.25 % IJ SOLN
INTRAMUSCULAR | Status: DC | PRN
  Administered 2021-10-15: 10 mL

## 2021-10-15 MED ORDER — POVIDONE-IODINE 10 % EX SWAB
2.0000 "application " | Freq: Once | CUTANEOUS | Status: AC
  Administered 2021-10-15: 2 via TOPICAL

## 2021-10-15 MED ORDER — SIMETHICONE 80 MG PO CHEW
80.0000 mg | CHEWABLE_TABLET | Freq: Three times a day (TID) | ORAL | Status: DC
  Administered 2021-10-16 – 2021-10-18 (×7): 80 mg via ORAL
  Filled 2021-10-15 (×7): qty 1

## 2021-10-15 MED ORDER — ONDANSETRON HCL 4 MG/2ML IJ SOLN: 4.0000 mg | Freq: Three times a day (TID) | INTRAMUSCULAR | Status: DC | PRN

## 2021-10-15 MED ORDER — DIPHENHYDRAMINE HCL 25 MG PO CAPS: 25.0000 mg | ORAL_CAPSULE | Freq: Four times a day (QID) | ORAL | Status: DC | PRN

## 2021-10-15 MED ORDER — DIPHENHYDRAMINE HCL 50 MG/ML IJ SOLN: 12.5000 mg | INTRAMUSCULAR | Status: DC | PRN

## 2021-10-15 MED ORDER — WITCH HAZEL-GLYCERIN EX PADS
1.0000 | MEDICATED_PAD | CUTANEOUS | Status: DC | PRN
Start: 2021-10-15 — End: 2021-10-18

## 2021-10-15 MED ORDER — OXYTOCIN-SODIUM CHLORIDE 30-0.9 UT/500ML-% IV SOLN
INTRAVENOUS | Status: DC | PRN
  Administered 2021-10-15: 300 mL via INTRAVENOUS

## 2021-10-15 MED ORDER — ACETAMINOPHEN 10 MG/ML IV SOLN
INTRAVENOUS | Status: DC | PRN
  Administered 2021-10-15: 1000 mg via INTRAVENOUS

## 2021-10-15 MED ORDER — DIBUCAINE (PERIANAL) 1 % EX OINT: 1.0000 "application " | TOPICAL_OINTMENT | CUTANEOUS | Status: DC | PRN

## 2021-10-15 MED ORDER — PRENATAL MULTIVITAMIN CH
1.0000 | ORAL_TABLET | Freq: Every day | ORAL | Status: DC
  Administered 2021-10-16 – 2021-10-18 (×3): 1 via ORAL
  Filled 2021-10-15 (×3): qty 1

## 2021-10-15 MED ORDER — ORAL CARE MOUTH RINSE: 15.0000 mL | Freq: Once | OROMUCOSAL | Status: AC

## 2021-10-15 MED ORDER — SIMETHICONE 80 MG PO CHEW: 80.0000 mg | CHEWABLE_TABLET | ORAL | Status: DC | PRN

## 2021-10-15 MED ORDER — ZOLPIDEM TARTRATE 5 MG PO TABS: 5.0000 mg | ORAL_TABLET | Freq: Every evening | ORAL | Status: DC | PRN

## 2021-10-15 MED ORDER — PHENYLEPHRINE 80 MCG/ML (10ML) SYRINGE FOR IV PUSH (FOR BLOOD PRESSURE SUPPORT)
PREFILLED_SYRINGE | INTRAVENOUS | Status: AC
  Filled 2021-10-15: qty 10

## 2021-10-15 MED ORDER — ONDANSETRON HCL 4 MG/2ML IJ SOLN
INTRAMUSCULAR | Status: DC | PRN
  Administered 2021-10-15: 4 mg via INTRAVENOUS

## 2021-10-15 MED ORDER — CEFAZOLIN IN SODIUM CHLORIDE 3-0.9 GM/100ML-% IV SOLN
INTRAVENOUS | Status: AC
  Filled 2021-10-15: qty 100

## 2021-10-15 MED ORDER — MENTHOL 3 MG MT LOZG: 1.0000 | LOZENGE | OROMUCOSAL | Status: DC | PRN

## 2021-10-15 MED ORDER — SCOPOLAMINE 1 MG/3DAYS TD PT72
1.0000 | MEDICATED_PATCH | Freq: Once | TRANSDERMAL | Status: DC
  Filled 2021-10-15: qty 1

## 2021-10-15 MED ORDER — OXYCODONE HCL 5 MG PO TABS: 5.0000 mg | ORAL_TABLET | Freq: Once | ORAL | Status: DC | PRN

## 2021-10-15 MED ORDER — KETOROLAC TROMETHAMINE 30 MG/ML IJ SOLN: 30.0000 mg | Freq: Four times a day (QID) | INTRAMUSCULAR | Status: AC | PRN

## 2021-10-15 MED ORDER — SCOPOLAMINE 1 MG/3DAYS TD PT72
MEDICATED_PATCH | TRANSDERMAL | Status: AC
  Filled 2021-10-15: qty 1

## 2021-10-15 MED ORDER — PHENYLEPHRINE HCL-NACL 20-0.9 MG/250ML-% IV SOLN
INTRAVENOUS | Status: AC
  Filled 2021-10-15: qty 250

## 2021-10-15 MED ORDER — CHLORHEXIDINE GLUCONATE 0.12 % MT SOLN
15.0000 mL | Freq: Once | OROMUCOSAL | Status: AC
Start: 2021-10-15 — End: 2021-10-15
  Administered 2021-10-15: 15 mL via OROMUCOSAL

## 2021-10-15 MED ORDER — MORPHINE SULFATE (PF) 0.5 MG/ML IJ SOLN
INTRAMUSCULAR | Status: DC | PRN
  Administered 2021-10-15: 150 ug via INTRATHECAL

## 2021-10-15 MED ORDER — COCONUT OIL OIL
1.0000 "application " | TOPICAL_OIL | Status: DC | PRN
  Administered 2021-10-18: 1 via TOPICAL

## 2021-10-15 MED ORDER — IBUPROFEN 600 MG PO TABS
600.0000 mg | ORAL_TABLET | Freq: Four times a day (QID) | ORAL | Status: DC
  Administered 2021-10-15 – 2021-10-18 (×11): 600 mg via ORAL
  Filled 2021-10-15 (×11): qty 1

## 2021-10-15 MED ORDER — FAMOTIDINE 20 MG PO TABS
ORAL_TABLET | ORAL | Status: AC
  Filled 2021-10-15: qty 1

## 2021-10-15 MED ORDER — OXYTOCIN-SODIUM CHLORIDE 30-0.9 UT/500ML-% IV SOLN
2.5000 [IU]/h | INTRAVENOUS | Status: AC
  Administered 2021-10-15: 2.5 [IU]/h via INTRAVENOUS
  Filled 2021-10-15: qty 500

## 2021-10-15 MED ORDER — FAMOTIDINE 20 MG PO TABS
20.0000 mg | ORAL_TABLET | Freq: Once | ORAL | Status: AC
  Administered 2021-10-15: 20 mg via ORAL

## 2021-10-15 MED ORDER — METOCLOPRAMIDE HCL 5 MG/ML IJ SOLN
INTRAMUSCULAR | Status: AC
  Filled 2021-10-15: qty 2

## 2021-10-15 MED ORDER — DIPHENHYDRAMINE HCL 25 MG PO CAPS: 25.0000 mg | ORAL_CAPSULE | ORAL | Status: DC | PRN

## 2021-10-15 MED ORDER — BUPIVACAINE IN DEXTROSE 0.75-8.25 % IT SOLN
INTRATHECAL | Status: DC | PRN
  Administered 2021-10-15: 1.6 mL via INTRATHECAL

## 2021-10-15 SURGICAL SUPPLY — 47 items
APL SKNCLS STERI-STRIP NONHPOA (GAUZE/BANDAGES/DRESSINGS)
BARRIER ADHS 3X4 INTERCEED (GAUZE/BANDAGES/DRESSINGS) ×3 IMPLANT
BENZOIN TINCTURE PRP APPL 2/3 (GAUZE/BANDAGES/DRESSINGS) IMPLANT
BRR ADH 4X3 ABS CNTRL BYND (GAUZE/BANDAGES/DRESSINGS) ×1
CANISTER PREVENA PLUS 150 (CANNISTER) ×1 IMPLANT
CHLORAPREP W/TINT 26ML (MISCELLANEOUS) ×3 IMPLANT
CLAMP CORD UMBIL (MISCELLANEOUS) IMPLANT
CLOTH BEACON ORANGE TIMEOUT ST (SAFETY) ×3 IMPLANT
DRAPE C SECTION CLR SCREEN (DRAPES) ×3 IMPLANT
DRESSING PEEL AND PLAC PRVNA20 (GAUZE/BANDAGES/DRESSINGS) IMPLANT
DRSG OPSITE POSTOP 4X10 (GAUZE/BANDAGES/DRESSINGS) ×3 IMPLANT
DRSG PEEL AND PLACE PREVENA 20 (GAUZE/BANDAGES/DRESSINGS) ×2
ELECT REM PT RETURN 9FT ADLT (ELECTROSURGICAL) ×2
ELECTRODE REM PT RTRN 9FT ADLT (ELECTROSURGICAL) ×2 IMPLANT
EXTRACTOR VACUUM M CUP 4 TUBE (SUCTIONS) IMPLANT
GLOVE BIOGEL PI IND STRL 7.0 (GLOVE) ×4 IMPLANT
GLOVE BIOGEL PI INDICATOR 7.0 (GLOVE) ×2
GLOVE ECLIPSE 6.5 STRL STRAW (GLOVE) ×3 IMPLANT
GOWN STRL REUS W/TWL LRG LVL3 (GOWN DISPOSABLE) ×6 IMPLANT
KIT ABG SYR 3ML LUER SLIP (SYRINGE) IMPLANT
NDL HYPO 25X5/8 SAFETYGLIDE (NEEDLE) IMPLANT
NEEDLE HYPO 22GX1.5 SAFETY (NEEDLE) ×3 IMPLANT
NEEDLE HYPO 25X5/8 SAFETYGLIDE (NEEDLE) IMPLANT
NS IRRIG 1000ML POUR BTL (IV SOLUTION) ×3 IMPLANT
PACK C SECTION WH (CUSTOM PROCEDURE TRAY) ×3 IMPLANT
PAD OB MATERNITY 4.3X12.25 (PERSONAL CARE ITEMS) ×3 IMPLANT
RTRCTR C-SECT PINK 25CM LRG (MISCELLANEOUS) IMPLANT
STRIP CLOSURE SKIN 1/2X4 (GAUZE/BANDAGES/DRESSINGS) IMPLANT
SUT CHROMIC GUT AB #0 18 (SUTURE) IMPLANT
SUT MNCRL 0 VIOLET CTX 36 (SUTURE) ×6 IMPLANT
SUT MON AB 2-0 SH 27 (SUTURE)
SUT MON AB 2-0 SH27 (SUTURE) IMPLANT
SUT MON AB 3-0 SH 27 (SUTURE)
SUT MON AB 3-0 SH27 (SUTURE) IMPLANT
SUT MON AB 4-0 PS1 27 (SUTURE) IMPLANT
SUT MONOCRYL 0 CTX 36 (SUTURE) ×6
SUT PLAIN 2 0 (SUTURE)
SUT PLAIN 2 0 XLH (SUTURE) IMPLANT
SUT PLAIN ABS 2-0 CT1 27XMFL (SUTURE) IMPLANT
SUT VIC AB 0 CT1 36 (SUTURE) ×6 IMPLANT
SUT VIC AB 2-0 CT1 27 (SUTURE) ×2
SUT VIC AB 2-0 CT1 TAPERPNT 27 (SUTURE) ×2 IMPLANT
SUT VIC AB 4-0 PS2 27 (SUTURE) IMPLANT
SYR CONTROL 10ML LL (SYRINGE) ×3 IMPLANT
TOWEL OR 17X24 6PK STRL BLUE (TOWEL DISPOSABLE) ×3 IMPLANT
TRAY FOLEY W/BAG SLVR 14FR LF (SET/KITS/TRAYS/PACK) IMPLANT
WATER STERILE IRR 1000ML POUR (IV SOLUTION) ×3 IMPLANT

## 2021-10-15 NOTE — H&P (Signed)
Joann [name] is a 41 y.o. female presenting for Primary C/S @ 37 wk due to previous myomectomy. PNC notable for morbid obesity, IVF pregnancy, chronic HTN no med.  GBS cx neg. BMZ complete( 6/8, 6/9). OB History     Gravida  1   Para  0   Term  0   Preterm  0   AB  0   Living  0      SAB  0   IAB  0   Ectopic  0   Multiple  0   Live Births  0          Past Medical History:  Diagnosis Date   Anxiety    Fibroids    Pregnancy induced hypertension    Past Surgical History:  Procedure Laterality Date   HYSTEROSCOPY     MYOMECTOMY     Family History: family history includes Asthma in her mother and sister; Cancer in her maternal aunt; Diabetes in her father and paternal grandmother; Heart disease in her maternal uncle; Hypertension in her father, maternal grandmother, and mother; Stroke in her maternal aunt and paternal grandmother. Social History:  reports that she has never smoked. She has never used smokeless tobacco. She reports that she does not currently use alcohol. She reports that she does not use drugs.     Maternal Diabetes: No Genetic Screening: Normal Maternal Ultrasounds/Referrals: Normal Fetal Ultrasounds or other Referrals:  Fetal echo, Referred to Materal Fetal Medicine  nl echo Maternal Substance Abuse:  No Significant Maternal Medications:  Meds include: Other:  baby ASA Significant Maternal Lab Results:  None and Group B Strep negative Other Comments:   IVF pregnancy. Morbid obesity,   chronic hTN no med Previous myomectomy. BMZ complete  Review of Systems  All other systems reviewed and are negative.  History   Last menstrual period 02/04/2021. Maternal Exam:  Introitus: Normal vulva.   Physical Exam Constitutional:      Appearance: Normal appearance.     Comments: Morbid obesity  HENT:     Head: Atraumatic.  Eyes:     Extraocular Movements: Extraocular movements intact.  Cardiovascular:     Rate and Rhythm: Regular  rhythm.     Heart sounds: Normal heart sounds.  Pulmonary:     Breath sounds: Normal breath sounds.  Genitourinary:    General: Normal vulva.  Musculoskeletal:     Cervical back: Neck supple.     Right lower leg: Edema present.     Left lower leg: Edema present.  Skin:    General: Skin is warm and dry.     Comments: Facial acne  Neurological:     General: No focal deficit present.     Mental Status: She is alert and oriented to person, place, and time.  Psychiatric:        Mood and Affect: Mood normal.        Behavior: Behavior normal.    Prenatal labs: ABO, Rh: --/--/B POS (06/12 0919) Antibody: NEG (06/12 0919) Rubella: Immune (12/19 0000) RPR: NON REACTIVE (06/12 0922)  HBsAg: Negative (12/19 0000)  HIV: Non-reactive (12/19 0000)  GBS: negative( 6/8) HCV neg   Assessment/Plan:  Previous myomectomy IUP @ 37 wk Morbid obesity BMI 51 Obesity affecting pregnancy Chronic hTN affecting pregnancy AMA P) Primary C/S. Routine labs. Cell saver.  Procedure explained. Risk of surgery reviewed including infection, bleeding, possible need for blood transfusion and its risk, injury to bladder, bowel, ureter. Internal scar tissue. All ?  answered   Doris Gruhn A Joey Lierman 10/15/2021, 4:20 AM

## 2021-10-15 NOTE — Transfer of Care (Signed)
Immediate Anesthesia Transfer of Care Note  Patient: Joann [name]  Procedure(s) Performed: CESAREAN SECTION APPLICATION OF CELL SAVER  Patient Location: PACU  Anesthesia Type:Spinal  Level of Consciousness: awake, alert  and oriented  Airway & Oxygen Therapy: Patient Spontanous Breathing  Post-op Assessment: Report given to RN and Post -op Vital signs reviewed and stable  Post vital signs: Reviewed and stable  Last Vitals:  Vitals Value Taken Time  BP 127/71 10/15/21 1947  Temp    Pulse 96 10/15/21 1951  Resp    SpO2 94 % 10/15/21 1951  Vitals shown include unvalidated device data.  Last Pain:  Vitals:   10/15/21 1531  TempSrc: Oral  PainSc: 0-No pain      Patients Stated Pain Goal: 7 (62/56/38 9373)  Complications: No notable events documented.

## 2021-10-15 NOTE — Op Note (Signed)
NAME: Joann [name], Joann [name] MEDICAL RECORD NO: [id number] ACCOUNT NO: [id number] DATE OF BIRTH: March 20, 1981 FACILITY: [facility] LOCATION: [facility] PHYSICIAN: Ren Aspinall A. Garwin Brothers, MD  Operative Report   DATE OF PROCEDURE: 10/15/2021  PREOPERATIVE DIAGNOSES:  Previous myomectomy, intrauterine gestation at 24 weeks, morbid obesity, fibroid uterus, advanced maternal age, IVF pregnancy, chronic hypertension.  PROCEDURE:  Primary cesarean section, Kerr hysterotomy.  POSTOPERATIVE DIAGNOSES:  Previous myomectomy, intrauterine gestation at 37 weeks, morbid obesity, fibroid uterus, advanced maternal age, IVF pregnancy, chronic hypertension.  ANESTHESIA:  Spinal.  SURGEON:  Pricella Gaugh A. Garwin Brothers, MD.  ASSISTANT:  Arlina Robes, MD  DESCRIPTION OF PROCEDURE:  Under adequate spinal anesthesia, the patient was placed in the supine position with a left lateral tilt.  An indwelling Foley catheter was sterilely placed.  Traxi retractor for the large pannus was placed.  The patient was  then sterilely prepped and draped in the usual fashion.  A 0.25% Marcaine was injected along the planned Pfannenstiel skin incision site.  Pfannenstiel skin incision was then made, carried down to the rectus fascia.  Rectus fascia was opened  transversely.  Rectus fascia was then bluntly and sharply dissected off the rectus muscle in a superior and inferior fashion.  The rectus muscle was split in the midline.  The parietal peritoneum was entered sharply and extended. A self-retaining Alexis  retractor was then placed.  The lower uterine segment had some adhesions from her prior myomectomy.  The vesicouterine peritoneum was opened transversely and with lysis of some of the adhesions and displacement of the bladder.  A transverse incision was  made. Cell Saver was used intraoperatively.  The incision was carried down to the intact amniotic sac, at which time the incision was then extended bluntly in a cephalic and caudad manner.   Artificial rupture of membranes was then accomplished.  Clear  amniotic fluid noted.  Subsequent delivery of a live female from a right occiput transverse position was delivered.  Delayed cord clamp x1 minute.  The cord was then clamped, cut.  The baby was transferred to the waiting pediatricians who assigned Apgars  8 and 9 at one and five minutes.  The uterus was massaged. Small fibroids could be palpated, submucosal and pedunculated.  Finally manual extraction of the placenta was accomplished. The uterine cavity was cleaned and swept of debris using both digital  as well as sponge. The uterine incision had no extension.  Uterine incision was closed in 2 layers, the first layer was 0 Monocryl running locked stitch, second layer was imbricated using 0 Monocryl suture.  Two areas that required additional sutures of  figure-of-eight with good hemostasis.  The abdomen was irrigated, suctioned of debris.  Interceed was placed overlying the incision in an inverted T fashion.  The Ubaldo Glassing was removed.  The right adnexa had some evidence of adhesions, small visibility of  the right ovary was noted.  Despite palpation and exploration, the left adnexa could not be seen due to some adhesions on the left side. It was not further explored. The omentum was more displaced to that side as well.  The parietal peritoneum was then closed  with 2-0 Vicryl.  The rectus fascia was closed with 0 Vicryl x2.  The subcutaneous area was irrigated, small bleeders cauterized and the subcutaneous area due to its 2+ inch depth was closed in multiple layers using 2-0 plain sutures and the skin  approximated using 4-0 Monocryl suture.  A Prevena wound vacuum was placed.  The patient has on the mons  on the lower right aspect of the incision a caruncle and so a coverderm was placed over one boil on the right and then the Prevena was placed over the incision.  SPECIMEN:  Placenta, not sent to pathology.  ESTIMATED BLOOD LOSS:  685  mL  INTRAOPERATIVE FLUIDS:  2600 mL  URINE OUTPUT:  250 mL  COUNTS:  Sponge and instrument counts x2 was correct.  COMPLICATIONS:  None.   The patient tolerated the procedure well and was transferred to recovery room in stable condition.   PAA D: 10/15/2021 7:36:40 pm T: 10/15/2021 11:02:00 pm  JOB: [id number]/ [id number]

## 2021-10-15 NOTE — Anesthesia Preprocedure Evaluation (Addendum)
Anesthesia Evaluation  Patient identified by MRN, date of birth, ID band Patient awake    Reviewed: Allergy & Precautions, NPO status , Patient's Chart, lab work & pertinent test results  History of Anesthesia Complications Negative for: history of anesthetic complications  Airway Mallampati: I  TM Distance: >3 FB Neck ROM: Full    Dental  (+) Dental Advisory Given, Chipped   Pulmonary asthma ,    Pulmonary exam normal        Cardiovascular hypertension (gHTN), Normal cardiovascular exam     Neuro/Psych PSYCHIATRIC DISORDERS Anxiety negative neurological ROS     GI/Hepatic negative GI ROS, Neg liver ROS,   Endo/Other  Morbid obesity  Renal/GU negative Renal ROS     Musculoskeletal negative musculoskeletal ROS (+)   Abdominal (+) + obese,   Peds  Hematology  (+) Blood dyscrasia, anemia ,   Anesthesia Other Findings   Reproductive/Obstetrics (+) Pregnancy                            Anesthesia Physical Anesthesia Plan  ASA: 3  Anesthesia Plan: Spinal   Post-op Pain Management: Regional block*   Induction:   PONV Risk Score and Plan: 2 and Treatment may vary due to age or medical condition, Ondansetron and Scopolamine patch - Pre-op  Airway Management Planned: Natural Airway  Additional Equipment: None  Intra-op Plan:   Post-operative Plan:   Informed Consent: I have reviewed the patients History and Physical, chart, labs and discussed the procedure including the risks, benefits and alternatives for the proposed anesthesia with the patient or authorized representative who has indicated his/her understanding and acceptance.       Plan Discussed with: CRNA and Anesthesiologist  Anesthesia Plan Comments: (Labs reviewed, platelets acceptable. Discussed risks and benefits of spinal, including spinal/epidural hematoma, infection, failed block, and PDPH. Patient expressed  understanding and wished to proceed. )       Anesthesia Quick Evaluation

## 2021-10-15 NOTE — Brief Op Note (Signed)
10/15/2021  7:47 PM  PATIENT:  Joann [name]  41 y.o. female  PRE-OPERATIVE DIAGNOSIS: Previous Myomectomy, IUP @ 37 wk, obesity affecting pregnancy, chronic HTN, Fibroid uterus, AMA, IVF pregnancy  POST-OPERATIVE DIAGNOSIS:  same  PROCEDURE:  Primary Cesarean section, kerr hysterotomy  SURGEON:  Surgeon(s) and Role:    * Servando Salina, MD - Primary    * Chancy Milroy, MD - Assisting  PHYSICIAN ASSISTANT: n/a  ASSISTANTS: Arlina Robes, MD   ANESTHESIA:   spinal FINDINGS; LIVE FEMALE ROT  Apgars 8/9. Fibroid uterus EBL:  685 ml   BLOOD ADMINISTERED:none  DRAINS: none   LOCAL MEDICATIONS USED:  MARCAINE     SPECIMEN:  No Specimen  DISPOSITION OF SPECIMEN:  N/A  COUNTS:  YES  TOURNIQUET:  * No tourniquets in log *  DICTATION: .Other Dictation: Dictation Number [id number]  PLAN OF CARE: Admit to inpatient   PATIENT DISPOSITION:  PACU - hemodynamically stable.   Delay start of Pharmacological VTE agent (>24hrs) due to surgical blood loss or risk of bleeding: no

## 2021-10-15 NOTE — Interval H&P Note (Signed)
History and Physical Interval Note:  10/15/2021 5:12 PM  Joann [name]  has presented today for surgery, with the diagnosis of primary cesarean section history myomectomy Pt is 315lbs; cell saver.  The various methods of treatment have been discussed with the patient and family. After consideration of risks, benefits and other options for treatment, the patient has consented to  Procedure(s): CESAREAN SECTION (N/A) APPLICATION OF CELL SAVER (N/A) as a surgical intervention.  The patient's history has been reviewed, patient examined, no change in status, stable for surgery.  I have reviewed the patient's chart and labs.  Questions were answered to the patient's satisfaction.     [name] A Allex Lapoint

## 2021-10-15 NOTE — Anesthesia Postprocedure Evaluation (Signed)
Anesthesia Post Note  Patient: FALINE LANGER  Procedure(s) Performed: CESAREAN SECTION APPLICATION OF CELL SAVER     Patient location during evaluation: PACU Anesthesia Type: Spinal Level of consciousness: awake and alert Pain management: pain level controlled Vital Signs Assessment: post-procedure vital signs reviewed and stable Respiratory status: spontaneous breathing, nonlabored ventilation and respiratory function stable Cardiovascular status: blood pressure returned to baseline and stable Postop Assessment: no apparent nausea or vomiting Anesthetic complications: no   No notable events documented.  Last Vitals:  Vitals:   10/15/21 2100 10/15/21 2214  BP: 133/81 135/90  Pulse: 98 100  Resp: 18 17  Temp: 37 C 36.7 C  SpO2: 95%     Last Pain:  Vitals:   10/15/21 2214  TempSrc: Oral  PainSc:    Pain Goal: Patients Stated Pain Goal: 7 (10/15/21 1531)              Epidural/Spinal Function Cutaneous sensation: Able to Wiggle Toes (10/15/21 2213), Patient able to flex knees: Yes (10/15/21 2213), Patient able to lift hips off bed: No (10/15/21 2213), Back pain beyond tenderness at insertion site: No (10/15/21 2213), Progressively worsening motor and/or sensory loss: No (10/15/21 2213), Bowel and/or bladder incontinence post epidural: No (10/15/21 2213)  Lynda Rainwater

## 2021-10-15 NOTE — Anesthesia Procedure Notes (Signed)
Spinal  Patient location during procedure: OR Start time: 10/15/2021 5:42 PM End time: 10/15/2021 5:52 PM Reason for block: surgical anesthesia Staffing Performed: anesthesiologist  Anesthesiologist: Audry Pili, MD Performed by: Audry Pili, MD Authorized by: Audry Pili, MD   Preanesthetic Checklist Completed: patient identified, IV checked, risks and benefits discussed, surgical consent, monitors and equipment checked, pre-op evaluation and timeout performed Spinal Block Patient position: sitting Prep: DuraPrep Patient monitoring: heart rate, cardiac monitor, continuous pulse ox and blood pressure Approach: midline Location: L2-3 Injection technique: single-shot Needle Needle type: Pencan and Quincke  Needle gauge: 22 G Needle length: 15 cm Additional Notes Consent was obtained prior to the procedure with all questions answered and concerns addressed. Risks including, but not limited to, bleeding, infection, nerve damage, paralysis, failed block, inadequate analgesia, allergic reaction, high spinal, itching, and headache were discussed and the patient wished to proceed. Functioning IV was confirmed and monitors were applied. Sterile prep and drape, including hand hygiene, mask, and sterile gloves were used. The patient was positioned and the spine was prepped. The skin was anesthetized with lidocaine. Free flow of clear CSF was obtained prior to injecting local anesthetic into the CSF. The spinal needle aspirated freely following injection. The needle was carefully withdrawn. The patient tolerated the procedure well.   Renold Don, MD

## 2021-10-15 NOTE — Lactation Note (Signed)
This note was copied from a baby's chart. Lactation Consultation Note  Patient Name: Girl Kaisyn Reinhold [id number]'V Date: 10/15/2021 Reason for consult: Initial assessment;Difficult latch;1st time breastfeeding;Early term 37-38.6wks;Other (Comment) (C/S delivery) Age:42 hours Mom's current feeding choice is breastfeeding and supplementing with donor breast milk. LC entered room infant cuing to breast feed. Mom latched infant on her right breast using the football hold position, infant was on and off breast not sustaining latch, infant breastfeed for 11 minutes. LC observed infant sucks on tongue and work with mom, flanging infant's lips outward and extending infant's lower jaw, see mom's nipple type below in latch assessment. The football hold is good BF position, mom does have large nipples and areolas. LC used breast model to demonstrate hand expression, mom taught back but colostrum not present at this time, LC encourage mom to keep latching infant at breast for breast stimulation to establish milk supply. Family choice was to supplement infant with donor milk, dad supplemented infant using slow flow yellow nipple pace feeding infant, while mom was using the DEBP, fitted with 27 mm breast flange, mom was pumping when LC left the room. Mom knows to continue to breastfeed infant according to hunger cues, on demand, 8 to 12+ times within 24 hours, skin to skin. Mom made aware of O/P services, breastfeeding support groups, community resources, and our phone # for post-discharge questions.   Mom's plan: 1- Pre-pump with hand pump prior to latching infant at the breast, continue to BF infant according to hunger cues, ask RN/LC for latch assistance if needed. 2- Supplement infant with donor breast milk after latching infant at breast for each feeding , BF supplemental sheet given Day 1 ( 5-7 mls) per feeding. 3- Dad plans to supplement infant while mom is using DEBP. Maternal Data Has patient been taught  Hand Expression?: Yes Does the patient have breastfeeding experience prior to this delivery?: No  Feeding Mother's Current Feeding Choice: Breast Milk and Donor Milk  LATCH Score Latch: Repeated attempts needed to sustain latch, nipple held in mouth throughout feeding, stimulation needed to elicit sucking reflex.  Audible Swallowing: None  Type of Nipple: Inverted  Comfort (Breast/Nipple): Soft / non-tender  Hold (Positioning): Assistance needed to correctly position infant at breast and maintain latch.  LATCH Score: 4   Lactation Tools Discussed/Used Tools: Flanges;Pump Flange Size: 27 Breast pump type: Double-Electric Breast Pump Pump Education: Setup, frequency, and cleaning;Milk Storage Reason for Pumping: ETI female infant, colostrum not present, infant was on and off breast, fussy and hungry. Pumping frequency: Mom will use DEBP every 3 hours for 15 minutes on inital setting.  Interventions Interventions: Breast feeding basics reviewed;Assisted with latch;Skin to skin;Adjust position;Breast compression;Support pillows;Position options;Expressed milk;DEBP;Education;Pace feeding;LC Services brochure  Discharge    Consult Status Consult Status: Follow-up Date: 10/16/21 Follow-up type: In-patient    Vicente Serene 10/15/2021, 10:19 PM

## 2021-10-16 ENCOUNTER — Ambulatory Visit: Payer: No Typology Code available for payment source

## 2021-10-16 ENCOUNTER — Encounter (HOSPITAL_COMMUNITY): Payer: Self-pay | Admitting: Obstetrics and Gynecology

## 2021-10-16 LAB — COMPREHENSIVE METABOLIC PANEL
ALT: 14 U/L (ref 0–44)
AST: 17 U/L (ref 15–41)
Albumin: 2.5 g/dL — ABNORMAL LOW (ref 3.5–5.0)
Alkaline Phosphatase: 101 U/L (ref 38–126)
Anion gap: 7 (ref 5–15)
BUN: 8 mg/dL (ref 6–20)
CO2: 24 mmol/L (ref 22–32)
Calcium: 9.5 mg/dL (ref 8.9–10.3)
Chloride: 105 mmol/L (ref 98–111)
Creatinine, Ser: 0.68 mg/dL (ref 0.44–1.00)
GFR, Estimated: >60 mL/min (ref >60)
Glucose, Bld: 93 mg/dL (ref 70–99)
Potassium: 3.9 mmol/L (ref 3.5–5.1)
Sodium: 136 mmol/L (ref 135–145)
Total Bilirubin: 0.3 mg/dL (ref 0.3–1.2)
Total Protein: 6.1 g/dL — ABNORMAL LOW (ref 6.5–8.1)

## 2021-10-16 LAB — PROTEIN / CREATININE RATIO, URINE
Creatinine, Urine: 116.63 mg/dL
Protein Creatinine Ratio: 0.13 mg/mg{Cre} (ref 0.00–0.15)
Total Protein, Urine: 15 mg/dL

## 2021-10-16 LAB — CBC
HCT: 34.2 % — ABNORMAL LOW (ref 36.0–46.0)
Hemoglobin: 11.3 g/dL — ABNORMAL LOW (ref 12.0–15.0)
MCH: 26.7 pg (ref 26.0–34.0)
MCHC: 33 g/dL (ref 30.0–36.0)
MCV: 80.7 fL (ref 80.0–100.0)
Platelets: 214 10*3/uL (ref 150–400)
RBC: 4.24 MIL/uL (ref 3.87–5.11)
RDW: 14.8 % (ref 11.5–15.5)
WBC: 13.9 10*3/uL — ABNORMAL HIGH (ref 4.0–10.5)
nRBC: 0 % (ref 0.0–0.2)

## 2021-10-16 MED ORDER — NIFEDIPINE ER OSMOTIC RELEASE 30 MG PO TB24
30.0000 mg | ORAL_TABLET | Freq: Every day | ORAL | Status: DC
Start: 2021-10-16 — End: 2021-10-18
  Administered 2021-10-16 – 2021-10-18 (×3): 30 mg via ORAL
  Filled 2021-10-16 (×3): qty 1

## 2021-10-16 NOTE — Progress Notes (Signed)
SVD: primary  S:  Pt reports feeling well. Denies h/a, visual changes or epigastric pain/ Tolerating po/ Voiding without problems/ No n/v/ Bleeding is light/ Pain controlled withacetaminophen, prescription NSAID's including ibuprofen (Motrin), and narcotic analgesics including oxycodone/acetaminophen (Percocet, Tylox)    O:  A & O x 3 / VS: Blood pressure (!) 147/83, pulse 97, temperature 99 F (37.2 C), temperature source Oral, resp. rate 16, height '5\' 5"'$  (1.651 m), weight (!) 140.3 kg, last menstrual period 02/04/2021, SpO2 98 %, unknown if currently breastfeeding.  LABS:  Results for orders placed or performed during the hospital encounter of 10/15/21 (from the past 24 hour(s))  ABO/Rh     Status: None   Collection Time: 10/15/21  4:23 PM  Result Value Ref Range   ABO/RH(D)      B POS Performed at [facility], [place] [address] [place], [place] [address]   CBC     Status: Abnormal   Collection Time: 10/16/21  5:19 AM  Result Value Ref Range   WBC 13.9 (H) 4.0 - 10.5 K/uL   RBC 4.24 3.87 - 5.11 MIL/uL   Hemoglobin 11.3 (L) 12.0 - 15.0 g/dL   HCT 34.2 (L) 36.0 - 46.0 %   MCV 80.7 80.0 - 100.0 fL   MCH 26.7 26.0 - 34.0 pg   MCHC 33.0 30.0 - 36.0 g/dL   RDW 14.8 11.5 - 15.5 %   Platelets 214 150 - 400 K/uL   nRBC 0.0 0.0 - 0.2 %    I&O: I/O last 3 completed shifts: In: 2300 [I.V.:2300] Out: 1360 [Urine:675; Blood:685]   Total I/O In: -  Out: 150 [Urine:150]  Lungs: chest clear, no wheezing, rales, normal symmetric air entry  Heart: regular rate and rhythm, S1, S2 normal, no murmur, click, rub or gallop  Abdomen: obese soft uterus firm at umb Dressing ( prevana wound dressing  Perineum: not inspected  Lochia: light  Extremities:no redness or tenderness in the calves or thighs, no edema    A/P: POD # 1/PPD # 1/ G1P1001 Chronic HTN   r/o superimposed preeclampsia Morbid obesity  Doing well  Continue routine post partum orders  Check CMP,  urine PCR Start  procardia XL '30mg'$  po qd

## 2021-10-16 NOTE — Social Work (Signed)
CSW received consult for hx of Anxiety.  CSW met with [name] to offer support and complete assessment.    CSW met with [name] at bedside and introduced CSW role. CSW observed [name] sitting in the room holding the infant. [name] presented calm and receptive to [place] visit. CSW inquired how [name] has felt since giving birth. [name] reported feeling good and shared the L&D was not as bad as she thought it would be. [name] reported she was diagnosed with anxiety as a teenager and has not had any concerns since high school. [name] reported feeling excited and a little scared during the pregnancy but felt they were all normal emotions in preparing for the infant's birth. [name] expressed, "I take things one day at a time and try not to stress about things I cannot control." CSW acknowledged [name] efforts. [name] identified her parents and significant other "Lamont" as supports. CSW discussed PPD symptoms and provided resources for follow up.   CSW provided education regarding the baby blues period vs. perinatal mood disorders, discussed treatment and gave resources for mental health follow up if concerns arise.  CSW recommended self-evaluation during the postpartum time period using the New Mom Checklist from Postpartum Progress and encouraged [name] to contact a medical professional if symptoms are noted at any time.    [name] reported she all items for the infant including a bassinet where the infant will sleep. CSW provided review of Sudden Infant Death Syndrome (SIDS) precautions.  [name] has chosen [facility] for the infant's follow up care. CSW assessed [name] for further need. [name] denied additional needs.   CSW identifies no further need for intervention and no barriers to discharge at this time.   Joann [name], MSW, LCSW Women's and [place] Worker  [phone] Sep 16, 2021  12:06 PM

## 2021-10-16 NOTE — Lactation Note (Signed)
This note was copied from a baby's chart. Lactation Consultation Note  Patient Name: Girl Tacha Manni [id number] Date: 10/16/2021 Reason for consult: Follow-up assessment;Primapara;1st time breastfeeding;Early term 42-38.6wks Age:41 years  LC in to room for follow up. Mother reports pumping but unable to collect any colostrum. LC attempted to review hand expression and noted edema in both nipples. Provided shells and encouraged to use at all time unless sleeping, feeding or pumping.  Talked about formula feeding volume and baby's belly size. Demonstrated pacing when bottlefeeding, upright position and frequent burping. Baby transferred 81m of formula.  Discussed normal infant behavior, clusterfeeding, normal output. Reinforced pumping for 15 minutes after formula feeding  Feeding plan:  1-Feeding on demand or 8-12 times in 24h period  2-Pump after formula feedings  3-Encouraged maternal rest, hydration and food intake.   Contact LC as needed for feeds/support/concerns/questions. All questions answered at this time.    Feeding Mother's Current Feeding Choice: Breast Milk and Formula Nipple Type: Slow - flow  Lactation Tools Discussed/Used Tools: Pump;Bottle;Shells Breast pump type: Double-Electric Breast Pump Reason for Pumping: stimulation and supplementation  Interventions Interventions: Breast feeding basics reviewed;Skin to skin;Hand express;Breast massage;Shells;DEBP;Education;Pace feeding  Discharge Pump: Manual;Personal WIC Program: Yes  Consult Status Consult Status: Follow-up Date: 10/17/21 Follow-up type: In-patient    Hatem Cull A Higuera Ancidey 10/16/2021, 6:15 PM

## 2021-10-17 NOTE — Progress Notes (Signed)
SVD: primary  S:  Pt reports feeling well no complaint/ Tolerating po/ Voiding without problems/ No n/v/ Bleeding is light/ Pain controlled withprescription NSAID's including ibuprofen (Motrin) and narcotic analgesics including oxycodone/acetaminophen (Percocet, Tylox)    O:  A & O x 3 / VS: Blood pressure 139/78, pulse (!) 108, temperature 98.2 F (36.8 C), temperature source Oral, resp. rate 20, height '5\' 5"'$  (1.651 m), weight (!) 140.3 kg, last menstrual period 02/04/2021, SpO2 98 %, unknown if currently breastfeeding. Patient Vitals for the past 24 hrs:  BP Temp Temp src Pulse Resp SpO2  10/17/21 0929 139/78 -- -- (!) 108 -- --  10/17/21 0845 (!) 133/59 98.2 F (36.8 C) Oral (!) 120 20 98 %  10/17/21 0530 118/77 99.5 F (37.5 C) Oral (!) 108 17 --  10/16/21 2102 119/65 98.6 F (37 C) Oral (!) 101 16 98 %  10/16/21 1300 (!) 147/83 99 F (37.2 C) Oral 97 16 98 %     LABS:  Results for orders placed or performed during the hospital encounter of 10/15/21 (from the past 24 hour(s))  Comprehensive metabolic panel     Status: Abnormal   Collection Time: 10/16/21  2:01 PM  Result Value Ref Range   Sodium 136 135 - 145 mmol/L   Potassium 3.9 3.5 - 5.1 mmol/L   Chloride 105 98 - 111 mmol/L   CO2 24 22 - 32 mmol/L   Glucose, Bld 93 70 - 99 mg/dL   BUN 8 6 - 20 mg/dL   Creatinine, Ser 0.68 0.44 - 1.00 mg/dL   Calcium 9.5 8.9 - 10.3 mg/dL   Total Protein 6.1 (L) 6.5 - 8.1 g/dL   Albumin 2.5 (L) 3.5 - 5.0 g/dL   AST 17 15 - 41 U/L   ALT 14 0 - 44 U/L   Alkaline Phosphatase 101 38 - 126 U/L   Total Bilirubin 0.3 0.3 - 1.2 mg/dL   GFR, Estimated >60 >60 mL/min   Anion gap 7 5 - 15  Protein / creatinine ratio, urine     Status: None   Collection Time: 10/16/21  5:06 PM  Result Value Ref Range   Creatinine, Urine 116.63 mg/dL   Total Protein, Urine 15 mg/dL   Protein Creatinine Ratio 0.13 0.00 - 0.15 mg/mg[Cre]    I&O: I/O last 3 completed shifts: In: 300 [I.V.:300] Out: 975  [Urine:975]   No intake/output data recorded.  Lungs: chest clear, no wheezing, rales, normal symmetric air entry  Heart: regular rate and rhythm, S1, S2 normal, no murmur, click, rub or gallop  Abdomen: uterus firm 1FB above. Diff to assess due to obesity. Prevena wound dressing  Perineum: not inspected  Lochia: light  Extremities:no redness or tenderness in the calves or thighs, no edema    A/P: POD # 2/PPD # 2/ G1P1001 Chronic HTN on procardia. No evidence of superimposed preeclampsia  Doing well  Continue routine post partum orders  Cont current procardia dose Anticipate d/c in am

## 2021-10-17 NOTE — Progress Notes (Signed)
Patient was started on Procardia yesterday. B/P at 1435 today was 146/75. Repeated at 1527 and B/P was 143/82. Notified Dr. Garwin Brothers. No new orders.

## 2021-10-18 MED ORDER — IBUPROFEN 600 MG PO TABS: 600.0000 mg | ORAL_TABLET | Freq: Four times a day (QID) | ORAL | 11 refills | Status: DC | PRN

## 2021-10-18 MED ORDER — NIFEDIPINE ER 30 MG PO TB24: 30.0000 mg | ORAL_TABLET | Freq: Every day | ORAL | 11 refills | Status: DC

## 2021-10-18 MED ORDER — OXYCODONE HCL 5 MG PO TABS
5.0000 mg | ORAL_TABLET | Freq: Four times a day (QID) | ORAL | 0 refills | Status: AC | PRN
Start: 2021-10-18 — End: 2021-10-25

## 2021-10-18 NOTE — Progress Notes (Signed)
SVD: primary  S:  Pt reports feeling well denies PIH warning signs/ Tolerating po/ Voiding without problems/ No n/v/ Bleeding is light/ Pain controlled withprescription NSAID's including ibuprofen (Motrin) and narcotic analgesics including oxycodone/acetaminophen (Percocet, Tylox)    O:  A & O x 3 / VS: Blood pressure 128/71, pulse 98, temperature 98.9 F (37.2 C), temperature source Oral, resp. rate 16, height '5\' 5"'$  (1.651 m), weight (!) 140.3 kg, last menstrual period 02/04/2021, SpO2 96 %, unknown if currently breastfeeding.  LABS: No results found for this or any previous visit (from the past 24 hour(s)).  I&O: No intake/output data recorded.   No intake/output data recorded.  Lungs: chest clear, no wheezing, rales, normal symmetric air entry  Heart: regular rate and rhythm, S1, S2 normal, no murmur, click, rub or gallop  Abdomen: obese soft  uterus firm. Dressing intact  Perineum: not inspected  Lochia: light  Extremities:no redness or tenderness in the calves or thighs, edema 1+    A/P: POD # 3/PPD # 3/ [id number]  Primary C/S Chronic HTN on procardia XL  Doing well  Continue routine post partum orders  D/c instructions reviewed F/u next Friday for BP check and wound vacuum removal

## 2021-10-18 NOTE — Discharge Summary (Signed)
Postpartum Discharge Summary  Date of Service updated     Patient Name: Joann [name] DOB: 1980-12-18 MRN: [id number]  Date of admission: 10/15/2021 Delivery date:10/15/2021  Delivering provider: Ralph Brouwer  Date of discharge: 10/18/2021  Admitting diagnosis: previous myomectomy, IUP @ 37 wk Intrauterine pregnancy: [redacted]w[redacted]d     Secondary diagnosis:  Principal Problem:   Pregnancy with history of uterine myomectomy Active Problems:   Obesity affecting pregnancy in third trimester   Chronic hypertension in obstetric context in third trimester   Asthma affecting pregnancy in third trimester   Pregnancy resulting from assisted reproductive technology in third trimester   Postpartum care following cesarean delivery  Additional problems: n/a    Discharge diagnosis: Preterm Pregnancy Delivered, CHTN, and obesity affecting pregnancy, IVF pregnancy                                               Post partum procedures: n/a Augmentation: N/A Complications: None  Hospital course: Sceduled C/S   41 y.o. yo G1P1001 at [redacted]w[redacted]d was admitted to the hospital 10/15/2021 for scheduled cesarean section with the following indication:Prior Uterine Surgery.Delivery details are as follows:  Membrane Rupture Time/Date: 6:25 PM ,10/15/2021   Delivery Method:C-Section, Low Transverse  Details of operation can be found in separate operative note.  Patient had  postpartum course notable for starting Procardia XL due to elevation of BP. PIH labs were normal.  She is ambulating, tolerating a regular diet, passing flatus, and urinating well. Patient is discharged home in stable condition on  10/18/2021        Newborn Data: Birth date:10/15/2021  Birth time:6:26 PM  Gender:Female  Living status:Living  Apgars:8 ,9  Weight:3.62 kg     Magnesium Sulfate received: No BMZ received: No Rhophylac:No MMR:No T-DaP:Given prenatally Flu: No Transfusion:No  Physical exam  Vitals:   10/17/21 1435 10/17/21  1527 10/17/21 2157 10/18/21 0521  BP: (!) 146/75 (!) 143/82 121/64 128/71  Pulse: (!) 114 (!) 115 (!) 118 98  Resp:   18 16  Temp: 99.1 F (37.3 C)  99.6 F (37.6 C) 98.9 F (37.2 C)  TempSrc: Oral  Oral Oral  SpO2: 96%     Weight:      Height:       General: alert, cooperative, and no distress Lochia: appropriate Uterine Fundus: firm Incision: Dressing is clean, dry, and intact DVT Evaluation: No evidence of DVT seen on physical exam. Calf/Ankle edema is present Labs: Lab Results  Component Value Date   WBC 13.9 (H) 10/16/2021   HGB 11.3 (L) 10/16/2021   HCT 34.2 (L) 10/16/2021   MCV 80.7 10/16/2021   PLT 214 10/16/2021      Latest Ref Rng & Units 10/16/2021    2:01 PM  CMP  Glucose 70 - 99 mg/dL 93   BUN 6 - 20 mg/dL 8   Creatinine 4.71 - 8.55 mg/dL 0.15   Sodium 868 - 257 mmol/L 136   Potassium 3.5 - 5.1 mmol/L 3.9   Chloride 98 - 111 mmol/L 105   CO2 22 - 32 mmol/L 24   Calcium 8.9 - 10.3 mg/dL 9.5   Total Protein 6.5 - 8.1 g/dL 6.1   Total Bilirubin 0.3 - 1.2 mg/dL 0.3   Alkaline Phos 38 - 126 U/L 101   AST 15 - 41 U/L 17   ALT 0 -  44 U/L 14    Edinburgh Score:     No data to display            After visit meds:  Allergies as of 10/18/2021   Not on File      Medication List     STOP taking these medications    aspirin EC 81 MG tablet       TAKE these medications    albuterol 108 (90 Base) MCG/ACT inhaler Commonly known as: VENTOLIN HFA Inhale 2 puffs into the lungs every 4 (four) hours as needed for shortness of breath or cough.   ferrous sulfate 325 (65 FE) MG tablet Take 195 mg by mouth daily with breakfast.   Fish Oil 1200 MG Cpdr Take 1,200 mg by mouth daily.   folic acid 579 MCG tablet Commonly known as: FOLVITE Take 800 mcg by mouth daily.   ibuprofen 600 MG tablet Commonly known as: ADVIL Take 1 tablet (600 mg total) by mouth every 6 (six) hours as needed.   loratadine 10 MG tablet Commonly known as: CLARITIN Take  10 mg by mouth daily.   Magnesium Oxide 250 MG Tabs Take 250 mg by mouth daily.   multivitamin-prenatal 27-0.8 MG Tabs tablet Take 1 tablet by mouth daily at 12 noon. Dha and Folic Acid   NIFEdipine 30 MG 24 hr tablet Commonly known as: ADALAT CC Take 1 tablet (30 mg total) by mouth daily.   oxyCODONE 5 MG immediate release tablet Commonly known as: Oxy IR/ROXICODONE Take 1 tablet (5 mg total) by mouth every 6 (six) hours as needed for up to 7 days for moderate pain.   Vitamin D 50 MCG (2000 UT) Caps Take 4,000 Units by mouth daily. Gummy               Discharge Care Instructions  (From admission, onward)           Start     Ordered   10/18/21 0000  Discharge wound care:       Comments: Removal of wound dressing in office on Friday   10/18/21 0953             Discharge home in stable condition Infant Feeding: Bottle and Breast Infant Disposition:home with mother Discharge instruction: per After Visit Summary and Postpartum booklet. Activity: Advance as tolerated. Pelvic rest for 6 weeks.  Diet: low salt diet Anticipated Birth Control: Condoms Postpartum Appointment:1 week Additional Postpartum F/U: BP check 1 week and remove prevena wound vacuum dressing Future Appointments:No future appointments. Follow up Visit:  Follow-up Information     Servando Salina, MD Follow up in 6 week(s).   Specialty: Obstetrics and Gynecology Why: wound dressing removal on Friday Contact information: [address] [place] [address] [phone]                     10/18/2021 Marvene Staff, MD

## 2021-10-18 NOTE — Lactation Note (Signed)
This note was copied from a baby's chart. Lactation Consultation Note  Patient Name: Joann [name] [id number] Date: 10/18/2021 Reason for consult: Follow-up assessment;Primapara;1st time breastfeeding;Early term 37-38.6wks;Infant weight loss;Other (Comment);Difficult latch (5 % weight loss, baby has received mostly bottles.  per mom has a desire to breast feed and has pumped x 2 in the last 24 hours. Bbay recently fed, still awake/ offered to latch. attempt on;t , baby now asleep.) Age:42 hours Areola edematous and dry. LC recommended using coconut oil to the areolas when pumping to make hand expressing easier and to soften so baby can latch.  LC reviewed supply and demand / importance of consistent breast stimulation if she has a desire to establish her milk supply and latch her baby.  Baby has gotten use to the quick flow from the bottles and since the milk is not in probably will have a difficult staying interested in staying latched.  LC BF D/C plan:  Coconut oil to areolas to help soften ( a dab will do )  Work on the pumping consistently to enhance the flow and establish the milk supply.  In the mean time- you can work on latching  If the baby won't latch - give the baby and appetizer of EBM or formula from the bottle , ease out and try to latch - if the baby still won't latch finish with the bottle .  Post pump both breast for 15 -20 mins / save milk for the next feeding.  LC reviewed prevention and tx of engorgement LC placed a request for the [facility] to call her to set up an appt.  Mom aware she will receive a call next Monday or Tuesday and has the phone number.   Maternal Data    Feeding Mother's Current Feeding Choice: Breast Milk and Formula Nipple Type: Slow - flow  LATCH Score Latch: Too sleepy or reluctant, no latch achieved, no sucking elicited.  Audible Swallowing: None  Type of Nipple: Everted at rest and after stimulation (areola edema)  Comfort (Breast/Nipple):  Soft / non-tender  Hold (Positioning): Assistance needed to correctly position infant at breast and maintain latch.  LATCH Score: 5   Lactation Tools Discussed/Used    Interventions    Discharge Discharge Education: Engorgement and breast care;Warning signs for feeding baby;Outpatient recommendation;Outpatient Epic message sent Pump: DEBP;Personal;Manual  Consult Status Consult Status: Complete Date: 10/18/21    Myer Haff 10/18/2021, 10:36 AM

## 2021-10-18 NOTE — Discharge Instructions (Signed)
Call if temperature greater than equal to 100.4, nothing per vagina for 4-6 weeks or severe nausea vomiting, increased incisional pain , drainage or redness in the incision site, no straining with bowel movements, showers no bath °

## 2021-10-22 MED FILL — Heparin Sodium (Porcine) Inj 1000 Unit/ML: INTRAMUSCULAR | Qty: 30 | Status: AC

## 2021-10-22 MED FILL — Sodium Chloride IV Soln 0.9%: INTRAVENOUS | Qty: 1000 | Status: AC

## 2021-10-23 ENCOUNTER — Ambulatory Visit: Payer: No Typology Code available for payment source

## 2021-10-23 ENCOUNTER — Telehealth (HOSPITAL_COMMUNITY): Payer: Self-pay

## 2021-10-23 DIAGNOSIS — Z4889 Encounter for other specified surgical aftercare: Secondary | ICD-10-CM | POA: Diagnosis not present

## 2021-10-23 DIAGNOSIS — O8600 Infection of obstetric surgical wound, unspecified: Secondary | ICD-10-CM | POA: Diagnosis not present

## 2021-10-23 NOTE — Telephone Encounter (Signed)
"  Doing pretty good." Patient declines questions or concerns about her healing.  "She's doing good. She is back up to her birthweight. We have been to the pediatrician twice and they said she looks good. She sleeps in a bassinet." RN reviewed ABC's of safe sleep with patient. Patient declines any questions or concerns about baby.  EPDS score is 1.  Sharyn Lull [facility] 06/22//2023,1341

## 2021-10-25 ENCOUNTER — Other Ambulatory Visit: Payer: Self-pay

## 2021-10-25 ENCOUNTER — Inpatient Hospital Stay (HOSPITAL_COMMUNITY)
Admission: AD | Admit: 2021-10-25 | Discharge: 2021-10-25 | Disposition: A | Discharge status: Home / Self Care | Payer: No Typology Code available for payment source | Attending: Obstetrics and Gynecology | Admitting: Obstetrics and Gynecology

## 2021-10-25 ENCOUNTER — Encounter (HOSPITAL_COMMUNITY): Payer: Self-pay | Admitting: Obstetrics and Gynecology

## 2021-10-25 DIAGNOSIS — O135 Gestational [pregnancy-induced] hypertension without significant proteinuria, complicating the puerperium: Secondary | ICD-10-CM | POA: Insufficient documentation

## 2021-10-25 DIAGNOSIS — O9089 Other complications of the puerperium, not elsewhere classified: Secondary | ICD-10-CM

## 2021-10-25 DIAGNOSIS — T8189XA Other complications of procedures, not elsewhere classified, initial encounter: Secondary | ICD-10-CM

## 2021-10-25 DIAGNOSIS — O8609 Infection of obstetric surgical wound, other surgical site: Secondary | ICD-10-CM | POA: Insufficient documentation

## 2021-10-25 MED ORDER — LIDOCAINE HCL (PF) 1 % IJ SOLN
INTRAMUSCULAR | Status: AC
  Administered 2021-10-25: 30 mL
  Filled 2021-10-25: qty 30

## 2021-10-25 MED ORDER — DAKINS (FULL STRENGTH) SOLUTION 0.5%
Freq: Once | CUTANEOUS | Status: AC
  Filled 2021-10-25: qty 1000

## 2021-10-25 MED ORDER — LIDOCAINE HCL URETHRAL/MUCOSAL 2 % EX GEL
1.0000 | Freq: Once | CUTANEOUS | Status: DC
  Filled 2021-10-25: qty 6

## 2021-10-25 NOTE — MAU Provider Note (Addendum)
History     Chief Complaint  Patient presents with   Post-op Problem   41 yo G1P1 MBF s/p C/S 6/14 due to previous myomectomy presents with complaint of wound drainage. Pt is morbidly obese and had Prevena wound vacuum placement 6/16. The dressing was removed on Thursday with the patient noting some drainage at the time. Incision was not fluctuant. Some erythema at the upper part of the pannus. Qtip size opening on the right of the incision with remaining incision well approximated and not draining. Pt  also had boils on the mons and left inguinal area.  The one on the mons was superficial ulceration w/o drainage. Pt denied pain or fever then and now. She was started on Bactrim. Pt notes increased drainage today.   OB History     Gravida  1   Para  1   Term  1   Preterm  0   AB  0   Living  1      SAB  0   IAB  0   Ectopic  0   Multiple  0   Live Births  1           Past Medical History:  Diagnosis Date   Anxiety    Fibroids    Pregnancy induced hypertension     Past Surgical History:  Procedure Laterality Date   CESAREAN SECTION N/A 10/15/2021   Procedure: CESAREAN SECTION;  Surgeon: Maxie Better, MD;  Location: [facility] LD ORS;  Service: Obstetrics;  Laterality: N/A;   HYSTEROSCOPY     MYOMECTOMY      Family History  Problem Relation Age of Onset   Asthma Mother    Hypertension Mother    Diabetes Father    Hypertension Father    Asthma Sister    Cancer Maternal Aunt    Stroke Maternal Aunt    Heart disease Maternal Uncle    Hypertension Maternal Grandmother    Stroke Paternal Grandmother    Diabetes Paternal Grandmother     Social History   Tobacco Use   Smoking status: Never   Smokeless tobacco: Never  Vaping Use   Vaping Use: Never used  Substance Use Topics   Alcohol use: Not Currently    Comment: not while preg   Drug use: No    Allergies: Not on File  Medications Prior to Admission  Medication Sig Dispense Refill Last Dose    albuterol (VENTOLIN HFA) 108 (90 Base) MCG/ACT inhaler Inhale 2 puffs into the lungs every 4 (four) hours as needed for shortness of breath or cough.      Cholecalciferol (VITAMIN D) 50 MCG (2000 UT) CAPS Take 4,000 Units by mouth daily. Gummy      ferrous sulfate 325 (65 FE) MG tablet Take 195 mg by mouth daily with breakfast.      folic acid (FOLVITE) 800 MCG tablet Take 800 mcg by mouth daily.      ibuprofen (ADVIL) 600 MG tablet Take 1 tablet (600 mg total) by mouth every 6 (six) hours as needed. 30 tablet 11    loratadine (CLARITIN) 10 MG tablet Take 10 mg by mouth daily.      Magnesium Oxide 250 MG TABS Take 250 mg by mouth daily.      NIFEdipine (ADALAT CC) 30 MG 24 hr tablet Take 1 tablet (30 mg total) by mouth daily. 30 tablet 11    Omega-3 Fatty Acids (FISH OIL) 1200 MG CPDR Take 1,200 mg by mouth daily.  oxyCODONE (OXY IR/ROXICODONE) 5 MG immediate release tablet Take 1 tablet (5 mg total) by mouth every 6 (six) hours as needed for up to 7 days for moderate pain. 30 tablet 0    Prenatal Vit-Fe Fumarate-FA (MULTIVITAMIN-PRENATAL) 27-0.8 MG TABS tablet Take 1 tablet by mouth daily at 12 noon. Dha and Folic Acid        Physical Exam   Blood pressure (!) 138/58, pulse (!) 55, temperature 98.8 F (37.1 C), temperature source Oral, resp. rate 20, SpO2 96 %, not currently breastfeeding.  BP (!) 138/58 (BP Location: Right Arm)   Pulse (!) 55   Temp 98.8 F (37.1 C) (Oral)   Resp 12   SpO2 96%   Breastfeeding No Comment: baby is doing Abdomen:  large pannus with drainage blood malodorous fluid in a opening in the center of the incision( 1/2 cm). The right side of the incision was not open. The rest of the incision was well approximated without erythema or induration.  Wound culture was obtained and used to start extend the incision. More drainage was then noted.  The surrounding site was then washed with chloro hex 4 . Consent obtained for I&D of wound.  1% lidocaine( 12 cc)  injected in the skin incision starting from the site that was already opened. The incision was opened at least 4 incision. Sterile q tip used  to confirm integrity of the fascia. The subcutaneous site was  2.5- 3 inches deep. The incision was irrigated with diluted dakon solution and clotted blood /debris removed. Some sutures ( interrupted) removed. No exudate note .good healthy  tissue noted . Irrigated with NS and packed with loosely with kerlex gauze.  Covered with 4 x 4 guaze and abd pads. On the mons is a quarter size ulceration  with smaller boils  not peaked as yet  IMP; Wound seroma infected post cesarean section Postoperative Wound S/p C/S Morbid obesity HTN gestational on procardia P)  I&D of wound as noted above. husband instructed in BID dressing change until referral to [facility] wound center. Continue with bactrim. Wound culture sent Call if temp > 100.4 , pain or increased drainage Keep area dry D/c home ED Course   MDM   Serita Kyle, MD 3:39 PM 10/25/2021

## 2021-10-25 NOTE — MAU Note (Signed)
Joann [name] is a 41 y.o.  here in MAU reporting: c/s 6/14.  Having a lot of blood coming out of her incision and from a cyst (up near clitoris).  Dr Cherly Hensen had told her they were infected and started her on antibiotics on Thu.   Thinks she had a fever on Wed, never did check it.   Onset of complaint: since  catheter/wound vac removed- Pain score: none Vitals:   10/25/21 1231  BP: (!) 138/58  Pulse: (!) 55  Resp: 20  Temp: 98.9 F (37.2 C)  SpO2: 96%      Lab orders placed from triage:  none

## 2021-10-25 NOTE — Progress Notes (Addendum)
Dr. Cherly Hensen in attendance at bedside, assessment performed.  Plan for bedside I& D discussed. Patient verbalized understanding and agreed.  Consent obtained.   1420 Bedside procedure commenced 1530. Bedside procedure completed.   Patient and husband given verbal and physical demonstration about wound care and dressing application.Dr. Cherly Hensen instructed that she would be referred to wound specialist.  Understanding was verbalized and patient agreed.

## 2021-10-30 LAB — AEROBIC/ANAEROBIC CULTURE W GRAM STAIN (SURGICAL/DEEP WOUND)

## 2021-11-01 ENCOUNTER — Inpatient Hospital Stay (HOSPITAL_COMMUNITY)
Admission: AD | Admit: 2021-11-01 | Discharge: 2021-11-01 | Disposition: A | Discharge status: Home / Self Care | Payer: No Typology Code available for payment source | Attending: Obstetrics and Gynecology | Admitting: Obstetrics and Gynecology

## 2021-11-01 ENCOUNTER — Encounter (HOSPITAL_COMMUNITY): Payer: Self-pay | Admitting: Obstetrics and Gynecology

## 2021-11-01 DIAGNOSIS — O9089 Other complications of the puerperium, not elsewhere classified: Secondary | ICD-10-CM | POA: Insufficient documentation

## 2021-11-01 DIAGNOSIS — Z4889 Encounter for other specified surgical aftercare: Secondary | ICD-10-CM | POA: Insufficient documentation

## 2021-11-01 DIAGNOSIS — T8142XA Infection following a procedure, deep incisional surgical site, initial encounter: Secondary | ICD-10-CM

## 2021-11-01 DIAGNOSIS — Y828 Other medical devices associated with adverse incidents: Secondary | ICD-10-CM | POA: Insufficient documentation

## 2021-11-01 LAB — CBC WITH DIFFERENTIAL/PLATELET
Abs Immature Granulocytes: 0.07 10*3/uL (ref 0.00–0.07)
Basophils Absolute: 0 10*3/uL (ref 0.0–0.1)
Basophils Relative: 1 %
Eosinophils Absolute: 0.2 10*3/uL (ref 0.0–0.5)
Eosinophils Relative: 4 %
HCT: 37.1 % (ref 36.0–46.0)
Hemoglobin: 12 g/dL (ref 12.0–15.0)
Immature Granulocytes: 1 %
Lymphocytes Relative: 28 %
Lymphs Abs: 1.7 10*3/uL (ref 0.7–4.0)
MCH: 26.7 pg (ref 26.0–34.0)
MCHC: 32.3 g/dL (ref 30.0–36.0)
MCV: 82.6 fL (ref 80.0–100.0)
Monocytes Absolute: 0.4 10*3/uL (ref 0.1–1.0)
Monocytes Relative: 7 %
Neutro Abs: 3.6 10*3/uL (ref 1.7–7.7)
Neutrophils Relative %: 59 %
Platelets: 363 10*3/uL (ref 150–400)
RBC: 4.49 MIL/uL (ref 3.87–5.11)
RDW: 14.6 % (ref 11.5–15.5)
WBC: 6 10*3/uL (ref 4.0–10.5)
nRBC: 0 % (ref 0.0–0.2)

## 2021-11-01 MED ORDER — DYNAGINATE CA ALGINATE 4"X8" EX MISC
1.0000 | Freq: Every day | CUTANEOUS | Status: DC
Start: 2021-11-01 — End: 2021-11-01
  Filled 2021-11-01: qty 1

## 2021-11-01 NOTE — MAU Note (Signed)
...  Joann [name] is a 41 y.o. at 28w3dpost partum C/S here in MAU reporting: Sent here per Dr. CGarwin Brothersfor evaluation of C/S incision/wound. Patient reports she was evaluated the 24th here in MAU with Dr. CGarwin Brothersand she has not been able to see a wound specialist yet. She reports her first appointment is this upcoming Friday, the 7th.   Pain score: Denies pain.

## 2021-11-01 NOTE — MAU Provider Note (Signed)
History     Chief Complaint  Patient presents with   Wound Check   41 yo G1P1 MBF s/p C/S presents for wound check.  Denies pain or fever. Notes odor with changing of  Dressing and drainage done by pt's husband . Last day of bactrim DS treatment  prescribed based on office wound culture done.  OB History     Gravida  1   Para  1   Term  1   Preterm  0   AB  0   Living  1      SAB  0   IAB  0   Ectopic  0   Multiple  0   Live Births  1           Past Medical History:  Diagnosis Date   Anxiety    Fibroids    Pregnancy induced hypertension     Past Surgical History:  Procedure Laterality Date   CESAREAN SECTION N/A 10/15/2021   Procedure: CESAREAN SECTION;  Surgeon: Servando Salina, MD;  Location: [facility];  Service: Obstetrics;  Laterality: N/A;   HYSTEROSCOPY     MYOMECTOMY      Family History  Problem Relation Age of Onset   Asthma Mother    Hypertension Mother    Diabetes Father    Hypertension Father    Asthma Sister    Cancer Maternal Aunt    Stroke Maternal Aunt    Heart disease Maternal Uncle    Hypertension Maternal Grandmother    Stroke Paternal Grandmother    Diabetes Paternal Grandmother     Social History   Tobacco Use   Smoking status: Never   Smokeless tobacco: Never  Vaping Use   Vaping Use: Never used  Substance Use Topics   Alcohol use: Not Currently    Comment: not while preg   Drug use: No    Allergies: Not on File  Medications Prior to Admission  Medication Sig Dispense Refill Last Dose   albuterol (VENTOLIN HFA) 108 (90 Base) MCG/ACT inhaler Inhale 2 puffs into the lungs every 4 (four) hours as needed for shortness of breath or cough.      Cholecalciferol (VITAMIN D) 50 MCG (2000 UT) CAPS Take 4,000 Units by mouth daily. Gummy      ferrous sulfate 325 (65 FE) MG tablet Take 195 mg by mouth daily with breakfast.      folic acid (FOLVITE) 478 MCG tablet Take 800 mcg by mouth daily.      ibuprofen (ADVIL)  600 MG tablet Take 1 tablet (600 mg total) by mouth every 6 (six) hours as needed. 30 tablet 11    loratadine (CLARITIN) 10 MG tablet Take 10 mg by mouth daily.      Magnesium Oxide 250 MG TABS Take 250 mg by mouth daily.      NIFEdipine (ADALAT CC) 30 MG 24 hr tablet Take 1 tablet (30 mg total) by mouth daily. 30 tablet 11    Omega-3 Fatty Acids (FISH OIL) 1200 MG CPDR Take 1,200 mg by mouth daily.      Prenatal Vit-Fe Fumarate-FA (MULTIVITAMIN-PRENATAL) 27-0.8 MG TABS tablet Take 1 tablet by mouth daily at 12 noon. Dha and Folic Acid        Physical Exam   Blood pressure 120/69, pulse 98, temperature 98.7 F (37.1 C), temperature source Oral, resp. rate 17, height '5\' 5"'$  (1.651 m), weight 131 kg, SpO2 100 %, not currently breastfeeding.  General appearance: alert, cooperative,  no distress, and morbid obesity Abdomen:  soft non distended no surrounding erythema Body odor intertrigo areas Wound: packing removed( kerlex). Slight odor. Good granulation tissue noted.  Some exudate left side deep crevice . Sterile Q tip probe. Still intact Fascia. Wound depth consistent with surgical findings at original C/S. Exudate removed. Irrigated well and lavage with NS . Packed with calcium alginate IMP: postoperative wound infection Review prior wound cultures( office and hospital). The latter done after bactrim had been started. Both also grew GBS which was not covered by bactrim    Latest Ref Rng & Units 11/01/2021    5:37 PM 10/16/2021    5:19 AM 10/13/2021    9:22 AM  CBC  WBC 4.0 - 10.5 K/uL 6.0  13.9  8.0   Hemoglobin 12.0 - 15.0 g/dL 12.0  11.3  11.2   Hematocrit 36.0 - 46.0 % 37.1  34.2  34.9   Platelets 150 - 400 K/uL 363  214  233     P) cbc w/ diff.  Continue daily wound dressing change until appt 7/7 at wound clinic( given supplies).  D/c instructions reviewed F/u 1 wk Stress need to keep surrounding area clean( chlor-hexa 4 given) ED Course   MDM   Marvene Staff, MD 6:04 PM  11/01/2021

## 2021-11-01 NOTE — Progress Notes (Addendum)
Amery Dr. Garwin Brothers in attendance at bedside. Wound cleaning and irrigation performed at bedside by attending.  Patient tolerated it well with no complaint of pain or discomfort.  Patient and husband educated about wound care, and follow up care. Understanding verbalized, patient and husband agreed.

## 2021-11-07 ENCOUNTER — Encounter (HOSPITAL_BASED_OUTPATIENT_CLINIC_OR_DEPARTMENT_OTHER): Payer: No Typology Code available for payment source | Attending: General Surgery | Admitting: General Surgery

## 2021-11-07 DIAGNOSIS — Z6841 Body Mass Index (BMI) 40.0 and over, adult: Secondary | ICD-10-CM | POA: Diagnosis not present

## 2021-11-07 DIAGNOSIS — L98499 Non-pressure chronic ulcer of skin of other sites with unspecified severity: Secondary | ICD-10-CM | POA: Insufficient documentation

## 2021-11-07 DIAGNOSIS — T8131XS Disruption of external operation (surgical) wound, not elsewhere classified, sequela: Secondary | ICD-10-CM | POA: Diagnosis not present

## 2021-11-07 DIAGNOSIS — X58XXXS Exposure to other specified factors, sequela: Secondary | ICD-10-CM | POA: Diagnosis not present

## 2021-11-07 DIAGNOSIS — T8131XA Disruption of external operation (surgical) wound, not elsewhere classified, initial encounter: Secondary | ICD-10-CM | POA: Diagnosis not present

## 2021-11-07 DIAGNOSIS — I10 Essential (primary) hypertension: Secondary | ICD-10-CM | POA: Diagnosis not present

## 2021-11-07 DIAGNOSIS — L98492 Non-pressure chronic ulcer of skin of other sites with fat layer exposed: Secondary | ICD-10-CM | POA: Diagnosis not present

## 2021-11-07 NOTE — Progress Notes (Addendum)
Joann [name], Joann [name] ([id number]) Visit Report for 11/07/2021 Chief Complaint Document Details Patient Name: Date of Service: Joann [name] TO [place] D. 11/07/2021 9:00 A M Medical Record Number: [id number] Patient Account Number: [id number] Date of Birth/Sex: Treating RN: 27-Feb-1981 (41 y.o. Joann [name] Primary Care Provider: PCP, NO Other Clinician: Referring Provider: Treating Provider/Extender: Romilda Garret, Sheronette A Weeks in Treatment: 0 Information Obtained from: Patient Chief Complaint Patient presents to the wound care center with open non-healing surgical wound(s) Electronic Signature(s) Signed: 11/07/2021 9:40:58 AM By: Fredirick Maudlin MD FACS Previous Signature: 11/07/2021 9:08:06 AM Version By: Fredirick Maudlin MD FACS Entered By: Fredirick Maudlin on 11/07/2021 09:40:57 -------------------------------------------------------------------------------- Debridement Details Patient Name: Date of Service: Joann LO MO N, [name] 11/07/2021 9:00 A M Medical Record Number: [id number] Patient Account Number: [id number] Date of Birth/Sex: Treating RN: Mar 30, 1981 (41 y.o. Joann [name] Primary Care Provider: PCP, NO Other Clinician: Referring Provider: Treating Provider/Extender: Romilda Garret, Sheronette A Weeks in Treatment: 0 Debridement Performed for Assessment: Wound #1 Abdomen - Lower Quadrant Performed By: Physician Fredirick Maudlin, MD Debridement Type: Debridement Level of Consciousness (Pre-procedure): Awake and Alert Pre-procedure Verification/Time Out Yes - 09:30 Taken: Start Time: 09:31 Pain Control: Lidocaine 4% T opical Solution T Area Debrided (L x W): otal 2 (cm) x 2 (cm) = 4 (cm) Tissue and other material debrided: Viable, Non-Viable, Fat, Slough, Subcutaneous, Slough Level: Skin/Subcutaneous Tissue Debridement Description: Excisional Instrument: Curette Bleeding: Minimum Hemostasis Achieved: Pressure Procedural Pain: 0 Post  Procedural Pain: 0 Response to Treatment: Procedure was tolerated well Level of Consciousness (Post- Awake and Alert procedure): Post Debridement Measurements of Total Wound Length: (cm) 2.5 Width: (cm) 12.8 Depth: (cm) 5 Volume: (cm) 125.664 Character of Wound/Ulcer Post Debridement: Improved Post Procedure Diagnosis Same as Pre-procedure Electronic Signature(s) Signed: 11/07/2021 9:52:22 AM By: Fredirick Maudlin MD FACS Signed: 11/07/2021 12:13:18 PM By: Baruch Gouty RN, BSN Entered By: Baruch Gouty on 11/07/2021 09:35:29 -------------------------------------------------------------------------------- HPI Details Patient Name: Date of Service: Joann LO MO N, [name] 11/07/2021 9:00 A M Medical Record Number: [id number] Patient Account Number: [id number] Date of Birth/Sex: Treating RN: 08/29/1980 (41 y.o. Joann [name] Primary Care Provider: PCP, NO Other Clinician: Referring Provider: Treating Provider/Extender: Romilda Garret, Sheronette A Weeks in Treatment: 0 History of Present Illness HPI Description: ADMISSION 11/07/2021 This is a 41 year old morbidly obese woman who had a cesarean section on October 15, 2021. Her wound subsequently became infected and had to be reopened. She was prescribed Bactrim by her OB/GYN for Klebsiella and Proteus that were grown from a culture. She completed this course of antibiotics. She saw her OB/GYN again on July 1. The wound was irrigated and debrided then subsequently packed with calcium alginate. She was referred to the wound clinic for further evaluation and management. The center portion of her cesarean section wound is open. It is about 5 cm deep and there is exposed Vicryl suture. No fascial violation is appreciated. On the right, there is a tunnel that extends for about 3 cm laterally. There is some fat necrosis and a little bit of slough present but otherwise it is quite clean. Electronic Signature(s) Signed: 11/07/2021  9:42:07 AM By: Fredirick Maudlin MD FACS Previous Signature: 11/07/2021 9:14:20 AM Version By: Fredirick Maudlin MD FACS Entered By: Fredirick Maudlin on 11/07/2021 09:42:06 -------------------------------------------------------------------------------- Physical Exam Details Patient Name: Date of Service: Joann LO MO N, [name] 11/07/2021 9:00 A M Medical Record Number: [id number] Patient Account  Number: [id number] Date of Birth/Sex: Treating RN: 1980/10/27 (41 y.o. Joann [name] Primary Care Provider: PCP, NO Other Clinician: Referring Provider: Treating Provider/Extender: Romilda Garret, Sheronette A Weeks in Treatment: 0 Constitutional Hypertensive, asymptomatic. Bradycardic, asymptomatic.. . . Morbidly obese. No acute distress. Respiratory Normal work of breathing on room air.. Notes 11/07/2021: The center portion of her cesarean section wound is open. It is about 5 cm deep and there is exposed Vicryl suture. No fascial violation is appreciated. On the right, there is a tunnel that extends for about 3 cm laterally. There is some fat necrosis and a little bit of slough present but otherwise it is quite clean. Electronic Signature(s) Signed: 11/07/2021 9:44:34 AM By: Fredirick Maudlin MD FACS Entered By: Fredirick Maudlin on 11/07/2021 09:44:34 -------------------------------------------------------------------------------- Physician Orders Details Patient Name: Date of Service: Joann LO MO N, [name] 11/07/2021 9:00 A M Medical Record Number: [id number] Patient Account Number: [id number] Date of Birth/Sex: Treating RN: 1980-12-05 (41 y.o. 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 - Dr. Celine Ahr RM 1 with [facility] Return A Monday 7/17 @ 10:15 am Bathing/ Shower/ Hygiene May shower and wash wound with soap and water. Negative Presssure Wound Therapy Wound Vac to wound continuously at 169m/hg pressure [facility]. May utilize formulary equivalent dressing for wound treatment orders unless otherwise specified. - A for NPWT changes Dressing changes to be completed by [facility] Monday / Wednesday / Friday [facility] Wound #1 - Abdomen - Lower Quadrant Prim Dressing: KerraCel Ag Gelling Fiber Dressing, 4x5 in (silver alginate) (DME) (Generic) 1 x Per Day/30 Days ary Discharge Instructions: Apply silver alginate to wound bed to line the woiund bed Prim Dressing: Medline Woven Gauze Sponges 4x4 (in/in) (DME) (Generic) 1 x Per Day/30 Days ary Discharge Instructions: to fill space behind the alginate Secondary Dressing: ABD Pad, 5x9 (DME) (Generic) 1 x Per Day/30 Days Discharge Instructions: Apply over primary dressing as directed. Secured With: 53M Medipore H Soft Cloth Surgical T ape, 4 x 10 (in/yd) (DME) (Generic) 1 x Per Day/30 Days Discharge Instructions: Secure with tape as directed. Wound #2 - Pubis Prim Dressing: KerraCel Ag Gelling Fiber Dressing, 2x2 in (silver alginate) 1 x Per Day/30 Days ary Discharge Instructions: Apply silver alginate to wound bed as instructed Secondary Dressing: Bordered Gauze, 2x3.75 in 1 x Per Day/30 Days Discharge Instructions: Apply over primary dressing as directed. Electronic Signature(s) Signed: 11/07/2021 9:52:22 AM By: CFredirick MaudlinMD FACS Signed: 11/07/2021 12:13:18 PM By:  BBaruch GoutyRN, BSN Previous Signature: 11/07/2021 9:44:41 AM Version By: CFredirick MaudlinMD FACS Entered By: BBaruch Goutyon 11/07/2021 09:47:18 -------------------------------------------------------------------------------- Problem List Details Patient Name: Date of Service: Joann LO MO N, [name] 11/07/2021 9:00 A M Medical Record Number: [id number] Account Number: [id number] of Birth/Sex: Treating RN: 108/05/1980(41y.o. 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 11/07/2021 No Yes T81.31XS Disruption of external operation (surgical) wound, not elsewhere classified, 11/07/2021 No Yes sequela E66.01 Morbid (severe) obesity due to excess calories 11/07/2021 No Yes I10 Essential (primary) hypertension 11/07/2021 No Yes Inactive Problems Resolved Problems Electronic Signature(s) Signed: 11/07/2021 9:35:59 AM By: Fredirick Maudlin MD FACS Previous Signature: 11/07/2021 9:07:49 AM Version By: Fredirick Maudlin MD FACS Entered By: Fredirick Maudlin on 11/07/2021 09:35:58 -------------------------------------------------------------------------------- Progress Note Details Patient Name: Date of Service: Joann LO MO N, [name] 11/07/2021 9:00 A M Medical Record Number: [id number] Patient Account Number: [id number] Date of Birth/Sex: Treating RN: 17-Apr-1981 (41 y.o. Joann [name] Primary Care Provider: PCP, NO Other Clinician: Referring Provider: Treating Provider/Extender: Romilda Garret, Sheronette A Weeks in Treatment: 0 Subjective Chief Complaint Information obtained from Patient Patient presents to the wound care center with open non-healing surgical  wound(s) History of Present Illness (HPI) ADMISSION 11/07/2021 This is a 41 year old morbidly obese woman who had a cesarean section on October 15, 2021. Her wound subsequently became infected and had to be reopened. She was prescribed Bactrim by her OB/GYN for Klebsiella and Proteus that were grown from a culture. She completed this course of antibiotics. She saw her OB/GYN again on July 1. The wound was irrigated and debrided then subsequently packed with calcium alginate. She was referred to the wound clinic for further evaluation and management. The center portion of her cesarean section wound is open. It is about 5 cm deep and there is exposed Vicryl suture. No fascial violation is appreciated. On the right, there is a tunnel that extends for about 3 cm laterally. There is some fat necrosis and a little bit of slough present but otherwise it is quite clean. Patient History Information obtained from Patient, Chart. Allergies No Known Allergies Family History Diabetes - Father,Paternal Grandparents, Heart Disease - Father, Hypertension - Mother,Father,Maternal Grandparents, Lung Disease - [place], Stroke - Paternal Grandparents, No family history of Cancer, Hereditary Spherocytosis, Seizures, Thyroid Problems, Tuberculosis. Social History Never smoker, Marital Status - Married, Alcohol Use - Never, Drug Use - No History, Caffeine Use - Daily. Medical History Eyes Denies history of Cataracts, Glaucoma Ear/Nose/Mouth/Throat Denies history of Chronic sinus problems/congestion, Middle ear problems Respiratory Patient has history of Asthma - during pregnancy Cardiovascular Patient has history of Hypertension Endocrine Denies history of Type I Diabetes, Type II Diabetes Genitourinary Denies history of End Stage Renal Disease Integumentary (Skin) Denies history of History of Burn Oncologic Denies history of Received Chemotherapy, Received Radiation Psychiatric Patient has history  of Confinement Anxiety Denies history of Anorexia/bulimia Hospitalization/Surgery History - myomectomy. - hysteroscopy. - c-section. Medical A Surgical History Notes nd Constitutional Symptoms (General Health) morbid obesity Review of Systems (ROS) Constitutional Symptoms (General Health) Denies complaints or symptoms of Fatigue, Fever, Chills, Marked Weight Change. Eyes Complains or has symptoms of Glasses / Contacts - reading. Denies complaints or symptoms of Dry Eyes, Vision Changes. Ear/Nose/Mouth/Throat Denies complaints or symptoms of Chronic sinus problems or rhinitis. Respiratory Complains or has symptoms of Shortness of Breath - occaissional. Denies complaints or symptoms of Chronic or frequent coughs. Cardiovascular Denies complaints or symptoms of Chest pain. Gastrointestinal Denies complaints or symptoms of Frequent diarrhea, Nausea, Vomiting. Endocrine Denies complaints or symptoms of Heat/cold intolerance. Genitourinary Denies complaints or symptoms of Frequent urination. Integumentary (Skin) Complains or has symptoms of Wounds - lower abdomen. Musculoskeletal Denies complaints or symptoms of Muscle  Pain, Muscle Weakness. Neurologic Denies complaints or symptoms of Numbness/parasthesias. Psychiatric Complains or has symptoms of Claustrophobia. Denies complaints or symptoms of Suicidal. Objective Constitutional Hypertensive, asymptomatic. Bradycardic, asymptomatic.. Morbidly obese. No acute distress. Vitals Time Taken: 9:04 AM, Height: 65 in, Source: Stated, Weight: 285 lbs, Source: Stated, BMI: 47.4, Temperature: 98.4 F, Pulse: 50 bpm, Respiratory Rate: 18 breaths/min, Blood Pressure: 148/90 mmHg. Respiratory Normal work of breathing on room air.. General Notes: 11/07/2021: The center portion of her cesarean section wound is open. It is about 5 cm deep and there is exposed Vicryl suture. No fascial violation is appreciated. On the right, there is a tunnel that  extends for about 3 cm laterally. There is some fat necrosis and a little bit of slough present but otherwise it is quite clean. Integumentary (Hair, Skin) Wound #1 status is Open. Original cause of wound was Surgical Injury. The date acquired was: 10/15/2021. The wound is located on the Abdomen - Lower Quadrant. The wound measures 2.5cm length x 12.8cm width x 5cm depth; 25.133cm^2 area and 125.664cm^3 volume. There is Fat Layer (Subcutaneous Tissue) exposed. There is no undermining noted, however, there is tunneling at 9:00 with a maximum distance of 3cm. There is a large amount of serosanguineous drainage noted. The wound margin is distinct with the outline attached to the wound base. There is large (67-100%) red granulation within the wound bed. There is a small (1-33%) amount of necrotic tissue within the wound bed including Adherent Slough. General Notes: internal suture exposed Wound #2 status is Open. Original cause of wound was Shear/Friction. The date acquired was: 10/15/2021. The wound is located on the Pubis. The wound measures 0.5cm length x 0.1cm width x 0.1cm depth; 0.039cm^2 area and 0.004cm^3 volume. There is Fat Layer (Subcutaneous Tissue) exposed. There is no tunneling or undermining noted. There is a medium amount of serosanguineous drainage noted. The wound margin is flat and intact. There is large (67-100%) red granulation within the wound bed. There is no necrotic tissue within the wound bed. Wound #2 status is Open. Original cause of wound was Shear/Friction. The date acquired was: 10/15/2021. The wound is located on the Pubis. The wound measures 0.5cm length x 0.1cm width x 0.1cm depth; 0.039cm^2 area and 0.004cm^3 volume. Assessment Active Problems ICD-10 Non-pressure chronic ulcer of skin of other sites with unspecified severity Disruption of external operation (surgical) wound, not elsewhere classified, sequela Morbid (severe) obesity due to excess calories Essential  (primary) hypertension Procedures Wound #1 Pre-procedure diagnosis of Wound #1 is an Open Surgical Wound located on the Abdomen - Lower Quadrant . There was a Excisional Skin/Subcutaneous Tissue Debridement with a total area of 4 sq cm performed by Fredirick Maudlin, MD. With the following instrument(s): Curette to remove Viable and Non-Viable tissue/material. Material removed includes Fat, Subcutaneous Tissue, and Slough after achieving pain control using Lidocaine 4% T opical Solution. No specimens were taken. A time out was conducted at 09:30, prior to the start of the procedure. A Minimum amount of bleeding was controlled with Pressure. The procedure was tolerated well with a pain level of 0 throughout and a pain level of 0 following the procedure. Post Debridement Measurements: 2.5cm length x 12.8cm width x 5cm depth; 125.664cm^3 volume. Character of Wound/Ulcer Post Debridement is improved. Post procedure Diagnosis Wound #1: Same as Pre-Procedure Plan Follow-up Appointments: Return Appointment in 1 week. - Dr. Celine Ahr RM 1 with [facility] Monday 7/17 @ 10:15 am Bathing/ Shower/ Hygiene: May shower and wash wound with soap and water.  Negative Presssure Wound Therapy: Wound Vac to wound continuously at 120m/hg pressure Black Foam Home Health: [facility]. May utilize formulary equivalent dressing for wound treatment orders unless otherwise specified. - for NPWT changes Dressing changes to be completed by [facility] Monday / Wednesday / Friday [facility] WOUND #1: - Abdomen - Lower Quadrant Wound Laterality: Prim Dressing: KerraCel Ag Gelling Fiber Dressing, 4x5 in (silver alginate) (DME) (Generic) 1 x Per Day/30 Days ary Discharge Instructions: Apply silver alginate to wound bed to line the woiund bed Prim Dressing: Medline Woven Gauze Sponges 4x4 (in/in) (DME) (Generic) 1 x Per Day/30 Days ary Discharge  Instructions: to fill space behind the alginate Secondary Dressing: ABD Pad, 5x9 (DME) (Generic) 1 x Per Day/30 Days Discharge Instructions: Apply over primary dressing as directed. Secured With: 18M Medipore H Soft Cloth Surgical T ape, 4 x 10 (in/yd) (DME) (Generic) 1 x Per Day/30 Days Discharge Instructions: Secure with tape as directed. WOUND #2: - Pubis Wound Laterality: Prim Dressing: KerraCel Ag Gelling Fiber Dressing, 2x2 in (silver alginate) 1 x Per Day/30 Days ary Discharge Instructions: Apply silver alginate to wound bed as instructed Secondary Dressing: Bordered Gauze, 2x3.75 in 1 x Per Day/30 Days Discharge Instructions: Apply over primary dressing as directed. 11/07/2021: The center portion of her cesarean section wound is open. It is about 5 cm deep and there is exposed Vicryl suture. No fascial violation is appreciated. On the right, there is a tunnel that extends for about 3 cm laterally. There is some fat necrosis and a little bit of slough present but otherwise it is quite clean. I used scissors and forceps to cut and remove the hanging Vicryl suture. I used a curette to debride some fat necrosis and slough from the wound. Ultimately, she is going to benefit from wound VAC application and we will start the insurance approval process for that. In the meantime, she will pack the wound with silver alginate daily. Follow-up here in 1 week. Electronic Signature(s) Signed: 11/07/2021 10:10:55 AM By: CFredirick MaudlinMD FACS Signed: 11/07/2021 12:13:18 PM By: BBaruch GoutyRN, BSN Previous Signature: 11/07/2021 9:45:32 AM Version By: CFredirick MaudlinMD FACS Entered By: BBaruch Goutyon 11/07/2021 10:00:15 -------------------------------------------------------------------------------- HxROS Details Patient Name: Date of Service: Joann LO MO N, [name] 11/07/2021 9:00 A M Medical Record Number: [id number] Account Number: [id number] of Birth/Sex: Treating RN: [birth year] (41y.o. FElam DutchPrimary Care Provider: PCP, NO Other Clinician: Referring Provider: Treating Provider/Extender: CRomilda Garret Sheronette A Weeks in Treatment: 0 Information Obtained From Patient Chart Constitutional Symptoms (General Health) Complaints and Symptoms: Negative for: Fatigue; Fever; Chills; Marked Weight Change Medical History: Past Medical History Notes: morbid obesity Eyes Complaints and Symptoms: Positive for: Glasses / Contacts - reading Negative for: Dry Eyes; Vision Changes Medical History: Negative for: Cataracts; Glaucoma Ear/Nose/Mouth/Throat Complaints and Symptoms: Negative for: Chronic sinus problems or rhinitis Medical History: Negative for: Chronic sinus problems/congestion; Middle ear problems Respiratory Complaints and Symptoms: Positive for: Shortness of Breath - occaissional Negative for: Chronic or frequent coughs Medical History: Positive for: Asthma - during pregnancy Cardiovascular Complaints and Symptoms: Negative for: Chest pain Medical History: Positive for: Hypertension Gastrointestinal Complaints and Symptoms: Negative for: Frequent diarrhea; Nausea; Vomiting Endocrine Complaints and Symptoms: Negative for: Heat/cold intolerance Medical History: Negative for: Type I Diabetes; Type II Diabetes Genitourinary Complaints and Symptoms: Negative for: Frequent urination Medical History: Negative  for: End Stage Renal Disease Integumentary (Skin) Complaints and Symptoms: Positive for: Wounds - lower abdomen Medical History: Negative for: History of Burn Musculoskeletal Complaints and Symptoms: Negative for: Muscle Pain; Muscle Weakness Neurologic Complaints and Symptoms: Negative for: Numbness/parasthesias Psychiatric Complaints and Symptoms: Positive for: Claustrophobia Negative for: Suicidal Medical History: Positive for: Confinement Anxiety Negative for:  Anorexia/bulimia Hematologic/Lymphatic Immunological Oncologic Medical History: Negative for: Received Chemotherapy; Received Radiation Immunizations Pneumococcal Vaccine: Received Pneumococcal Vaccination: No Implantable Devices None Hospitalization / Surgery History Type of Hospitalization/Surgery myomectomy hysteroscopy c-section Family and Social History Cancer: No; Diabetes: Yes - Father,Paternal Grandparents; Heart Disease: Yes - Father; Hereditary Spherocytosis: No; Hypertension: Yes - Mother,Father,Maternal Grandparents; Lung Disease: Yes - Mother,Siblings; Seizures: No; Stroke: Yes - Paternal Grandparents; Thyroid Problems: No; Tuberculosis: No; Never smoker; Marital Status - Married; Alcohol Use: Never; Drug Use: No History; Caffeine Use: Daily; Financial Concerns: No; Food, Clothing or Shelter Needs: No; Support System Lacking: No; Transportation Concerns: No Electronic Signature(s) Signed: 11/07/2021 9:52:22 AM By: Fredirick Maudlin MD FACS Signed: 11/07/2021 12:13:18 PM By: Baruch Gouty RN, BSN Entered By: Baruch Gouty on 11/07/2021 09:13:47 -------------------------------------------------------------------------------- SuperBill Details Patient Name: Date of Service: Joann LO MO N, LA TO [place] D. 11/07/2021 Medical Record Number: [id number] Patient Account Number: [id number] Date of Birth/Sex: Treating RN: [birth year] (41 y.o. 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 T81.31XS Disruption of external operation (surgical) wound, not elsewhere classified, sequela E66.01 Morbid (severe) obesity due to excess calories I10 Essential (primary) hypertension Facility Procedures CPT4 Code: [id number] Description:  [place] VISIT-LEV 3 EST PT Modifier: 25 Quantity: 1 CPT4 Code: [id number] Description: 11042 - DEB SUBQ TISSUE 20 SQ CM/< ICD-10 Diagnosis Description 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 Modifier: Quantity: 1 Physician Procedures : CPT4 Code Description Modifier [id number] - WC PHYS LEVEL 4 - NEW PT 25 ICD-10 Diagnosis Description 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 T81.31XS Disruption of external operation (surgical) wound, not  elsewhere classified, sequela E66.01 Morbid (severe) obesity due to excess calories I10 Essential (primary) hypertension Quantity: 1 : [id number] - WC PHYS SUBQ TISS 20 SQ CM ICD-10 Diagnosis Description 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

## 2021-11-07 NOTE — Progress Notes (Signed)
Joann [name], SWINSON ([id number]) Visit Report for 11/07/2021 Abuse Risk Screen Details Patient Name: Date of Service: [place], [name] 11/07/2021 9:00 A M Medical Record Number: [id number] Patient Account Number: [id number] Date of Birth/Sex: Treating RN: Feb 05, 1981 (41 y.o. [name] Primary Care Larnell Granlund: PCP, NO Other Clinician: Referring Doyce Saling: Treating Nirvana Blanchett/Extender: Romilda Garret, Sheronette A Weeks in Treatment: 0 Abuse Risk Screen Items Answer ABUSE RISK SCREEN: Has anyone close to you tried to hurt or harm you recentlyo No Do you feel uncomfortable with anyone in your familyo No Has anyone forced you do things that you didnt want to doo No Electronic Signature(s) Signed: 11/07/2021 12:13:18 PM By: Baruch Gouty RN, BSN Entered By: Baruch Gouty on 11/07/2021 09:14:00 -------------------------------------------------------------------------------- Activities of Daily Living Details Patient Name: Date of Service: Joann [name], Joann TO [place] D. 11/07/2021 9:00 A M Medical Record Number: [id number] Patient Account Number: [id number] Date of Birth/Sex: Treating RN: [birth year] (41 y.o. [name] Primary Care Savoy [place]: PCP, NO Other Clinician: Referring Luisana Lutzke: Treating Rosene Pilling/Extender: Romilda Garret, Sheronette A Weeks in Treatment: 0 Activities of Daily Living Items Answer Activities of Daily Living (Please select one for each item) Drive Automobile Not Able T Medications ake Completely Able Use T elephone Completely Able Care for Appearance Completely Able Use T oilet Completely Able Bath / Shower Completely Able Dress Self Completely Able Feed Self Completely Able Walk Completely Able Get In / Out Bed Completely Able Housework Completely Able Prepare Meals Completely Twisp for Self Completely Able Electronic Signature(s) Signed: 11/07/2021 12:13:18 PM By: Baruch Gouty RN, BSN Entered  By: Baruch Gouty on 11/07/2021 09:14:38 -------------------------------------------------------------------------------- Education Screening Details Patient Name: Date of Service: Joann [name], Joann [place] D. 11/07/2021 9:00 A M Medical Record Number: [id number] Patient Account Number: [id number] Date of Birth/Sex: Treating RN: 04-Sep-1980 (41 y.o. [name] Primary Care Jamon Hayhurst: PCP, NO Other Clinician: Referring Edwina Grossberg: Treating Demani Mcbrien/Extender: Romilda Garret, Sheronette A Weeks in Treatment: 0 Primary Learner Assessed: Patient Learning Preferences/Education Level/Primary Language Learning Preference: Explanation, Demonstration, Printed Material Highest Education Level: College or Above Preferred Language: English Cognitive Barrier Language Barrier: No Translator Needed: No Memory Deficit: No Emotional Barrier: No Cultural/Religious Beliefs Affecting Medical Care: No Physical Barrier Impaired Vision: No Impaired Hearing: No Decreased Hand dexterity: No Knowledge/Comprehension Knowledge Level: High Comprehension Level: High Ability to understand written instructions: High Ability to understand verbal instructions: High Motivation Anxiety Level: Calm Cooperation: Cooperative Education Importance: Acknowledges Need Interest in Health Problems: Asks Questions Perception: Coherent Willingness to Engage in Self-Management High Activities: Readiness to Engage in Self-Management High Activities: Electronic Signature(s) Signed: 11/07/2021 12:13:18 PM By: Baruch Gouty RN, BSN Entered By: Baruch Gouty on 11/07/2021 09:15:17 -------------------------------------------------------------------------------- Fall Risk Assessment Details Patient Name: Date of Service: Joann [name], Joann TO [place] D. 11/07/2021 9:00 A M Medical Record Number: [id number] Patient Account Number: [id number] Date of Birth/Sex: Treating RN: 07-14-1980 (41 y.o. [name] Primary  Care Abdulkarim Eberlin: PCP, NO Other Clinician: Referring Appollonia Klee: Treating Collene Massimino/Extender: Romilda Garret, Sheronette A Weeks in Treatment: 0 Fall Risk Assessment Items Have you had 2 or more falls in the last 12 monthso 0 No Have you had any fall that resulted in injury in the last 12 monthso 0 No FALLS RISK SCREEN History of falling - immediate or within 3 months 0 No Secondary diagnosis (Do you have 2 or more medical diagnoseso) 0 No Ambulatory aid None/bed rest/wheelchair/nurse  0 Yes Crutches/cane/walker 0 No Furniture 0 No Intravenous therapy Access/Saline/Heparin Lock 0 No Gait/Transferring Normal/ bed rest/ wheelchair 0 Yes Weak (short steps with or without shuffle, stooped but able to lift head while walking, may seek 0 No support from furniture) Impaired (short steps with shuffle, may have difficulty arising from chair, head down, impaired 0 No balance) Mental Status Oriented to own ability 0 Yes Electronic Signature(s) Signed: 11/07/2021 12:13:18 PM By: Baruch Gouty RN, BSN Entered By: Baruch Gouty on 11/07/2021 09:15:28 -------------------------------------------------------------------------------- Foot Assessment Details Patient Name: Date of Service: Joann [name], Joann TO [place] D. 11/07/2021 9:00 A M Medical Record Number: [id number] Patient Account Number: [id number] Date of Birth/Sex: Treating RN: 17-Apr-1981 (41 y.o. [name] Primary Care Consuello Lassalle: PCP, NO Other Clinician: Referring January Bergthold: Treating Shirlee Whitmire/Extender: Romilda Garret, Sheronette A Weeks in Treatment: 0 Foot Assessment Items Site Locations + = Sensation present, - = Sensation absent, C = Callus, U = Ulcer R = Redness, W = Warmth, M = Maceration, PU = Pre-ulcerative lesion F = Fissure, S = Swelling, D = Dryness Assessment Right: Left: Other Deformity: No No Prior Foot Ulcer: No No Prior Amputation: No No Charcot Joint: No No Ambulatory Status: Ambulatory Without  Help Gait: Steady Electronic Signature(s) Signed: 11/07/2021 12:13:18 PM By: Baruch Gouty RN, BSN Entered By: Baruch Gouty on 11/07/2021 09:16:48 -------------------------------------------------------------------------------- Nutrition Risk Screening Details Patient Name: Date of Service: Joann [name], Joann TO [place] D. 11/07/2021 9:00 A M Medical Record Number: [id number] Patient Account Number: [id number] Date of Birth/Sex: Treating RN: [birth year] (41 y.o. [name] Primary Care Dannya Pitkin: PCP, NO Other Clinician: Referring Jolana Runkles: Treating Laniqua Torrens/Extender: Romilda Garret, Sheronette A Weeks in Treatment: 0 Height (in): 65 Weight (lbs): 285 Body Mass Index (BMI): 47.4 Nutrition Risk Screening Items Score Screening NUTRITION RISK SCREEN: I have an illness or condition that made me change the kind and/or amount of food I eat 0 No I eat fewer than two meals per day 0 No I eat few fruits and vegetables, or milk products 0 No I have three or more drinks of beer, liquor or wine almost every day 0 No I have tooth or mouth problems that make it hard for me to eat 0 No I don't always have enough money to buy the food I need 0 No I eat alone most of the time 0 No I take three or more different prescribed or over-the-counter drugs a day 1 Yes Without wanting to, I have lost or gained 10 pounds in the last six months 0 No I am not always physically able to shop, cook and/or feed myself 0 No Nutrition Protocols Good Risk Protocol 0 No interventions needed Moderate Risk Protocol High Risk Proctocol Risk Level: Good Risk Score: 1 Electronic Signature(s) Signed: 11/07/2021 12:13:18 PM By: Baruch Gouty RN, BSN Entered By: Baruch Gouty on 11/07/2021 09:16:39

## 2021-11-10 NOTE — Progress Notes (Signed)
Joann [name], Joann [name] ([id number]) Visit Report for 11/07/2021 Allergy List Details Patient Name: Date of Service: [place], [name] 11/07/2021 9:00 A M Medical Record Number: [id number] Patient Account Number: [id number] Date of Birth/Sex: Treating RN: Feb 03, 1981 (41 y.o. [name] Primary Care Corina Stacy: PCP, NO Other Clinician: Referring Ameah Chanda: Treating Jonathon Castelo/Extender: Romilda Garret, Sheronette A Weeks in Treatment: 0 Allergies Active Allergies No Known Allergies Allergy Notes Electronic Signature(s) Signed: 11/07/2021 12:13:18 PM By: Baruch Gouty RN, BSN Entered By: Baruch Gouty on 11/07/2021 09:05:44 -------------------------------------------------------------------------------- Arrival Information Details Patient Name: Date of Service: [name] 11/07/2021 9:00 A M Medical Record Number: [id number] Patient Account Number: [id number] Date of Birth/Sex: Treating RN: 1980/11/21 (41 y.o. [name] Primary Care Cataleah Stites: PCP, NO Other Clinician: Referring Lavayah Vita: Treating Brycen Bean/Extender: Romilda Garret, Sheronette A Weeks in Treatment: 0 Visit Information Patient Arrived: Ambulatory Arrival Time: 08:54 Accompanied By: self Transfer Assistance: None Patient Identification Verified: Yes Secondary Verification Process Completed: Yes Patient Requires Transmission-Based Precautions: No Patient Has Alerts: No Electronic Signature(s) Signed: 11/07/2021 12:13:18 PM By: Baruch Gouty RN, BSN Entered By: Baruch Gouty on 11/07/2021 09:04:44 -------------------------------------------------------------------------------- Clinic Level of Care Assessment Details Patient Name: Date of Service: [name], [place] TO [place] D. 11/07/2021 9:00 A M Medical Record Number: [id number] Patient Account Number: [id number] Date of Birth/Sex: Treating RN: [birth year] (41 y.o. [name] Primary Care Sadat Sliwa: PCP, NO Other  Clinician: Referring Dhamar Gregory: Treating Cleto Claggett/Extender: Romilda Garret, Sheronette A Weeks in Treatment: 0 Clinic Level of Care Assessment Items TOOL 1 Quantity Score '[]'$  - 0 Use when EandM and Procedure is performed on INITIAL visit ASSESSMENTS - Nursing Assessment / Reassessment X- 1 20 General Physical Exam (combine w/ comprehensive assessment (listed just below) when performed on new pt. evals) X- 1 25 Comprehensive Assessment (HX, ROS, Risk Assessments, Wounds Hx, etc.) ASSESSMENTS - Wound and Skin Assessment / Reassessment '[]'$  - 0 Dermatologic / Skin Assessment (not related to wound area) ASSESSMENTS - Ostomy and/or Continence Assessment and Care '[]'$  - 0 Incontinence Assessment and Management '[]'$  - 0 Ostomy Care Assessment and Management (repouching, etc.) PROCESS - Coordination of Care X - Simple Patient / Family Education for ongoing care 1 15 '[]'$  - 0 Complex (extensive) Patient / Family Education for ongoing care X- 1 10 Staff obtains Programmer, systems, Records, T Results / Process Orders est X- 1 10 Staff telephones HHA, Nursing Homes / Clarify orders / etc '[]'$  - 0 Routine Transfer to another Facility (non-emergent condition) '[]'$  - 0 Routine Hospital Admission (non-emergent condition) X- 1 15 New Admissions / Biomedical engineer / Ordering NPWT Apligraf, etc. , '[]'$  - 0 Emergency Hospital Admission (emergent condition) PROCESS - Special Needs '[]'$  - 0 Pediatric / Minor Patient Management '[]'$  - 0 Isolation Patient Management '[]'$  - 0 Hearing / Language / Visual special needs '[]'$  - 0 Assessment of Community assistance (transportation, D/C planning, etc.) '[]'$  - 0 Additional assistance / Altered mentation '[]'$  - 0 Support Surface(s) Assessment (bed, cushion, seat, etc.) INTERVENTIONS - Miscellaneous '[]'$  - 0 External ear exam '[]'$  - 0 Patient Transfer (multiple staff / Civil Service fast streamer / Similar devices) '[]'$  - 0 Simple Staple / Suture removal (25 or less) '[]'$  - 0 Complex  Staple / Suture removal (26 or more) '[]'$  - 0 Hypo/Hyperglycemic Management (do not check if billed separately) '[]'$  - 0 Ankle / Brachial Index (ABI) - do not check if billed separately Has the patient been seen  at the hospital within the last three years: Yes Total Score: 95 Level Of Care: New/Established - Level 3 Electronic Signature(s) Signed: 11/07/2021 12:13:18 PM By: Baruch Gouty RN, BSN Entered By: Baruch Gouty on 11/07/2021 09:59:25 -------------------------------------------------------------------------------- Encounter Discharge Information Details Patient Name: Date of Service: [name], [name] 11/07/2021 9:00 A M Medical Record Number: [id number] Patient Account Number: [id number] Date of Birth/Sex: Treating RN: 1980/09/13 (41 y.o. [name] Primary Care Gonsalo Cuthbertson: PCP, NO Other Clinician: Referring Lucious Zou: Treating Rylie Limburg/Extender: Romilda Garret, Sheronette A Weeks in Treatment: 0 Encounter Discharge Information Items Post Procedure Vitals Discharge Condition: Stable Temperature (F): 98.4 Ambulatory Status: Ambulatory Pulse (bpm): 50 Discharge Destination: Home Respiratory Rate (breaths/min): 18 Transportation: Private Auto Blood Pressure (mmHg): 148/90 Accompanied By: self Schedule Follow-up Appointment: Yes Clinical Summary of Care: Patient Declined Electronic Signature(s) Signed: 11/07/2021 12:13:18 PM By: Baruch Gouty RN, BSN Entered By: Baruch Gouty on 11/07/2021 10:01:21 -------------------------------------------------------------------------------- Lower Extremity Assessment Details Patient Name: Date of Service: [name], [name] 11/07/2021 9:00 A M Medical Record Number: [id number] Patient Account Number: [id number] Date of Birth/Sex: Treating RN: 06-Aug-1980 (41 y.o. [name] Primary Care Reeanna Acri: PCP, NO Other Clinician: Referring Inez Rosato: Treating Jakarri Lesko/Extender: Romilda Garret,  Sheronette A Weeks in Treatment: 0 Electronic Signature(s) Signed: 11/07/2021 12:13:18 PM By: Baruch Gouty RN, BSN Entered By: Baruch Gouty on 11/07/2021 09:16:53 -------------------------------------------------------------------------------- Multi Wound Chart Details Patient Name: Date of Service: [name], [name] 11/07/2021 9:00 A M Medical Record Number: [id number] Patient Account Number: [id number] Date of Birth/Sex: Treating RN: [birth year] (41 y.o. [name] Primary Care Margo Lama: PCP, NO Other Clinician: Referring Jensen Cheramie: Treating Sabian Kuba/Extender: Romilda Garret, Sheronette A Weeks in Treatment: 0 Vital Signs Height(in): 65 Pulse(bpm): 50 Weight(lbs): 285 Blood Pressure(mmHg): 148/90 Body Mass Index(BMI): 47.4 Temperature(F): 98.4 Respiratory Rate(breaths/min): 18 Photos: [2:No Photos] Abdomen - Lower Quadrant Pubis Pubis Wound Location: Surgical Injury Shear/Friction Shear/Friction Wounding Event: Open Surgical Wound T be determined o T be determined o Primary Etiology: Asthma, Hypertension, Confinement Asthma, Hypertension, Confinement Asthma, Hypertension, Confinement Comorbid History: Anxiety Anxiety Anxiety 10/15/2021 10/15/2021 10/15/2021 Date Acquired: 0 0 0 Weeks of Treatment: Open Open Open Wound Status: No No No Wound Recurrence: 2.5x12.8x5 0.5x0.1x0.1 0.5x0.1x0.1 Measurements L x W x D (cm) 25.133 0.039 0.039 A (cm) : rea 125.664 0.004 0.004 Volume (cm) : 0.00% 0.00% 0.00% % Reduction in A rea: 0.00% 0.00% 0.00% % Reduction in Volume: Full Thickness Without Exposed Full Thickness Without Exposed Partial Thickness Classification: Support Structures Support Structures Large Medium N/A Exudate A mount: Serosanguineous Serosanguineous N/A Exudate Type: red, brown red, brown N/A Exudate Color: Distinct, outline attached Flat and Intact N/A Wound Margin: Large (67-100%) Large (67-100%) N/A Granulation A  mount: Red Red N/A Granulation Quality: Small (1-33%) None Present (0%) N/A Necrotic A mount: Fat Layer (Subcutaneous Tissue): Yes Fat Layer (Subcutaneous Tissue): Yes N/A Exposed Structures: Fascia: No Fascia: No Tendon: No Tendon: No Muscle: No Muscle: No Joint: No Joint: No Bone: No Bone: No Small (1-33%) Small (1-33%) N/A Epithelialization: Debridement - Excisional N/A N/A Debridement: Pre-procedure Verification/Time Out 09:30 N/A N/A Taken: Lidocaine 4% Topical Solution N/A N/A Pain Control: Fat, Subcutaneous, Slough N/A N/A Tissue Debrided: Skin/Subcutaneous Tissue N/A N/A Level: 4 N/A N/A Debridement A (sq cm): rea Curette N/A N/A Instrument: Minimum N/A N/A Bleeding: Pressure N/A N/A Hemostasis A chieved: 0 N/A N/A Procedural Pain: 0 N/A N/A Post Procedural Pain: Procedure was tolerated well  N/A N/A Debridement Treatment Response: 2.5x12.8x5 N/A N/A Post Debridement Measurements L x W x D (cm) 125.664 N/A N/A Post Debridement Volume: (cm) internal suture exposed N/A N/A Assessment Notes: Debridement N/A N/A Procedures Performed: Treatment Notes Electronic Signature(s) Signed: 11/07/2021 9:36:09 AM By: Fredirick Maudlin MD FACS Signed: 11/07/2021 12:13:18 PM By: Baruch Gouty RN, BSN Entered By: Fredirick Maudlin on 11/07/2021 09:36:09 -------------------------------------------------------------------------------- Pain Assessment Details Patient Name: Date of Service: [name], [name] 11/07/2021 9:00 A M Medical Record Number: [id number] Patient Account Number: [id number] Date of Birth/Sex: Treating RN: 1980-09-21 (41 y.o. [name] Primary Care Trixie Maclaren: Other Clinician: PCP, NO Referring Joseph Johns: Treating Nita Whitmire/Extender: Romilda Garret, Sheronette A Weeks in Treatment: 0 Active Problems Location of Pain Severity and Description of Pain Patient Has Paino No Site Locations Rate the pain. Current Pain Level:  0 Pain Management and Medication Current Pain Management: Electronic Signature(s) Signed: 11/07/2021 12:13:18 PM By: Baruch Gouty RN, BSN Entered By: Baruch Gouty on 11/07/2021 09:17:07 -------------------------------------------------------------------------------- Patient/Caregiver Education Details Patient Name: Date of Service: [name], [name] 7/7/2023andnbsp9:00 [employer] Record Number: [id number] Patient Account Number: [id number] Date of Birth/Gender: Treating RN: [birth year] (41 y.o. [name] Primary Care Physician: PCP, NO Other Clinician: Referring Physician: Treating Physician/Extender: Romilda Garret, Sheronette Janett Billow in Treatment: 0 Education Assessment Education Provided To: Patient Education Topics Provided Welcome T [facility]: o Handouts: Welcome T [facility] o Methods: Explain/Verbal, Printed Responses: Reinforcements needed, State content correctly Wound/Skin Impairment: Handouts: Caring for Your Ulcer, Skin Care Do's and Dont's Methods: Explain/Verbal, Printed Responses: Reinforcements needed, State content correctly Electronic Signature(s) Signed: 11/07/2021 12:13:18 PM By: Baruch Gouty RN, BSN Signed: 11/07/2021 12:13:18 PM By: Baruch Gouty RN, BSN Entered By: Baruch Gouty on 11/07/2021 09:55:25 -------------------------------------------------------------------------------- Wound Assessment Details Patient Name: Date of Service: [name], [name] 11/07/2021 9:00 A M Medical Record Number: [id number] Patient Account Number: [id number] Date of Birth/Sex: Treating RN: [birth year] (41 y.o. [name] Primary Care Khriz Liddy: PCP, NO Other Clinician: Referring Roxine Whittinghill: Treating Jahlani Lorentz/Extender: Romilda Garret, Sheronette A Weeks in Treatment: 0 Wound Status Wound Number: 1 Primary Etiology: Open Surgical Wound Wound Location: Abdomen - Lower Quadrant Wound  Status: Open Wounding Event: Surgical Injury Comorbid History: Asthma, Hypertension, Confinement Anxiety Date Acquired: 10/15/2021 Weeks Of Treatment: 0 Clustered Wound: No Photos Wound Measurements Length: (cm) 2.5 Width: (cm) 12.8 Depth: (cm) 5 Area: (cm) 25.133 Volume: (cm) 125.664 % Reduction in Area: 0% % Reduction in Volume: 0% Epithelialization: Small (1-33%) Tunneling: Yes Position (o'clock): 9 Maximum Distance: (cm) 3 Undermining: No Wound Description Classification: Full Thickness Without Exposed Support Structures Wound Margin: Distinct, outline attached Exudate Amount: Large Exudate Type: Serosanguineous Exudate Color: red, brown Foul Odor After Cleansing: No Slough/Fibrino Yes Wound Bed Granulation Amount: Large (67-100%) Exposed Structure Granulation Quality: Red Fascia Exposed: No Necrotic Amount: Small (1-33%) Fat Layer (Subcutaneous Tissue) Exposed: Yes Necrotic Quality: Adherent Slough Tendon Exposed: No Muscle Exposed: No Joint Exposed: No Bone Exposed: No Assessment Notes internal suture exposed Treatment Notes Wound #1 (Abdomen - Lower Quadrant) Cleanser Peri-Wound Care Topical Primary Dressing KerraCel Ag Gelling Fiber Dressing, 4x5 in (silver alginate) Discharge Instruction: Apply silver alginate to wound bed to line the woiund bed Medline Woven Gauze Sponges 4x4 (in/in) Discharge Instruction: to fill space behind the alginate Secondary Dressing ABD Pad, 5x9 Discharge Instruction: Apply over primary dressing as directed. Secured With [employer]  Soft Cloth Surgical T ape, 4 x 10 (in/yd) Discharge Instruction: Secure with tape as directed. Compression Wrap Compression Stockings Add-Ons Electronic Signature(s) Signed: 11/07/2021 12:13:18 PM By: Baruch Gouty RN, BSN Entered By: Baruch Gouty on 11/07/2021 09:59:59 -------------------------------------------------------------------------------- Wound Assessment Details Patient  Name: Date of Service: [name], [name] 11/07/2021 9:00 A M Medical Record Number: [id number] Patient Account Number: [id number] Date of Birth/Sex: Treating RN: 1980/08/03 (41 y.o. [name] Primary Care Tyleigh Mahn: PCP, NO Other Clinician: Referring Kehlani Vancamp: Treating Dylana Shaw/Extender: Romilda Garret, Sheronette A Weeks in Treatment: 0 Wound Status Wound Number: 2 Primary Etiology: T be determined o Wound Location: Pubis Wound Status: Open Wounding Event: Shear/Friction Comorbid History: Asthma, Hypertension, Confinement Anxiety Date Acquired: 10/15/2021 Weeks Of Treatment: 0 Clustered Wound: No Wound Measurements Length: (cm) 0.5 Width: (cm) 0.1 Depth: (cm) 0.1 Area: (cm) 0.039 Volume: (cm) 0.004 % Reduction in Area: 0% % Reduction in Volume: 0% Epithelialization: Small (1-33%) Tunneling: No Undermining: No Wound Description Classification: Full Thickness Without Exposed Support Structures Wound Margin: Flat and Intact Exudate Amount: Medium Exudate Type: Serosanguineous Exudate Color: red, brown Foul Odor After Cleansing: No Slough/Fibrino No Wound Bed Granulation Amount: Large (67-100%) Exposed Structure Granulation Quality: Red Fascia Exposed: No Necrotic Amount: None Present (0%) Fat Layer (Subcutaneous Tissue) Exposed: Yes Tendon Exposed: No Muscle Exposed: No Joint Exposed: No Bone Exposed: No Electronic Signature(s) Signed: 11/07/2021 12:13:18 PM By: Baruch Gouty RN, BSN Entered By: Baruch Gouty on 11/07/2021 09:21:06 -------------------------------------------------------------------------------- Wound Assessment Details Patient Name: Date of Service: [name], [name] 11/07/2021 9:00 A M Medical Record Number: [id number] Patient Account Number: [id number] Date of Birth/Sex: Treating RN: 1981/01/22 (41 y.o. [name] Primary Care Milbert Bixler: PCP, NO Other Clinician: Referring Earland Reish: Treating  Darneisha Windhorst/Extender: Romilda Garret, Sheronette A Weeks in Treatment: 0 Wound Status Wound Number: 2 Primary Etiology: T be determined o Wound Location: Pubis Wound Status: Open Wounding Event: Shear/Friction Comorbid History: Asthma, Hypertension, Confinement Anxiety Date Acquired: 10/15/2021 Weeks Of Treatment: 0 Clustered Wound: No Photos Wound Measurements Length: (cm) 0.5 Width: (cm) 0.1 Depth: (cm) 0.1 Area: (cm) 0.039 Volume: (cm) 0.004 % Reduction in Area: 0% % Reduction in Volume: 0% Wound Description Classification: Partial Thickness Treatment Notes Wound #2 (Pubis) Cleanser Peri-Wound Care Topical Primary Dressing KerraCel Ag Gelling Fiber Dressing, 2x2 in (silver alginate) Discharge Instruction: Apply silver alginate to wound bed as instructed Secondary Dressing Bordered Gauze, 2x3.75 in Discharge Instruction: Apply over primary dressing as directed. Secured With Compression Wrap Compression Stockings Environmental education officer) Signed: 11/07/2021 12:13:18 PM By: Baruch Gouty RN, BSN Signed: 11/10/2021 4:36:07 PM By: Sandre Kitty Entered By: Sandre Kitty on 11/07/2021 09:21:44 -------------------------------------------------------------------------------- Vitals Details Patient Name: Date of Service: [name], [name] 11/07/2021 9:00 A M Medical Record Number: [id number] Patient Account Number: [id number] Date of Birth/Sex: Treating RN: Jun 17, 1980 (41 y.o. [name] Primary Care Karelyn Brisby: PCP, NO Other Clinician: Referring Brittiny Levitz: Treating Addisen Chappelle/Extender: Romilda Garret, Sheronette A Weeks in Treatment: 0 Vital Signs Time Taken: 09:04 Temperature (F): 98.4 Height (in): 65 Pulse (bpm): 50 Source: Stated Respiratory Rate (breaths/min): 18 Weight (lbs): 285 Blood Pressure (mmHg): 148/90 Source: Stated Reference Range: 80 - 120 mg / dl Body Mass Index (BMI): 47.4 Electronic  Signature(s) Signed: 11/07/2021 12:13:18 PM By: Baruch Gouty RN, BSN Entered By: Baruch Gouty on 11/07/2021 09:05:31

## 2021-11-17 ENCOUNTER — Encounter (HOSPITAL_BASED_OUTPATIENT_CLINIC_OR_DEPARTMENT_OTHER): Payer: No Typology Code available for payment source | Admitting: General Surgery

## 2021-11-17 DIAGNOSIS — L98499 Non-pressure chronic ulcer of skin of other sites with unspecified severity: Secondary | ICD-10-CM | POA: Diagnosis not present

## 2021-11-18 NOTE — Progress Notes (Signed)
JONEL, SICK ([id number]) Visit Report for 11/17/2021 Chief Complaint Document Details Patient Name: Date of Service: [place], [name] 11/17/2021 10:15 A M Medical Record Number: [id number] Patient Account Number: [id number] Date of Birth/Sex: Treating RN: Jan 09, 1981 (41 y.o. [name] Primary Care Provider: PCP, NO Other Clinician: Referring Provider: Treating Provider/Extender: Romilda Garret, Sheronette A Weeks in Treatment: 1 Information Obtained from: Patient Chief Complaint Patient presents to the wound care center with open non-healing surgical wound(s) Electronic Signature(s) Signed: 11/17/2021 11:09:41 AM By: Fredirick Maudlin MD FACS Entered By: Fredirick Maudlin on 11/17/2021 11:09:41 -------------------------------------------------------------------------------- Debridement Details Patient Name: Date of Service: [name], [name] 11/17/2021 10:15 A M Medical Record Number: [id number] Patient Account Number: [id number] Date of Birth/Sex: Treating RN: 1980-10-10 (41 y.o. [name] Primary Care Provider: PCP, NO Other Clinician: Referring Provider: Treating Provider/Extender: Romilda Garret, Sheronette A Weeks in Treatment: 1 Debridement Performed for Assessment: Wound #1 Abdomen - Lower Quadrant Performed By: Physician Fredirick Maudlin, MD Debridement Type: Debridement Level of Consciousness (Pre-procedure): Awake and Alert Pre-procedure Verification/Time Out Yes - 11:00 Taken: Start Time: 11:02 Pain Control: Lidocaine 4% T opical Solution T Area Debrided (L x W): otal 1 (cm) x 3 (cm) = 3 (cm) Tissue and other material debrided: Non-Viable, Slough, Slough Level: Non-Viable Tissue Debridement Description: Selective/Open Wound Instrument: Curette Bleeding: Minimum Hemostasis Achieved: Pressure Procedural Pain: 0 Post Procedural Pain: 0 Response to Treatment: Procedure was tolerated well Level of Consciousness  (Post- Awake and Alert procedure): Post Debridement Measurements of Total Wound Length: (cm) 2.5 Width: (cm) 9.5 Depth: (cm) 3.2 Volume: (cm) 59.69 Character of Wound/Ulcer Post Debridement: Improved Post Procedure Diagnosis Same as Pre-procedure Electronic Signature(s) Signed: 11/17/2021 12:31:34 PM By: Fredirick Maudlin MD FACS Signed: 11/18/2021 5:31:58 PM By: Baruch Gouty RN, BSN Entered By: Baruch Gouty on 11/17/2021 11:07:46 -------------------------------------------------------------------------------- HPI Details Patient Name: Date of Service: [name], [name] 11/17/2021 10:15 A M Medical Record Number: [id number] Patient Account Number: [id number] Date of Birth/Sex: Treating RN: November 08, 1980 (41 y.o. [name] Primary Care Provider: PCP, NO Other Clinician: Referring Provider: Treating Provider/Extender: Romilda Garret, Sheronette A Weeks in Treatment: 1 History of Present Illness HPI Description: ADMISSION 11/07/2021 This is a 41 year old morbidly obese woman who had a cesarean section on October 15, 2021. Her wound subsequently became infected and had to be reopened. She was prescribed Bactrim by her OB/GYN for Klebsiella and Proteus that were grown from a culture. She completed this course of antibiotics. She saw her OB/GYN again on July 1. The wound was irrigated and debrided then subsequently packed with calcium alginate. She was referred to the wound clinic for further evaluation and management. The center portion of her cesarean section wound is open. It is about 5 cm deep and there is exposed Vicryl suture. No fascial violation is appreciated. On the right, there is a tunnel that extends for about 3 cm laterally. There is some fat necrosis and a little bit of slough present but otherwise it is quite clean. 11/17/2021: The wound is smaller and shallower today. There is still some tunneling. The drainage is a little bit cloudy. She has been using  calcium alginate to pack the wound. We are working on getting her wound VAC and possibly home health, but this has not yet been approved. Electronic Signature(s) Signed: 11/17/2021 11:10:52 AM By: Fredirick Maudlin MD FACS Entered By: Fredirick Maudlin on 11/17/2021 11:10:52 -------------------------------------------------------------------------------- Physical  Exam Details Patient Name: Date of Service: [name], [name] 11/17/2021 10:15 A M Medical Record Number: [id number] Patient Account Number: [id number] Date of Birth/Sex: Treating RN: [birth year] (41 y.o. [name] Primary Care Provider: PCP, NO Other Clinician: Referring Provider: Treating Provider/Extender: Romilda Garret, Sheronette A Weeks in Treatment: 1 Constitutional . . . . No acute distress.Marland Kitchen Respiratory Normal work of breathing on room air.. Notes 11/17/2021: The wound is smaller and shallower today. There is still some tunneling. The drainage is a little bit cloudy. Electronic Signature(s) Signed: 11/17/2021 11:11:22 AM By: Fredirick Maudlin MD FACS Entered By: Fredirick Maudlin on 11/17/2021 11:11:21 -------------------------------------------------------------------------------- Physician Orders Details Patient Name: Date of Service: [name], [name] 11/17/2021 10:15 A M Medical Record Number: [id number] Patient Account Number: [id number] Date of Birth/Sex: Treating RN: 1980/07/23 (41 y.o. 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 - Dr. Celine Ahr RM 1 with [facility] Return A Monday 7/24 @ 11:15 am Bathing/ Shower/ Hygiene May shower and wash wound with soap and water. Negative Presssure Wound Therapy Wound Vac to wound continuously at 178m/hg pressure - start when available [facility]. May utilize formulary equivalent dressing for wound treatment orders unless otherwise specified. - A for NPWT changes Dressing changes to be completed by [facility] Monday / Wednesday / Friday except [facility] Wound #1 - Abdomen - Lower Quadrant Prim Dressing: KerraCel Ag Gelling Fiber Dressing, 4x5 in (silver alginate) (Generic) 1 x Per Day/30 Days ary Discharge Instructions: Apply silver alginate to wound bed to line the woiund bed Prim Dressing: Medline Woven Gauze Sponges 4x4 (in/in) (Generic) 1 x Per Day/30 Days ary Discharge Instructions: to fill space behind the alginate Secondary Dressing: ABD Pad, 5x9 (Generic) 1 x Per Day/30 Days Discharge Instructions: Apply over primary dressing as directed. Secured With: 78M Medipore H Soft Cloth Surgical T ape, 4 x 10 (in/yd) (Generic) 1 x Per Day/30 Days Discharge Instructions: Secure with tape as directed. Wound #2 - Pubis Prim Dressing: KerraCel Ag Gelling Fiber Dressing, 2x2 in (silver alginate) 1 x Per Day/30 Days ary Discharge Instructions: Apply silver alginate to wound bed as instructed Secondary Dressing: Bordered Gauze, 2x3.75 in 1 x Per Day/30 Days Discharge Instructions: Apply over primary dressing as directed. Electronic Signature(s) Signed: 11/17/2021 12:31:34 PM By: CFredirick MaudlinMD FACS Entered By: CFredirick Maudlinon 11/17/2021 11:11:38 -------------------------------------------------------------------------------- Problem List Details Patient Name: Date of Service: [name], LA TO  YA D. 11/17/2021 10:15 A M Medical Record Number: [id number] Account Number: [id number] of Birth/Sex: Treating RN: 103-17-1982(41y.o. 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 11/17/2021 10:15 A M Medical Record Number: [id number] Patient Account Number: [id number] Date of Birth/Sex: Treating RN: 03/22/1981 (41 y.o. [name] Primary Care Provider: PCP, NO Other Clinician: Referring Provider: Treating Provider/Extender: Romilda Garret, Sheronette A Weeks in Treatment: 1 Subjective Chief Complaint Information obtained from Patient Patient presents to the wound care center with open non-healing surgical wound(s) History of Present Illness (HPI) ADMISSION 11/07/2021 This is a 41 year old morbidly obese woman who had a cesarean section on October 15, 2021. Her wound subsequently became infected and had to be reopened. She was prescribed Bactrim by her OB/GYN for Klebsiella and Proteus that were grown from a culture. She completed this course of antibiotics. She saw her OB/GYN again on July 1.  The wound was irrigated and debrided then subsequently packed with calcium alginate. She was referred to the wound clinic for further evaluation and management. The center portion of her cesarean section wound is open. It is about 5 cm deep and there is exposed Vicryl suture. No fascial violation is appreciated. On the right, there is a tunnel that extends for about 3 cm laterally. There is some fat necrosis and a little bit of slough present but otherwise it is quite clean. 11/17/2021: The wound is smaller and shallower today. There is still some tunneling. The drainage is a little bit cloudy. She has been using calcium alginate to pack the wound. We are working on getting her wound VAC and possibly home health, but this has not yet been approved. Patient History Information obtained from Patient, Chart. Family History Diabetes - Father,Paternal Grandparents, Heart Disease - Father, Hypertension - Mother,Father,Maternal Grandparents, Lung Disease - [facility], Stroke - Paternal Grandparents, No family history of Cancer, Hereditary Spherocytosis, Seizures, Thyroid Problems, Tuberculosis. Social History Never smoker, Marital Status - Married, Alcohol Use - Never, Drug Use - No History, Caffeine Use - Daily. Medical History Eyes Denies history of Cataracts, Glaucoma Ear/Nose/Mouth/Throat Denies history of Chronic sinus problems/congestion, Middle ear problems Respiratory Patient has history of Asthma - during pregnancy Cardiovascular Patient has history of Hypertension Endocrine Denies history of Type I Diabetes, Type II Diabetes Genitourinary Denies history of End Stage Renal Disease Integumentary (Skin) Denies history of History of Burn Oncologic Denies history of Received Chemotherapy, Received Radiation Psychiatric Patient has history of Confinement Anxiety Denies history of Anorexia/bulimia Hospitalization/Surgery History - myomectomy. - hysteroscopy. - c-section. Medical A  Surgical History Notes nd Constitutional Symptoms (General Health) morbid obesity Objective Constitutional No acute distress.. Vitals Time Taken: 10:45 AM, Height: 65 in, Weight: 285 lbs, BMI: 47.4, Temperature: 98.2 F, Pulse: 90 bpm, Respiratory Rate: 18 breaths/min, Blood Pressure: 113/80 mmHg. Respiratory Normal work of breathing on room air.. General Notes: 11/17/2021: The wound is smaller and shallower today. There is still some tunneling. The drainage is a little bit cloudy. Integumentary (Hair, Skin) Wound #1 status is Open. Original cause of wound was Surgical Injury. The date acquired was: 10/15/2021. The wound has been in treatment 1 weeks. The wound is located on the Abdomen - Lower Quadrant. The wound measures 2.5cm length x 9.5cm width x 3.2cm depth; 18.653cm^2 area and 59.69cm^3 volume. There is Fat Layer (Subcutaneous Tissue) exposed. There is no undermining  noted, however, there is tunneling at 9:00 with a maximum distance of 2.7cm. There is additional tunneling and at 3:00 with a maximum distance of 3cm. There is a large amount of purulent drainage noted. The wound margin is distinct with the outline attached to the wound base. There is large (67-100%) red granulation within the wound bed. There is a small (1-33%) amount of necrotic tissue within the wound bed including Adherent Slough. Wound #2 status is Open. Original cause of wound was Shear/Friction. The date acquired was: 10/15/2021. The wound has been in treatment 1 weeks. The wound is located on the Pubis. The wound measures 0.8cm length x 1cm width x 0.1cm depth; 0.628cm^2 area and 0.063cm^3 volume. There is Fat Layer (Subcutaneous Tissue) exposed. There is no tunneling or undermining noted. There is a small amount of purulent drainage noted. The wound margin is flat and intact. There is large (67-100%) red granulation within the wound bed. There is no necrotic tissue within the wound bed. Assessment Active  Problems ICD-10 Non-pressure chronic ulcer of skin of other sites with unspecified severity Disruption of external operation (surgical) wound, not elsewhere classified, sequela Morbid (severe) obesity due to excess calories Essential (primary) hypertension Procedures Wound #1 Pre-procedure diagnosis of Wound #1 is an Open Surgical Wound located on the Abdomen - Lower Quadrant . There was a Selective/Open Wound Non-Viable Tissue Debridement with a total area of 3 sq cm performed by Fredirick Maudlin, MD. With the following instrument(s): Curette to remove Non-Viable tissue/material. Material removed includes [facility] after achieving pain control using Lidocaine 4% Topical Solution. No specimens were taken. A time out was conducted at 11:00, prior to the start of the procedure. A Minimum amount of bleeding was controlled with Pressure. The procedure was tolerated well with a pain level of 0 throughout and a pain level of 0 following the procedure. Post Debridement Measurements: 2.5cm length x 9.5cm width x 3.2cm depth; 59.69cm^3 volume. Character of Wound/Ulcer Post Debridement is improved. Post procedure Diagnosis Wound #1: Same as Pre-Procedure Plan Follow-up Appointments: Return Appointment in 1 week. - Dr. Celine Ahr RM 1 with [facility] Monday 7/24 @ 11:15 am Bathing/ Shower/ Hygiene: May shower and wash wound with soap and water. Negative Presssure Wound Therapy: Wound Vac to wound continuously at 181m/hg pressure - start when available [employer] [facility] Health: [facility] May utilize formulary equivalent dressing for wound treatment orders unless otherwise specified. - for NPWT changes Dressing changes to be completed by [facility] Monday / Wednesday / Friday [facility] #1: - Abdomen - Lower Quadrant Wound Laterality: Prim Dressing: KerraCel Ag Gelling Fiber Dressing, 4x5 in (silver alginate) (Generic) 1 x Per  Day/30 Days ary Discharge Instructions: Apply silver alginate to wound bed to line the woiund bed Prim Dressing: Medline Woven Gauze Sponges 4x4 (in/in) (Generic) 1 x Per Day/30 Days ary Discharge Instructions: to fill space behind the alginate Secondary Dressing: ABD Pad, 5x9 (Generic) 1 x Per Day/30 Days Discharge Instructions: Apply over primary dressing as directed. Secured With: 87M Medipore H Soft Cloth Surgical T ape, 4 x 10 (in/yd) (Generic) 1 x Per Day/30 Days Discharge Instructions: Secure with tape as directed. WOUND #2: - Pubis Wound Laterality: Prim Dressing: KerraCel Ag Gelling Fiber Dressing, 2x2 in (silver alginate) 1 x Per Day/30 Days ary Discharge Instructions: Apply silver alginate to wound bed as instructed Secondary Dressing: Bordered Gauze, 2x3.75 in 1 x Per Day/30 Days Discharge  Instructions: Apply over primary dressing as directed. 11/17/2021: The wound is smaller and shallower today. There is still some tunneling. The drainage is a little bit cloudy. I used a curette to debride some slough and fat necrosis from the wound. I suspect the cloudiness in her drainage is coming from the fat necrosis. She will continue to pack the wound with alginate while we await wound VAC approval. Follow-up in 1 week. Electronic Signature(s) Signed: 11/17/2021 11:12:23 AM By: Fredirick Maudlin MD FACS Entered By: Fredirick Maudlin on 11/17/2021 11:12:22 -------------------------------------------------------------------------------- HxROS Details Patient Name: Date of Service: [name], [name] 11/17/2021 10:15 A M Medical Record Number: [id number] Patient Account Number: [id number] Date of Birth/Sex: Treating RN: October 08, 1980 (41 y.o. [name] Primary Care Provider: Other Clinician: PCP, NO Referring Provider: Treating Provider/Extender: Romilda Garret, Sheronette A Weeks in Treatment: 1 Information Obtained From Patient Chart Constitutional Symptoms  (General Health) Medical History: Past Medical History Notes: morbid obesity Eyes Medical History: Negative for: Cataracts; Glaucoma Ear/Nose/Mouth/Throat Medical History: Negative for: Chronic sinus problems/congestion; Middle ear problems Respiratory Medical History: Positive for: Asthma - during pregnancy Cardiovascular Medical History: Positive for: Hypertension Endocrine Medical History: Negative for: Type I Diabetes; Type II Diabetes Genitourinary Medical History: Negative for: End Stage Renal Disease Integumentary (Skin) Medical History: Negative for: History of Burn Oncologic Medical History: Negative for: Received Chemotherapy; Received Radiation Psychiatric Medical History: Positive for: Confinement Anxiety Negative for: Anorexia/bulimia Immunizations Pneumococcal Vaccine: Received Pneumococcal Vaccination: No Implantable Devices None Hospitalization / Surgery History Type of Hospitalization/Surgery myomectomy hysteroscopy c-section Family and Social History Cancer: No; Diabetes: Yes - Father,Paternal Grandparents; Heart Disease: Yes - Father; Hereditary Spherocytosis: No; Hypertension: Yes - Mother,Father,Maternal Grandparents; Lung Disease: Yes - Mother,Siblings; Seizures: No; Stroke: Yes - Paternal Grandparents; Thyroid Problems: No; Tuberculosis: No; Never smoker; Marital Status - Married; Alcohol Use: Never; Drug Use: No History; Caffeine Use: Daily; Financial Concerns: No; Food, Clothing or Shelter Needs: No; Support System Lacking: No; Transportation Concerns: No Engineer, maintenance) Signed: 11/17/2021 12:31:34 PM By: Fredirick Maudlin MD FACS Signed: 11/18/2021 5:31:58 PM By: Baruch Gouty RN, BSN Entered By: Fredirick Maudlin on 11/17/2021 11:10:59 -------------------------------------------------------------------------------- SuperBill Details Patient Name: Date of Service: [name], [name] 11/17/2021 Medical Record Number:  [id number] Patient Account Number: [id number] Date of Birth/Sex: Treating RN: 1981/01/18 (41 y.o. 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 T81.31XS Disruption of external operation (surgical) wound, not elsewhere classified, sequela E66.01 Morbid (severe) obesity due to excess calories I10 Essential (primary) hypertension Facility Procedures CPT4 Code: [id number] Description: [id number] - DEBRIDE WOUND 1ST 20 SQ CM OR < ICD-10 Diagnosis Description 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 Modifier: Quantity: 1 Physician Procedures : CPT4 Code Description Modifier [id number] - WC PHYS LEVEL 3 - EST PT 25 ICD-10 Diagnosis Description 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

## 2021-11-18 NOTE — Progress Notes (Signed)
AUNE, ADAMI ([id number]) Visit Report for 11/17/2021 Arrival Information Details Patient Name: Date of Service: [place], [name] 11/17/2021 10:15 A M Medical Record Number: [id number] Patient Account Number: [id number] Date of Birth/Sex: Treating RN: 1981/03/24 (41 y.o. Martyn Malay, Linda Primary Care Macyn Shropshire: PCP, NO Other Clinician: Referring Ryder Chesmore: Treating Areeba Sulser/Extender: Romilda Garret, Sheronette A Weeks in Treatment: 1 Visit Information History Since Last Visit Added or deleted any medications: No Patient Arrived: Ambulatory Any new allergies or adverse reactions: No Arrival Time: 10:44 Had a fall or experienced change in No Accompanied By: self activities of daily living that may affect Transfer Assistance: None risk of falls: Patient Identification Verified: Yes Signs or symptoms of abuse/neglect since last visito No Secondary Verification Process Completed: Yes Hospitalized since last visit: No Patient Requires Transmission-Based Precautions: No Implantable device outside of the clinic excluding No Patient Has Alerts: No cellular tissue based products placed in the center since last visit: Has Dressing in Place as Prescribed: Yes Pain Present Now: Yes Electronic Signature(s) Signed: 11/18/2021 5:31:58 PM By: Baruch Gouty RN, BSN Entered By: Baruch Gouty on 11/17/2021 10:45:27 -------------------------------------------------------------------------------- Encounter Discharge Information Details Patient Name: Date of Service: [place], [name] 11/17/2021 10:15 A M Medical Record Number: [id number] Patient Account Number: [id number] Date of Birth/Sex: Treating RN: 06/30/1980 (41 y.o. [name] Primary Care Cathrine Krizan: PCP, NO Other Clinician: Referring Mikolaj Woolstenhulme: Treating Daelyn Pettaway/Extender: Romilda Garret, Sheronette A Weeks in Treatment: 1 Encounter Discharge Information Items Post Procedure Vitals Discharge  Condition: Stable Temperature (F): 98.2 Ambulatory Status: Ambulatory Pulse (bpm): 90 Discharge Destination: Home Respiratory Rate (breaths/min): 18 Transportation: Private Auto Blood Pressure (mmHg): 113/80 Accompanied By: self Schedule Follow-up Appointment: Yes Clinical Summary of Care: Patient Declined Electronic Signature(s) Signed: 11/18/2021 5:31:58 PM By: Baruch Gouty RN, BSN Entered By: Baruch Gouty on 11/18/2021 08:54:08 -------------------------------------------------------------------------------- Lower Extremity Assessment Details Patient Name: Date of Service: [name], [name] 11/17/2021 10:15 A M Medical Record Number: [id number] Patient Account Number: [id number] Date of Birth/Sex: Treating RN: [birth year] (41 y.o. [name] Primary Care Jeanie Mccard: PCP, NO Other Clinician: Referring Deavion Strider: Treating Kolten Ryback/Extender: Romilda Garret, Sheronette A Weeks in Treatment: 1 Electronic Signature(s) Signed: 11/18/2021 5:31:58 PM By: Baruch Gouty RN, BSN Entered By: Baruch Gouty on 11/17/2021 10:46:53 -------------------------------------------------------------------------------- Multi Wound Chart Details Patient Name: Date of Service: Arelia [place] N, [name] 11/17/2021 10:15 A M Medical Record Number: [id number] Patient Account Number: [id number] Date of Birth/Sex: Treating RN: 1981/03/27 (41 y.o. [name] Primary Care Halim Surrette: PCP, NO Other Clinician: Referring Cyrus Ramsburg: Treating Bairon Klemann/Extender: Romilda Garret, Sheronette A Weeks in Treatment: 1 Vital Signs Height(in): 65 Pulse(bpm): 90 Weight(lbs): 285 Blood Pressure(mmHg): 113/80 Body Mass Index(BMI): 47.4 Temperature(F): 98.2 Respiratory Rate(breaths/min): 18 Photos: [N/A:N/A] Abdomen - Lower Quadrant Pubis N/A Wound Location: Surgical Injury Shear/Friction N/A Wounding Event: Open Surgical Wound Abrasion N/A Primary Etiology: Asthma,  Hypertension, Confinement Asthma, Hypertension, Confinement N/A Comorbid History: Anxiety Anxiety 10/15/2021 10/15/2021 N/A Date Acquired: 1 1 N/A Weeks of Treatment: Open Open N/A Wound Status: No No N/A Wound Recurrence: 2.5x9.5x3.2 0.8x1x0.1 N/A Measurements L x W x D (cm) 18.653 0.628 N/A A (cm) : rea 59.69 0.063 N/A Volume (cm) : 25.80% -1510.30% N/A % Reduction in A rea: 52.50% -1475.00% N/A % Reduction in Volume: 9 Position 1 (o'clock): 2.7 Maximum Distance 1 (cm): 3 Position 2 (o'clock): 3 Maximum Distance 2 (cm): Yes No N/A Tunneling: Full Thickness  Without Exposed Full Thickness Without Exposed N/A Classification: Support Structures Support Structures Large Small N/A Exudate Amount: Purulent Purulent N/A Exudate Type: yellow, brown, green yellow, brown, green N/A Exudate Color: Distinct, outline attached Flat and Intact N/A Wound Margin: Large (67-100%) Large (67-100%) N/A Granulation Amount: Red Red N/A Granulation Quality: Small (1-33%) None Present (0%) N/A Necrotic Amount: Fat Layer (Subcutaneous Tissue): Yes Fat Layer (Subcutaneous Tissue): Yes N/A Exposed Structures: Fascia: No Fascia: No Tendon: No Tendon: No Muscle: No Muscle: No Joint: No Joint: No Bone: No Bone: No Small (1-33%) Small (1-33%) N/A Epithelialization: Debridement - Selective/Open Wound N/A N/A Debridement: Pre-procedure Verification/Time Out 11:00 N/A N/A Taken: Lidocaine 4% Topical Solution N/A N/A Pain Control: Slough N/A N/A Tissue Debrided: Non-Viable Tissue N/A N/A Level: 3 N/A N/A Debridement A (sq cm): rea Curette N/A N/A Instrument: Minimum N/A N/A Bleeding: Pressure N/A N/A Hemostasis A chieved: 0 N/A N/A Procedural Pain: 0 N/A N/A Post Procedural Pain: Procedure was tolerated well N/A N/A Debridement Treatment Response: 2.5x9.5x3.2 N/A N/A Post Debridement Measurements L x W x D (cm) 59.69 N/A N/A Post Debridement Volume:  (cm) Debridement N/A N/A Procedures Performed: Treatment Notes Electronic Signature(s) Signed: 11/17/2021 11:09:33 AM By: Fredirick Maudlin MD FACS Signed: 11/18/2021 5:31:58 PM By: Baruch Gouty RN, BSN Entered By: Fredirick Maudlin on 11/17/2021 11:09:33 -------------------------------------------------------------------------------- Multi-Disciplinary Care Plan Details Patient Name: Date of Service: Arelia [place] N, [name] 11/17/2021 10:15 A M Medical Record Number: [id number] Patient Account Number: [id number] Date of Birth/Sex: Treating RN: [birth year] (41 y.o. [name] Primary Care Marsean Elkhatib: PCP, NO Other Clinician: Referring Tyneisha Hegeman: Treating Guled Gahan/Extender: Micheal Likens in Treatment: 1 [place] reviewed with physician Active Inactive Wound/Skin Impairment Nursing Diagnoses: Impaired tissue integrity Knowledge deficit related to ulceration/compromised skin integrity Goals: Patient/caregiver will verbalize understanding of skin care regimen Date Initiated: 11/17/2021 Target Resolution Date: 12/01/2021 Goal Status: Active Ulcer/skin breakdown will have a volume reduction of 30% by week 4 Date Initiated: 11/17/2021 Target Resolution Date: 12/01/2021 Goal Status: Active Interventions: Assess patient/caregiver ability to obtain necessary supplies Assess patient/caregiver ability to perform ulcer/skin care regimen upon admission and as needed Assess ulceration(s) every visit Treatment Activities: Skin care regimen initiated : 11/17/2021 Topical wound management initiated : 11/17/2021 Notes: Electronic Signature(s) Signed: 11/18/2021 5:31:58 PM By: Baruch Gouty RN, BSN Entered By: Baruch Gouty on 11/17/2021 11:06:19 -------------------------------------------------------------------------------- Pain Assessment Details Patient Name: Date of Service: [name], [name] 11/17/2021 10:15 A M Medical  Record Number: [id number] Patient Account Number: [id number] Date of Birth/Sex: Treating RN: 20-Aug-1980 (41 y.o. [name] Primary Care Jarvis Sawa: PCP, NO Other Clinician: Referring Alexandr Yaworski: Treating Sharne Linders/Extender: Romilda Garret, Sheronette A Weeks in Treatment: 1 Active Problems Location of Pain Severity and Description of Pain Patient Has Paino Yes Site Locations Pain Location: Pain in Ulcers With Dressing Change: Yes Duration of the Pain. Constant / Intermittento Intermittent Rate the pain. Current Pain Level: 2 Worst Pain Level: 3 Least Pain Level: 0 Character of Pain Describe the Pain: Other: soreness Pain Management and Medication Current Pain Management: Medication: Yes Is the Current Pain Management Adequate: Adequate How does your wound impact your activities of daily livingo Sleep: No Bathing: No Appetite: No Relationship With Others: No Bladder Continence: No Emotions: No Bowel Continence: No Work: No Toileting: No Drive: No Dressing: No Hobbies: No Electronic Signature(s) Signed: 11/18/2021 5:31:58 PM By: Baruch Gouty RN, BSN Entered By: Baruch Gouty on 11/17/2021 10:46:40 -------------------------------------------------------------------------------- Patient/Caregiver  Education Details Patient Name: Date of Service: [name], [name] 7/17/2023andnbsp10:15 A M Medical Record Number: [id number] Patient Account Number: [id number] Date of Birth/Gender: Treating RN: 1980/08/12 (41 y.o. [name] Primary Care Physician: PCP, NO Other Clinician: Referring Physician: Treating Physician/Extender: Romilda Garret, Sheronette Janett Billow in Treatment: 1 Education Assessment Education Provided To: Patient Education Topics Provided Wound/Skin Impairment: Methods: Explain/Verbal Responses: Reinforcements needed, State content correctly Electronic Signature(s) Signed: 11/18/2021 5:31:58 PM By: Baruch Gouty RN, BSN Entered By: Baruch Gouty on 11/17/2021 11:06:33 -------------------------------------------------------------------------------- Wound Assessment Details Patient Name: Date of Service: [name], [name] 11/17/2021 10:15 A M Medical Record Number: [id number] Patient Account Number: [id number] Date of Birth/Sex: Treating RN: 1981/03/30 (41 y.o. [name] Primary Care Vanice Rappa: PCP, NO Other Clinician: Referring Kody Brandl: Treating Labria Wos/Extender: Romilda Garret, Sheronette A Weeks in Treatment: 1 Wound Status Wound Number: 1 Primary Etiology: Open Surgical Wound Wound Location: Abdomen - Lower Quadrant Wound Status: Open Wounding Event: Surgical Injury Comorbid History: Asthma, Hypertension, Confinement Anxiety Date Acquired: 10/15/2021 Weeks Of Treatment: 1 Clustered Wound: No Photos Wound Measurements Length: (cm) 2.5 Width: (cm) 9.5 Depth: (cm) 3.2 Area: (cm) 18.653 Volume: (cm) 59.69 % Reduction in Area: 25.8% % Reduction in Volume: 52.5% Epithelialization: Small (1-33%) Tunneling: Yes Location 1 Position (o'clock): 9 Maximum Distance: (cm) 2.7 Location 2 Position (o'clock): 3 Maximum Distance: (cm) 3 Undermining: No Wound Description Classification: Full Thickness Without Exposed Support Structures Wound Margin: Distinct, outline attached Exudate Amount: Large Exudate Type: Purulent Exudate Color: yellow, brown, green Foul Odor After Cleansing: No Slough/Fibrino Yes Wound Bed Granulation Amount: Large (67-100%) Exposed Structure Granulation Quality: Red Fascia Exposed: No Necrotic Amount: Small (1-33%) Fat Layer (Subcutaneous Tissue) Exposed: Yes Necrotic Quality: Adherent Slough Tendon Exposed: No Muscle Exposed: No Joint Exposed: No Bone Exposed: No Treatment Notes Wound #1 (Abdomen - Lower Quadrant) Cleanser Peri-Wound Care Topical Primary Dressing KerraCel Ag Gelling Fiber Dressing, 4x5 in (silver  alginate) Discharge Instruction: Apply silver alginate to wound bed to line the woiund bed Medline Woven Gauze Sponges 4x4 (in/in) Discharge Instruction: to fill space behind the alginate Secondary Dressing ABD Pad, 5x9 Discharge Instruction: Apply over primary dressing as directed. Secured With 71M Medipore H Soft Cloth Surgical T ape, 4 x 10 (in/yd) Discharge Instruction: Secure with tape as directed. Compression Wrap Compression Stockings Add-Ons Electronic Signature(s) Signed: 11/18/2021 5:31:58 PM By: Baruch Gouty RN, BSN Entered By: Baruch Gouty on 11/17/2021 10:59:50 -------------------------------------------------------------------------------- Wound Assessment Details Patient Name: Date of Service: [name], [name] 11/17/2021 10:15 A M Medical Record Number: [id number] Patient Account Number: [id number] Date of Birth/Sex: Treating RN: 04/25/1981 (41 y.o. [name] Primary Care Rodell Marrs: PCP, NO Other Clinician: Referring Pink Maye: Treating Tamer Baughman/Extender: Romilda Garret, Sheronette A Weeks in Treatment: 1 Wound Status Wound Number: 2 Primary Etiology: Abrasion Wound Location: Pubis Wound Status: Open Wounding Event: Shear/Friction Comorbid History: Asthma, Hypertension, Confinement Anxiety Date Acquired: 10/15/2021 Weeks Of Treatment: 1 Clustered Wound: No Photos Wound Measurements Length: (cm) 0.8 Width: (cm) 1 Depth: (cm) 0.1 Area: (cm) 0.628 Volume: (cm) 0.063 % Reduction in Area: -1510.3% % Reduction in Volume: -1475% Epithelialization: Small (1-33%) Tunneling: No Undermining: No Wound Description Classification: Full Thickness Without Exposed Support Structures Wound Margin: Flat and Intact Exudate Amount: Small Exudate Type: Purulent Exudate Color: yellow, brown, green Foul Odor After Cleansing: No Slough/Fibrino No Wound Bed Granulation Amount: Large (67-100%) Exposed Structure Granulation Quality:  Red Fascia  Exposed: No Necrotic Amount: None Present (0%) Fat Layer (Subcutaneous Tissue) Exposed: Yes Tendon Exposed: No Muscle Exposed: No Joint Exposed: No Bone Exposed: No Treatment Notes Wound #2 (Pubis) Cleanser Peri-Wound Care Topical Primary Dressing KerraCel Ag Gelling Fiber Dressing, 2x2 in (silver alginate) Discharge Instruction: Apply silver alginate to wound bed as instructed Secondary Dressing Bordered Gauze, 2x3.75 in Discharge Instruction: Apply over primary dressing as directed. Secured With Compression Wrap Compression Stockings Environmental education officer) Signed: 11/18/2021 5:31:58 PM By: Baruch Gouty RN, BSN Signed: 11/18/2021 5:31:58 PM By: Baruch Gouty RN, BSN Entered By: Baruch Gouty on 11/17/2021 11:00:12 -------------------------------------------------------------------------------- Vitals Details Patient Name: Date of Service: [name], [name] 11/17/2021 10:15 A M Medical Record Number: [id number] Patient Account Number: [id number] Date of Birth/Sex: Treating RN: 07-Jul-1980 (41 y.o. [name] Primary Care Aalyssa Elderkin: PCP, NO Other Clinician: Referring Vear Staton: Treating Alexx Giambra/Extender: Romilda Garret, Sheronette A Weeks in Treatment: 1 Vital Signs Time Taken: 10:45 Temperature (F): 98.2 Height (in): 65 Pulse (bpm): 90 Weight (lbs): 285 Respiratory Rate (breaths/min): 18 Body Mass Index (BMI): 47.4 Blood Pressure (mmHg): 113/80 Reference Range: 80 - 120 mg / dl Electronic Signature(s) Signed: 11/18/2021 5:31:58 PM By: Baruch Gouty RN, BSN Entered By: Baruch Gouty on 11/17/2021 10:45:55

## 2021-11-21 DIAGNOSIS — T8131XA Disruption of external operation (surgical) wound, not elsewhere classified, initial encounter: Secondary | ICD-10-CM | POA: Diagnosis not present

## 2021-11-24 ENCOUNTER — Encounter (HOSPITAL_BASED_OUTPATIENT_CLINIC_OR_DEPARTMENT_OTHER): Payer: No Typology Code available for payment source | Admitting: General Surgery

## 2021-11-24 DIAGNOSIS — L98499 Non-pressure chronic ulcer of skin of other sites with unspecified severity: Secondary | ICD-10-CM | POA: Diagnosis not present

## 2021-11-24 DIAGNOSIS — L98492 Non-pressure chronic ulcer of skin of other sites with fat layer exposed: Secondary | ICD-10-CM | POA: Diagnosis not present

## 2021-11-24 NOTE — Progress Notes (Signed)
HOKULANI, ROGEL ([id number]) Visit Report for 11/24/2021 Arrival Information Details Patient Name: Date of Service: [place], [name] 11/24/2021 11:15 A M Medical Record Number: [id number] Patient Account Number: [id number] Date of Birth/Sex: Treating RN: 10-May-1980 (41 y.o. [name] Primary Care Maximino Cozzolino: PCP, NO Other Clinician: Referring Gerda Yin: Treating Dervin Vore/Extender: Romilda Garret, Sheronette A Weeks in Treatment: 2 Visit Information History Since Last Visit All ordered tests and consults were completed: Yes Patient Arrived: Ambulatory Added or deleted any medications: No Arrival Time: 11:26 Any new allergies or adverse reactions: No Accompanied By: spouse Had a fall or experienced change in No Transfer Assistance: None activities of daily living that may affect Patient Identification Verified: Yes risk of falls: Secondary Verification Process Completed: Yes Signs or symptoms of abuse/neglect since last visito No Patient Requires Transmission-Based Precautions: No Hospitalized since last visit: No Patient Has Alerts: No Implantable device outside of the clinic excluding No cellular tissue based products placed in the center since last visit: Has Dressing in Place as Prescribed: Yes Has Compression in Place as Prescribed: No Pain Present Now: No Electronic Signature(s) Signed: 11/24/2021 5:10:34 PM By: Blanche East RN Entered By: Blanche East on 11/24/2021 11:27:08 -------------------------------------------------------------------------------- Encounter Discharge Information Details Patient Name: Date of Service: [place], [name] 11/24/2021 11:15 A M Medical Record Number: [id number] Patient Account Number: [id number] Date of Birth/Sex: Treating RN: 19-Feb-1981 (41 y.o. [name] Primary Care Kameo Bains: PCP, NO Other Clinician: Referring Rotunda Worden: Treating Mattison Golay/Extender: Romilda Garret, Sheronette A Weeks  in Treatment: 2 Encounter Discharge Information Items Discharge Condition: Stable Ambulatory Status: Ambulatory Discharge Destination: Home Transportation: Private Auto Accompanied By: spouse Schedule Follow-up Appointment: Yes Clinical Summary of Care: Electronic Signature(s) Signed: 11/24/2021 5:10:34 PM By: Blanche East RN Entered By: Blanche East on 11/24/2021 12:23:20 -------------------------------------------------------------------------------- Lower Extremity Assessment Details Patient Name: Date of Service: [name], [place] D. 11/24/2021 11:15 A M Medical Record Number: [id number] Patient Account Number: [id number] Date of Birth/Sex: Treating RN: 1980-10-20 (41 y.o. [name] Primary Care Adilenne Ashworth: PCP, NO Other Clinician: Referring Aleesa Sweigert: Treating Jameriah Trotti/Extender: Romilda Garret, Sheronette A Weeks in Treatment: 2 Electronic Signature(s) Signed: 11/24/2021 5:10:34 PM By: Blanche East RN Signed: 11/24/2021 5:44:10 PM By: Baruch Gouty RN, BSN Entered By: Blanche East on 11/24/2021 11:28:06 -------------------------------------------------------------------------------- Multi Wound Chart Details Patient Name: Date of Service: Arelia Long[place] N, [name] 11/24/2021 11:15 A M Medical Record Number: [id number] Patient Account Number: [id number] Date of Birth/Sex: Treating RN: July 11, 1980 (41 y.o. [name] Primary Care Addylynn Balin: PCP, NO Other Clinician: Referring Caterin Tabares: Treating Zareth Rippetoe/Extender: Romilda Garret, Sheronette A Weeks in Treatment: 2 Vital Signs Height(in): 65 Pulse(bpm): 90 Weight(lbs): 285 Blood Pressure(mmHg): 125/82 Body Mass Index(BMI): 47.4 Temperature(F): 98.5 Respiratory Rate(breaths/min): 18 Photos: [N/A:N/A] Abdomen - Lower Quadrant Pubis N/A Wound Location: Surgical Injury Shear/Friction N/A Wounding Event: Open Surgical Wound Abrasion N/A Primary Etiology: Asthma, Hypertension,  Confinement Asthma, Hypertension, Confinement N/A Comorbid History: Anxiety Anxiety 10/15/2021 10/15/2021 N/A Date Acquired: 2 2 N/A Weeks of Treatment: Open Open N/A Wound Status: No No N/A Wound Recurrence: 2x8.3x2.4 0.6x0.6x0.1 N/A Measurements L x W x D (cm) 13.038 0.283 N/A A (cm) : rea 31.29 0.028 N/A Volume (cm) : 48.10% -625.60% N/A % Reduction in A rea: 75.10% -600.00% N/A % Reduction in Volume: 3 Position 1 (o'clock): 2.8 Maximum Distance 1 (cm): 9 Position 2 (o'clock): 3.2 Maximum Distance 2 (cm): Yes No N/A Tunneling:  Full Thickness Without Exposed Full Thickness Without Exposed N/A Classification: Support Structures Support Structures Copy N/A Exudate Amount: Serosanguineous Serosanguineous N/A Exudate Type: red, brown red, brown N/A Exudate Color: Distinct, outline attached Flat and Intact N/A Wound Margin: Large (67-100%) Large (67-100%) N/A Granulation Amount: Red Red N/A Granulation Quality: Small (1-33%) None Present (0%) N/A Necrotic Amount: Fat Layer (Subcutaneous Tissue): Yes Fat Layer (Subcutaneous Tissue): Yes N/A Exposed Structures: Fascia: No Fascia: No Tendon: No Tendon: No Muscle: No Muscle: No Joint: No Joint: No Bone: No Bone: No Small (1-33%) Large (67-100%) N/A Epithelialization: Negative Pressure Wound Therapy N/A N/A Procedures Performed: Application (NPWT) Treatment Notes Electronic Signature(s) Signed: 11/24/2021 11:59:08 AM By: Fredirick Maudlin MD FACS Signed: 11/24/2021 5:44:10 PM By: Baruch Gouty RN, BSN Entered By: Fredirick Maudlin on 11/24/2021 11:59:08 -------------------------------------------------------------------------------- Multi-Disciplinary Care Plan Details Patient Name: Date of Service: Arelia [place], [name] 11/24/2021 11:15 A M Medical Record Number: [id number] Patient Account Number: [id number] Date of Birth/Sex: Treating RN: [birth year] (41 y.o. [name] Primary Care  Cheris Tweten: PCP, NO Other Clinician: Referring Henley Blyth: Treating Ramere Downs/Extender: Micheal Likens in Treatment: 2 Multidisciplinary Care Plan reviewed with physician Active Inactive Wound/Skin Impairment Nursing Diagnoses: Impaired tissue integrity Knowledge deficit related to ulceration/compromised skin integrity Goals: Patient/caregiver will verbalize understanding of skin care regimen Date Initiated: 11/17/2021 Target Resolution Date: 12/01/2021 Goal Status: Active Ulcer/skin breakdown will have a volume reduction of 30% by week 4 Date Initiated: 11/17/2021 Target Resolution Date: 12/01/2021 Goal Status: Active Interventions: Assess patient/caregiver ability to obtain necessary supplies Assess patient/caregiver ability to perform ulcer/skin care regimen upon admission and as needed Assess ulceration(s) every visit Treatment Activities: Skin care regimen initiated : 11/17/2021 Topical wound management initiated : 11/17/2021 Notes: Electronic Signature(s) Signed: 11/24/2021 5:10:34 PM By: Blanche East RN Signed: 11/24/2021 5:44:10 PM By: Baruch Gouty RN, BSN Entered By: Blanche East on 11/24/2021 11:39:35 -------------------------------------------------------------------------------- Negative Pressure Wound Therapy Application (NPWT) Details Patient Name: Date of Service: [place], [place] D. 11/24/2021 11:15 A M Medical Record Number: [id number] Patient Account Number: [id number] Date of Birth/Sex: Treating RN: 07/20/1980 (41 y.o. [name] Primary Care Lizzy Hamre: PCP, NO Other Clinician: Referring Sumire Halbleib: Treating Evadene Wardrip/Extender: Romilda Garret, Sheronette A Weeks in Treatment: 2 NPWT Application Performed for: Wound #1 Abdomen - Lower Quadrant Additional Injuries Covered: No Performed By: Baruch Gouty, RN Type: VAC System Coverage Size (sq cm): 16.6 Pressure Type: Constant Pressure Setting: 125 mmHG Drain  Type: None Quantity of Sponges/Gauze Inserted: 1 Sponge/Dressing Type: Foam, Black Date Initiated: 11/24/2021 Response to Treatment: good Post Procedure Diagnosis Same as Pre-procedure Electronic Signature(s) Signed: 11/24/2021 5:10:34 PM By: Blanche East RN Entered By: Blanche East on 11/24/2021 11:52:13 -------------------------------------------------------------------------------- Pain Assessment Details Patient Name: Date of Service: [place], [place]. 11/24/2021 11:15 A M Medical Record Number: [id number] Patient Account Number: [id number] Date of Birth/Sex: Treating RN: [birth year] (41 y.o. [name] Primary Care Vesna Kable: PCP, NO Other Clinician: Referring Chelsey Redondo: Treating Yordin Rhoda/Extender: Romilda Garret, Sheronette A Weeks in Treatment: 2 Active Problems Location of Pain Severity and Description of Pain Patient Has Paino No Site Locations With Dressing Change: No Rate the pain. Current Pain Level: 0 Pain Management and Medication Current Pain Management: Electronic Signature(s) Signed: 11/24/2021 5:10:34 PM By: Blanche East RN Signed: 11/24/2021 5:44:10 PM By: Baruch Gouty RN, BSN Entered By: Blanche East on 11/24/2021 11:28:00 -------------------------------------------------------------------------------- Patient/Caregiver Education Details Patient Name: Date of Service: [place], [place] D. 7/24/2023andnbsp11:15 A M Medical Record Number: [id number] Patient Account Number: [id number] Date of Birth/Gender: Treating RN: 21-Nov-1980 (41 y.o. [name] Primary Care Physician: PCP, NO Other Clinician: Referring Physician: Treating Physician/Extender: Micheal Likens in Treatment: 2 Education Assessment Education Provided To: Patient Education Topics Provided Wound/Skin Impairment: Methods: Explain/Verbal Responses: Reinforcements needed, State content correctly Electronic  Signature(s) Signed: 11/24/2021 5:10:34 PM By: Blanche East RN Entered By: Blanche East on 11/24/2021 11:40:10 -------------------------------------------------------------------------------- Wound Assessment Details Patient Name: Date of Service: [name] 11/24/2021 11:15 A M Medical Record Number: [id number] Patient Account Number: [id number] Date of Birth/Sex: Treating RN: Mar 06, 1981 (41 y.o. [name] Primary Care Seanne Chirico: PCP, NO Other Clinician: Referring William Schake: Treating Kenzly Rogoff/Extender: Romilda Garret, Sheronette A Weeks in Treatment: 2 Wound Status Wound Number: 1 Primary Etiology: Open Surgical Wound Wound Location: Abdomen - Lower Quadrant Wound Status: Open Wounding Event: Surgical Injury Comorbid History: Asthma, Hypertension, Confinement Anxiety Date Acquired: 10/15/2021 Weeks Of Treatment: 2 Clustered Wound: No Photos Wound Measurements Length: (cm) 2 Width: (cm) 8.3 Depth: (cm) 2.4 Area: (cm) 13.038 Volume: (cm) 31.29 % Reduction in Area: 48.1% % Reduction in Volume: 75.1% Epithelialization: Small (1-33%) Tunneling: Yes Location 1 Position (o'clock): 3 Maximum Distance: (cm) 2.8 Location 2 Position (o'clock): 9 Maximum Distance: (cm) 3.2 Undermining: No Wound Description Classification: Full Thickness Without Exposed Support Structures Wound Margin: Distinct, outline attached Exudate Amount: Large Exudate Type: Serosanguineous Exudate Color: red, brown Foul Odor After Cleansing: No Slough/Fibrino Yes Wound Bed Granulation Amount: Large (67-100%) Exposed Structure Granulation Quality: Red Fascia Exposed: No Necrotic Amount: Small (1-33%) Fat Layer (Subcutaneous Tissue) Exposed: Yes Necrotic Quality: Adherent Slough Tendon Exposed: No Muscle Exposed: No Joint Exposed: No Bone Exposed: No Treatment Notes Wound #1 (Abdomen - Lower Quadrant) Cleanser Peri-Wound Care Topical Primary  Dressing vac Secondary Dressing Secured With Compression Wrap Compression Stockings Add-Ons Electronic Signature(s) Signed: 11/24/2021 5:10:34 PM By: Blanche East RN Signed: 11/24/2021 5:44:10 PM By: Baruch Gouty RN, BSN Entered By: Blanche East on 11/24/2021 11:37:38 -------------------------------------------------------------------------------- Wound Assessment Details Patient Name: Date of Service: [name], [name] 11/24/2021 11:15 A M Medical Record Number: [id number] Patient Account Number: [id number] Date of Birth/Sex: Treating RN: Oct 01, 1980 (41 y.o. [name] Primary Care Karema Tocci: PCP, NO Other Clinician: Referring Kendrick Haapala: Treating Adelynne Joerger/Extender: Romilda Garret, Sheronette A Weeks in Treatment: 2 Wound Status Wound Number: 2 Primary Etiology: Abrasion Wound Location: Pubis Wound Status: Open Wounding Event: Shear/Friction Comorbid History: Asthma, Hypertension, Confinement Anxiety Date Acquired: 10/15/2021 Weeks Of Treatment: 2 Clustered Wound: No Photos Wound Measurements Length: (cm) 0.6 Width: (cm) 0.6 Depth: (cm) 0.1 Area: (cm) 0.283 Volume: (cm) 0.028 % Reduction in Area: -625.6% % Reduction in Volume: -600% Epithelialization: Large (67-100%) Tunneling: No Undermining: No Wound Description Classification: Full Thickness Without Exposed Support Structures Wound Margin: Flat and Intact Exudate Amount: Small Exudate Type: Serosanguineous Exudate Color: red, brown Foul Odor After Cleansing: No Slough/Fibrino No Wound Bed Granulation Amount: Large (67-100%) Exposed Structure Granulation Quality: Red Fascia Exposed: No Necrotic Amount: None Present (0%) Fat Layer (Subcutaneous Tissue) Exposed: Yes Tendon Exposed: No Muscle Exposed: No Joint Exposed: No Bone Exposed: No Treatment Notes Wound #2 (Pubis) Cleanser Peri-Wound Care Topical Primary Dressing KerraCel Ag Gelling Fiber Dressing, 2x2 in (silver  alginate) Discharge Instruction: Apply silver alginate to wound bed as instructed Secondary Dressing Bordered Gauze, 2x3.75 in Discharge Instruction: Apply over primary dressing as  directed. Secured With Compression Wrap Compression Stockings Environmental education officer) Signed: 11/24/2021 5:10:34 PM By: Blanche East RN Signed: 11/24/2021 5:44:10 PM By: Baruch Gouty RN, BSN Entered By: Blanche East on 11/24/2021 11:38:31 -------------------------------------------------------------------------------- Vitals Details Patient Name: Date of Service: [name], [name] 11/24/2021 11:15 A M Medical Record Number: [id number] Patient Account Number: [id number] Date of Birth/Sex: Treating RN: 14-Jul-1980 (41 y.o. [name] Primary Care Baldo Hufnagle: PCP, NO Other Clinician: Referring Arden Axon: Treating Nayleah Gamel/Extender: Romilda Garret, Sheronette A Weeks in Treatment: 2 Vital Signs Time Taken: 11:27 Temperature (F): 98.5 Height (in): 65 Pulse (bpm): 90 Weight (lbs): 285 Respiratory Rate (breaths/min): 18 Body Mass Index (BMI): 47.4 Blood Pressure (mmHg): 125/82 Reference Range: 80 - 120 mg / dl Electronic Signature(s) Signed: 11/24/2021 5:10:34 PM By: Blanche East RN Entered By: Blanche East on 11/24/2021 11:27:37

## 2021-11-24 NOTE — Progress Notes (Signed)
Joann [name], Joann [name] ([id number]) Visit Report for 11/24/2021 Chief Complaint Document Details Patient Name: Date of Service: [place], [place] TO [place] [name]. 11/24/2021 11:15 A M Medical Record Number: [id number] Patient Account Number: [id number] Date of Birth/Sex: Treating RN: April 14, 1981 (41 y.o. [name] Primary Care Provider: PCP, NO Other Clinician: Referring Provider: Treating Provider/Extender: Romilda Garret, Sheronette A Weeks in Treatment: 2 Information Obtained from: Patient Chief Complaint Patient presents to the wound care center with open non-healing surgical wound(s) Electronic Signature(s) Signed: 11/24/2021 11:59:14 AM By: Fredirick Maudlin MD FACS Entered By: Fredirick Maudlin on 11/24/2021 11:59:14 -------------------------------------------------------------------------------- HPI Details Patient Name: Date of Service: Joann [name], Joann [name]. 11/24/2021 11:15 A M Medical Record Number: [id number] Patient Account Number: [id number] Date of Birth/Sex: Treating RN: Jun 05, 1980 (41 y.o. [name] Primary Care Provider: PCP, NO Other Clinician: Referring Provider: Treating Provider/Extender: Romilda Garret, Sheronette A Weeks in Treatment: 2 History of Present Illness HPI Description: ADMISSION 11/07/2021 This is a 41 year old morbidly obese woman who had a cesarean section on October 15, 2021. Her wound subsequently became infected and had to be reopened. She was prescribed Bactrim by her OB/GYN for Klebsiella and Proteus that were grown from a culture. She completed this course of antibiotics. She saw her OB/GYN again on July 1. The wound was irrigated and debrided then subsequently packed with calcium alginate. She was referred to the wound clinic for further evaluation and management. The center portion of her cesarean section wound is open. It is about 5 cm deep and there is exposed Vicryl suture. No fascial violation is appreciated. On  the right, there is a tunnel that extends for about 3 cm laterally. There is some fat necrosis and a little bit of slough present but otherwise it is quite clean. 11/17/2021: The wound is smaller and shallower today. There is still some tunneling. The drainage is a little bit cloudy. She has been using calcium alginate to pack the wound. We are working on getting her wound VAC and possibly home health, but this has not yet been approved. 11/24/2021: The wound continues to contract. Is narrower and shallower. There is still some lateral tunneling. She does have her wound VAC with her today. Electronic Signature(s) Signed: 11/24/2021 11:59:48 AM By: Fredirick Maudlin MD FACS Entered By: Fredirick Maudlin on 11/24/2021 11:59:47 -------------------------------------------------------------------------------- Physical Exam Details Patient Name: Date of Service: Joann [name], Joann [name]. 11/24/2021 11:15 A M Medical Record Number: [id number] Patient Account Number: [id number] Date of Birth/Sex: Treating RN: 1981/01/08 (41 y.o. [name] Primary Care Provider: PCP, NO Other Clinician: Referring Provider: Treating Provider/Extender: Romilda Garret, Sheronette A Weeks in Treatment: 2 Constitutional . . . . No acute distress.Marland Kitchen Respiratory Normal work of breathing on room air.. Notes 11/24/2021: The wound continues to contract. Is narrower and shallower. There is still some lateral tunneling. Electronic Signature(s) Signed: 11/24/2021 12:00:14 PM By: Fredirick Maudlin MD FACS Entered By: Fredirick Maudlin on 11/24/2021 12:00:14 -------------------------------------------------------------------------------- Physician Orders Details Patient Name: Date of Service: Joann [name], Joann [name]. 11/24/2021 11:15 A M Medical Record Number: [id number] Patient Account Number: [id number] Date of Birth/Sex: Treating RN: [birth year] (41 y.o. 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 - Dr. Celine Ahr RM 1 with Vaughan Basta Return A Mon, 7/31 '@2'$ :00pm Bathing/ Shower/ Hygiene May shower and wash wound with soap and water. Negative Presssure Wound Therapy Wound Vac to wound continuously at 187m/hg pressure - initiated today, 7/24 Black Foam Wound Treatment Wound #1 - Abdomen - Lower Quadrant Prim Dressing: vac ary 2 x Per Week/30 Days Wound #2 - Pubis Prim Dressing: KerraCel Ag Gelling Fiber Dressing, 2x2 in (silver alginate) 2 x Per Week/30 Days ary Discharge Instructions: Apply silver alginate to wound bed as instructed Secondary Dressing: Bordered Gauze, 2x3.75 in 2 x Per Week/30 Days Discharge Instructions: Apply over primary dressing as directed. Electronic Signature(s) Signed: 11/24/2021 3:41:11 PM By: CFredirick MaudlinMD FACS Entered By: CFredirick Maudlinon 11/24/2021 12:00:24 -------------------------------------------------------------------------------- Problem List Details Patient Name: Date of Service: Joann [name], Joann [name]. 11/24/2021 11:15 A M Medical Record Number: [id number] Account Number: [id number] of Birth/Sex: Treating RN: [birth year](41y.o. FElam DutchPrimary Care Provider: PCP, NO Other Clinician: Referring Provider: Treating Provider/Extender: CRomilda Garret Sheronette A Weeks in Treatment: 2 Active Problems ICD-10 Encounter Code Description Active Date MDM Diagnosis L98.499 Non-pressure chronic ulcer  of skin of other sites with unspecified severity 11/07/2021 No Yes T81.31XS Disruption of external operation (surgical) wound, not elsewhere classified, 11/07/2021 No Yes sequela E66.01 Morbid (severe) obesity due to excess calories 11/07/2021 No Yes I10 Essential (primary) hypertension 11/07/2021 No Yes Inactive Problems Resolved Problems Electronic Signature(s) Signed: 11/24/2021 11:58:57 AM By: CFredirick MaudlinMD FACS Entered By: CFredirick Maudlinon 11/24/2021 11:58:57 -------------------------------------------------------------------------------- Progress Note Details Patient Name: Date of Service: Joann [name], Joann [name]. 11/24/2021 11:15 A M Medical Record Number: [id number] Account Number: [id number] of Birth/Sex: Treating RN: [birth year](41y.o. FElam DutchPrimary Care Provider: PCP, NO Other Clinician: Referring Provider: Treating Provider/Extender: CRomilda Garret Sheronette A Weeks in Treatment: 2 Subjective Chief Complaint Information obtained from Patient Patient presents to the wound care center with open non-healing surgical wound(s) History of Present Illness (HPI) ADMISSION 11/07/2021 This is a 41year old morbidly obese woman who had a cesarean section on October 15, 2021. Her wound subsequently became infected and had to be reopened. She was prescribed Bactrim by her OB/GYN for Klebsiella and Proteus that were grown from a culture. She completed this course of antibiotics. She saw her OB/GYN again on July 1. The wound was irrigated and debrided then subsequently packed with calcium alginate. She was referred to the wound clinic for further evaluation and management. The center portion of her cesarean section wound is open. It is about 5 cm deep and there is exposed Vicryl suture. No fascial violation is appreciated. On the right, there is a tunnel that extends for about 3 cm laterally. There is some fat necrosis and a little bit of slough present  but otherwise it is quite clean. 11/17/2021: The wound is smaller and shallower today. There is still some tunneling. The drainage is a little bit cloudy. She has been using calcium alginate to pack the wound. We are working on getting her wound VAC and possibly home health, but this has not yet been approved. 11/24/2021: The wound continues to contract. Is narrower and shallower. There is still some lateral tunneling. She does have her wound VAC with her today. Patient History Information  obtained from Patient, Chart. Family History Diabetes - Father,Paternal Grandparents, Heart Disease - Father, Hypertension - Mother,Father,Maternal Grandparents, Lung Disease - [place], Stroke - Paternal Grandparents, No family history of Cancer, Hereditary Spherocytosis, Seizures, Thyroid Problems, Tuberculosis. Social History Never smoker, Marital Status - Married, Alcohol Use - Never, Drug Use - No History, Caffeine Use - Daily. Medical History Eyes Denies history of Cataracts, Glaucoma Ear/Nose/Mouth/Throat Denies history of Chronic sinus problems/congestion, Middle ear problems Respiratory Patient has history of Asthma - during pregnancy Cardiovascular Patient has history of Hypertension Endocrine Denies history of Type I Diabetes, Type II Diabetes Genitourinary Denies history of End Stage Renal Disease Integumentary (Skin) Denies history of History of Burn Oncologic Denies history of Received Chemotherapy, Received Radiation Psychiatric Patient has history of Confinement Anxiety Denies history of Anorexia/bulimia Hospitalization/Surgery History - myomectomy. - hysteroscopy. - c-section. Medical A Surgical History Notes nd Constitutional Symptoms (General Health) morbid obesity Objective Constitutional No acute distress.. Vitals Time Taken: 11:27 AM, Height: 65 in, Weight: 285 lbs, BMI: 47.4, Temperature: 98.5 F, Pulse: 90 bpm, Respiratory Rate: 18 breaths/min, Blood Pressure:  125/82 mmHg. Respiratory Normal work of breathing on room air.. General Notes: 11/24/2021: The wound continues to contract. Is narrower and shallower. There is still some lateral tunneling. Integumentary (Hair, Skin) Wound #1 status is Open. Original cause of wound was Surgical Injury. The date acquired was: 10/15/2021. The wound has been in treatment 2 weeks. The wound is located on the Abdomen - Lower Quadrant. The wound measures 2cm length x 8.3cm width x 2.4cm depth; 13.038cm^2 area and 31.29cm^3 volume. There is Fat Layer (Subcutaneous Tissue) exposed. There is no undermining noted, however, there is tunneling at 3:00 with a maximum distance of 2.8cm. There is additional tunneling and at 9:00 with a maximum distance of 3.2cm. There is a large amount of serosanguineous drainage noted. The wound margin is distinct with the outline attached to the wound base. There is large (67-100%) red granulation within the wound bed. There is a small (1-33%) amount of necrotic tissue within the wound bed including Adherent Slough. Wound #2 status is Open. Original cause of wound was Shear/Friction. The date acquired was: 10/15/2021. The wound has been in treatment 2 weeks. The wound is located on the Pubis. The wound measures 0.6cm length x 0.6cm width x 0.1cm depth; 0.283cm^2 area and 0.028cm^3 volume. There is Fat Layer (Subcutaneous Tissue) exposed. There is no tunneling or undermining noted. There is a small amount of serosanguineous drainage noted. The wound margin is flat and intact. There is large (67-100%) red granulation within the wound bed. There is no necrotic tissue within the wound bed. Assessment Active Problems ICD-10 Non-pressure chronic ulcer of skin of other sites with unspecified severity Disruption of external operation (surgical) wound, not elsewhere classified, sequela Morbid (severe) obesity due to excess calories Essential (primary) hypertension Plan Follow-up Appointments: Return  Appointment in 1 week. - Dr. Celine Ahr RM 1 with Dolan Amen, 7/31 '@2'$ :00pm Bathing/ Shower/ Hygiene: May shower and wash wound with soap and water. Negative Presssure Wound Therapy: Wound Vac to wound continuously at 157m/hg pressure - initiated today, 7/24 Black Foam WOUND #1: - Abdomen - Lower Quadrant Wound Laterality: Prim Dressing: vac 2 x Per Week/30 Days ary WOUND #2: - Pubis Wound Laterality: Prim Dressing: KerraCel Ag Gelling Fiber Dressing, 2x2 in (silver alginate) 2 x Per Week/30 Days ary Discharge Instructions: Apply silver alginate to wound bed as instructed Secondary Dressing: Bordered Gauze, 2x3.75 in 2 x Per Week/30 Days Discharge Instructions: Apply over  primary dressing as directed. 11/24/2021: The wound continues to contract. Is narrower and shallower. There is still some lateral tunneling. No debridement was necessary today. We will apply her wound VAC in clinic. She does not have home health, but her significant other is able to change the VAC. Follow-up in 1 week. Electronic Signature(s) Signed: 11/24/2021 12:00:55 PM By: Fredirick Maudlin MD FACS Entered By: Fredirick Maudlin on 11/24/2021 12:00:55 -------------------------------------------------------------------------------- HxROS Details Patient Name: Date of Service: Joann [name], Joann [name]. 11/24/2021 11:15 A M Medical Record Number: [id number] Patient Account Number: [id number] Date of Birth/Sex: Treating RN: 20-Jan-1981 (41 y.o. [name] Primary Care Provider: PCP, NO Other Clinician: Referring Provider: Treating Provider/Extender: Romilda Garret, Sheronette A Weeks in Treatment: 2 Information Obtained From Patient Chart Constitutional Symptoms (General Health) Medical History: Past Medical History Notes: morbid obesity Eyes Medical History: Negative for: Cataracts; Glaucoma Ear/Nose/Mouth/Throat Medical History: Negative for: Chronic sinus problems/congestion; Middle ear  problems Respiratory Medical History: Positive for: Asthma - during pregnancy Cardiovascular Medical History: Positive for: Hypertension Endocrine Medical History: Negative for: Type I Diabetes; Type II Diabetes Genitourinary Medical History: Negative for: End Stage Renal Disease Integumentary (Skin) Medical History: Negative for: History of Burn Oncologic Medical History: Negative for: Received Chemotherapy; Received Radiation Psychiatric Medical History: Positive for: Confinement Anxiety Negative for: Anorexia/bulimia Immunizations Pneumococcal Vaccine: Received Pneumococcal Vaccination: No Implantable Devices None Hospitalization / Surgery History Type of Hospitalization/Surgery myomectomy hysteroscopy c-section Family and Social History Cancer: No; Diabetes: Yes - Father,Paternal Grandparents; Heart Disease: Yes - Father; Hereditary Spherocytosis: No; Hypertension: Yes - Mother,Father,Maternal Grandparents; Lung Disease: Yes - Mother,Siblings; Seizures: No; Stroke: Yes - Paternal Grandparents; Thyroid Problems: No; Tuberculosis: No; Never smoker; Marital Status - Married; Alcohol Use: Never; Drug Use: No History; Caffeine Use: Daily; Financial Concerns: No; Food, Clothing or Shelter Needs: No; Support System Lacking: No; Transportation Concerns: No Engineer, maintenance) Signed: 11/24/2021 3:41:11 PM By: Fredirick Maudlin MD FACS Signed: 11/24/2021 5:44:10 PM By: Baruch Gouty RN, BSN Entered By: Fredirick Maudlin on 11/24/2021 11:59:54 -------------------------------------------------------------------------------- SuperBill Details Patient Name: Date of Service: Joann [name], Joann TO [place] [name]. 11/24/2021 Medical Record Number: [id number] Patient Account Number: [id number] Date of Birth/Sex: Treating RN: 04-09-1981 (41 y.o. 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 T81.31XS Disruption of external operation (surgical) wound, not elsewhere classified, sequela E66.01 Morbid (severe) obesity due to excess calories I10 Essential (primary) hypertension Facility Procedures CPT4 Code: [id number] Description: 01093 - WOUND VAC-50 SQ CM OR LESS Modifier: Quantity: 1 Physician Procedures : CPT4 Code Description Modifier [id number] - WC PHYS LEVEL 4 - EST PT ICD-10 Diagnosis Description 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

## 2021-12-01 ENCOUNTER — Encounter (HOSPITAL_BASED_OUTPATIENT_CLINIC_OR_DEPARTMENT_OTHER): Payer: No Typology Code available for payment source | Admitting: General Surgery

## 2021-12-01 DIAGNOSIS — L98499 Non-pressure chronic ulcer of skin of other sites with unspecified severity: Secondary | ICD-10-CM | POA: Diagnosis not present

## 2021-12-01 NOTE — Progress Notes (Signed)
Joann [name], Joann [name] ([id number]) Visit Report for 12/01/2021 Chief Complaint Document Details Patient Name: Date of Service: SO LO [place], [name] 12/01/2021 2:00 PM Medical Record Number: [id number] Patient Account Number: [id number] Date of Birth/Sex: Treating RN: 02/08/1981 (41 y.o. [name] Primary Care Provider: PCP, NO Other Clinician: Referring Provider: Treating Provider/Extender: Romilda Garret, Sheronette A Weeks in Treatment: 3 Information Obtained from: Patient Chief Complaint Patient presents to the wound care center with open non-healing surgical wound(s) Electronic Signature(s) Signed: 12/01/2021 2:46:59 PM By: Fredirick Maudlin MD FACS Entered By: Fredirick Maudlin on 12/01/2021 14:46:59 -------------------------------------------------------------------------------- HPI Details Patient Name: Date of Service: [name], [name] 12/01/2021 2:00 PM Medical Record Number: [id number] Patient Account Number: [id number] Date of Birth/Sex: Treating RN: August 22, 1980 (41 y.o. [name] Primary Care Provider: PCP, NO Other Clinician: Referring Provider: Treating Provider/Extender: Romilda Garret, Sheronette A Weeks in Treatment: 3 History of Present Illness HPI Description: ADMISSION 11/07/2021 This is a 41 year old morbidly obese woman who had a cesarean section on October 15, 2021. Her wound subsequently became infected and had to be reopened. She was prescribed Bactrim by her OB/GYN for Klebsiella and Proteus that were grown from a culture. She completed this course of antibiotics. She saw her OB/GYN again on July 1. The wound was irrigated and debrided then subsequently packed with calcium alginate. She was referred to the wound clinic for further evaluation and management. The center portion of her cesarean section wound is open. It is about 5 cm deep and there is exposed Vicryl suture. No fascial violation is appreciated. On the right,  there is a tunnel that extends for about 3 cm laterally. There is some fat necrosis and a little bit of slough present but otherwise it is quite clean. 11/17/2021: The wound is smaller and shallower today. There is still some tunneling. The drainage is a little bit cloudy. She has been using calcium alginate to pack the wound. We are working on getting her wound VAC and possibly home health, but this has not yet been approved. 11/24/2021: The wound continues to contract. Is narrower and shallower. There is still some lateral tunneling. She does have her wound VAC with her today. 12/01/2021: She contacted our office midweek due to concerns about odor from the wound. We asked her to remove the wound VAC and just do Dakin's wet-to- dry dressing changes. Apparently, however, the black sponge was not removed from the base of the wound and it continued to fester. She never got the Dakin's solution and just did silver alginate dressing changes. T oday, she continues to have a strong odor coming from her wound. There is hypertrophic granulation tissue along the right lateral portion of the wound as well as at the pubis. The tunneling has more or less closed, however. The periwound is a bit edematous and erythematous. Despite the appearance of potential wound infection, the surface of the open portion of the wound actually looks quite healthy. Electronic Signature(s) Signed: 12/01/2021 2:49:11 PM By: Fredirick Maudlin MD FACS Entered By: Fredirick Maudlin on 12/01/2021 14:49:10 -------------------------------------------------------------------------------- Physical Exam Details Patient Name: Date of Service: [name], [name] 12/01/2021 2:00 PM Medical Record Number: [id number] Patient Account Number: [id number] Date of Birth/Sex: Treating RN: 16-Jan-1981 (41 y.o. [name] Primary Care Provider: PCP, NO Other Clinician: Referring Provider: Treating Provider/Extender: Romilda Garret,  Sheronette A Weeks in Treatment: 3 Constitutional . . . . No acute distress.Marland Kitchen  Respiratory Normal work of breathing on room air.. Notes 12/01/2021: There is a strong odor coming from her wound. There is hypertrophic granulation tissue along the right lateral portion of the wound as well as at the pubis. The tunneling has more or less closed, however. The periwound is a bit edematous and erythematous. Despite the appearance of potential wound infection, the surface of the open portion of the wound actually looks quite healthy. The overall wound size is decreased. Electronic Signature(s) Signed: 12/01/2021 2:49:58 PM By: Fredirick Maudlin MD FACS Entered By: Fredirick Maudlin on 12/01/2021 14:49:58 -------------------------------------------------------------------------------- Problem List Details Patient Name: Date of Service: [name], [name] 12/01/2021 2:00 PM Medical Record Number: [id number] Patient Account Number: [id number] Date of Birth/Sex: Treating RN: 08-17-1980 (41 y.o. 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  12/01/2021 2:00 PM Medical Record Number: [id number] Patient Account Number: [id number] Date of Birth/Sex: Treating RN: [birth year] (41 y.o. [name] Primary Care Provider: PCP, NO Other Clinician: Referring Provider: Treating Provider/Extender: Romilda Garret, Sheronette A Weeks in Treatment: 3 Information Obtained From Patient Chart Constitutional Symptoms (General Health) Medical History: Past Medical History Notes: morbid obesity Eyes Medical History: Negative for: Cataracts; Glaucoma Ear/Nose/Mouth/Throat Medical History: Negative for: Chronic sinus problems/congestion; Middle ear problems Respiratory Medical History: Positive for: Asthma - during pregnancy Cardiovascular Medical History: Positive for: Hypertension Endocrine Medical History: Negative for: Type I Diabetes; Type II Diabetes Genitourinary Medical History: Negative for: End Stage Renal Disease Integumentary (Skin) Medical History: Negative for: History of Burn Oncologic Medical History: Negative for: Received Chemotherapy; Received Radiation Psychiatric Medical History: Positive for: Confinement Anxiety Negative for: Anorexia/bulimia Immunizations Pneumococcal Vaccine: Received Pneumococcal Vaccination: No Implantable Devices None Hospitalization / Surgery History Type of Hospitalization/Surgery myomectomy hysteroscopy c-section Family and Social History Cancer: No; Diabetes: Yes - Father,Paternal Grandparents; Heart Disease: Yes - Father; Hereditary Spherocytosis: No; Hypertension: Yes - Mother,Father,Maternal Grandparents; Lung Disease: Yes - Mother,Siblings; Seizures: No; Stroke: Yes - Paternal Grandparents; Thyroid Problems: No; Tuberculosis: No; Never smoker; Marital Status - Married; Alcohol Use: Never; Drug Use: No History; Caffeine Use: Daily; Financial Concerns: No; Food, Clothing or Shelter Needs: No; Support System Lacking: No; Transportation Concerns:  No Electronic Signature(s) Unsigned Entered By: Fredirick Maudlin on 12/01/2021 14:49:16 Signature(s): Date(s):

## 2021-12-01 NOTE — Progress Notes (Signed)
[name], [name] ([id number]) Visit Report for 12/01/2021 Arrival Information Details Patient Name: Date of Service: Joann [name], Joann [name] TO [place] D. 12/01/2021 2:00 PM Medical Record Number: [id number] Patient Account Number: [id number] Date of Birth/Sex: Treating RN: 1981/02/18 (41 y.o. Joann [name], Joann [name] Primary Care Gerene Nedd: PCP, NO Other Clinician: Referring Cyd Hostler: Treating Schylar Allard/Extender: Romilda Garret, Sheronette A Weeks in Treatment: 3 Visit Information History Since Last Visit Added or deleted any medications: No Patient Arrived: Ambulatory Any new allergies or adverse reactions: No Arrival Time: 14:18 Had a fall or experienced change in No Accompanied By: spouse activities of daily living that may affect Transfer Assistance: None risk of falls: Patient Identification Verified: Yes Signs or symptoms of abuse/neglect since last visito No Secondary Verification Process Completed: Yes Hospitalized since last visit: No Patient Requires Transmission-Based Precautions: No Implantable device outside of the clinic excluding No Patient Has Alerts: No cellular tissue based products placed in the center since last visit: Has Dressing in Place as Prescribed: Yes Pain Present Now: Yes Electronic Signature(s) Signed: 12/01/2021 4:54:44 PM By: Baruch Gouty RN, BSN Entered By: Baruch Gouty on 12/01/2021 14:19:19 -------------------------------------------------------------------------------- Encounter Discharge Information Details Patient Name: Date of Service: Joann [name], Joann [name] D. 12/01/2021 2:00 PM Medical Record Number: [id number] Patient Account Number: [id number] Date of Birth/Sex: Treating RN: 1980/11/06 (41 y.o. Joann [name] Primary Care Jaree Trinka: PCP, NO Other Clinician: Referring Bethanny Toelle: Treating Elio Haden/Extender: Romilda Garret, Sheronette A Weeks in Treatment: 3 Encounter Discharge Information Items Discharge Condition:  Stable Ambulatory Status: Ambulatory Discharge Destination: Home Transportation: Private Auto Accompanied By: spouse Schedule Follow-up Appointment: Yes Clinical Summary of Care: Patient Declined Electronic Signature(s) Signed: 12/01/2021 4:54:44 PM By: Baruch Gouty RN, BSN Entered By: Baruch Gouty on 12/01/2021 15:09:46 -------------------------------------------------------------------------------- Lower Extremity Assessment Details Patient Name: Date of Service: Joann [name], Joann [name] D. 12/01/2021 2:00 PM Medical Record Number: [id number] Patient Account Number: [id number] Date of Birth/Sex: Treating RN: 06-07-1980 (41 y.o. Joann [name] Primary Care Joann [name]: PCP, NO Other Clinician: Referring Joann [name]: Treating Joann [name]/Extender: Romilda Garret, Sheronette A Weeks in Treatment: 3 Electronic Signature(s) Signed: 12/01/2021 4:54:44 PM By: Baruch Gouty RN, BSN Entered By: Baruch Gouty on 12/01/2021 14:20:45 -------------------------------------------------------------------------------- Multi Wound Chart Details Patient Name: Date of Service: Joann [name], Joann [name] D. 12/01/2021 2:00 PM Medical Record Number: [id number] Patient Account Number: [id number] Date of Birth/Sex: Treating RN: [birth year] (41 y.o. Joann [name] Primary Care Joann [name]: PCP, NO Other Clinician: Referring Joann [name]: Treating Joann [name]/Extender: Romilda Garret, Sheronette A Weeks in Treatment: 3 Vital Signs Height(in): 65 Pulse(bpm): 73 Weight(lbs): 285 Blood Pressure(mmHg): 129/80 Body Mass Index(BMI): 47.4 Temperature(F): 98.3 Respiratory Rate(breaths/min): 18 Photos: [[name]/A:[name]/A] Abdomen - Lower Quadrant Pubis [name]/A Wound Location: Surgical Injury Shear/Friction [name]/A Wounding Event: Open Surgical Wound Abrasion [name]/A Primary Etiology: Asthma, Hypertension, Confinement Asthma, Hypertension, Confinement [name]/A Comorbid History: Anxiety Anxiety 10/15/2021 10/15/2021  [name]/A Date Acquired: 3 3 [name]/A Weeks of Treatment: Open Open [name]/A Wound Status: No No [name]/A Wound Recurrence: 2x7x2.3 0.4x0.6x0.1 [name]/A Measurements L x W x D (cm) 10.996 0.188 [name]/A A (cm) : rea 25.29 0.019 [name]/A Volume (cm) : 56.20% -382.10% [name]/A % Reduction in A rea: 79.90% -375.00% [name]/A % Reduction in Volume: 3 Position 1 (o'clock): 1.4 Maximum Distance 1 (cm): Yes No [name]/A Tunneling: Full Thickness Without Exposed Full Thickness Without Exposed [name]/A Classification: Support Structures Support Structures Large Small [name]/A Exudate Amount: Serosanguineous Serous [name]/A Exudate Type: red, brown amber [name]/A Exudate Color: Distinct, outline  attached Flat and Intact [name]/A Wound Margin: Large (67-100%) Large (67-100%) [name]/A Granulation Amount: Red Red [name]/A Granulation Quality: None Present (0%) None Present (0%) [name]/A Necrotic Amount: Fat Layer (Subcutaneous Tissue): Yes Fat Layer (Subcutaneous Tissue): Yes [name]/A Exposed Structures: Fascia: No Fascia: No Tendon: No Tendon: No Muscle: No Muscle: No Joint: No Joint: No Bone: No Bone: No Small (1-33%) Large (67-100%) [name]/A Epithelialization: Treatment Notes Electronic Signature(s) Signed: 12/01/2021 2:46:53 PM By: Fredirick Maudlin MD FACS Signed: 12/01/2021 4:54:44 PM By: Baruch Gouty RN, BSN Entered By: Fredirick [name] on 12/01/2021 14:46:53 -------------------------------------------------------------------------------- Multi-Disciplinary Care Plan Details Patient Name: Date of Service: Joann [name], Joann [name] D. 12/01/2021 2:00 PM Medical Record Number: [id number] Patient Account Number: [id number] Date of Birth/Sex: Treating RN: 01/31/1981 (41 y.o. Joann [name] Primary Care Joann [name]: PCP, NO Other Clinician: Referring Joann [name]: Treating Joann [name]/Extender: Micheal Likens in Treatment: 3 Multidisciplinary Care Plan reviewed with physician Active Inactive Wound/Skin Impairment Nursing  Diagnoses: Impaired tissue integrity Knowledge deficit related to ulceration/compromised skin integrity Goals: Patient/caregiver will verbalize understanding of skin care regimen Date Initiated: 11/17/2021 Target Resolution Date: 12/29/2021 Goal Status: Active Ulcer/skin breakdown will have a volume reduction of 30% by week 4 Date Initiated: 11/17/2021 Date Inactivated: 12/01/2021 Target Resolution Date: 12/01/2021 Goal Status: Met Ulcer/skin breakdown will have a volume reduction of 50% by week 8 Date Initiated: 12/01/2021 Target Resolution Date: 12/29/2021 Goal Status: Active Interventions: Assess patient/caregiver ability to obtain necessary supplies Assess patient/caregiver ability to perform ulcer/skin care regimen upon admission and as needed Assess ulceration(s) every visit Treatment Activities: Skin care regimen initiated : 11/17/2021 Topical wound management initiated : 11/17/2021 Notes: Electronic Signature(s) Signed: 12/01/2021 4:54:44 PM By: Baruch Gouty RN, BSN Entered By: Baruch Gouty on 12/01/2021 14:29:50 -------------------------------------------------------------------------------- Negative Pressure Wound Therapy Maintenance (NPWT) Details Patient Name: Date of Service: Joann [name], Joann TO [place] D. 12/01/2021 2:00 PM Medical Record Number: [id number] Patient Account Number: [id number] Date of Birth/Sex: Treating RN: 14-Jan-1981 (41 y.o. Joann [name] Primary Care Shrita Thien: PCP, NO Other Clinician: Referring Merric Yost: Treating Jak Haggar/Extender: Romilda Garret, Sheronette A Weeks in Treatment: 3 NPWT Maintenance Performed for: Wound #1 Abdomen - Lower Quadrant Additional Injuries Covered: No Performed By: Baruch Gouty, RN Type: VAC System Coverage Size (sq cm): 14 Pressure Type: Constant Pressure Setting: 125 mmHG Drain Type: None Primary Contact: Other : gentamicin ointment Sponge/Dressing Type: Foam, Black Date Initiated:  11/24/2021 Dressing Removed: No Quantity of Sponges/Gauze Removed: 1 Canister Changed: No Dressing Reapplied: No Quantity of Sponges/Gauze Inserted: 1 Respones T Treatment: o good Days On NPWT : 8 Post Procedure Diagnosis Same as Pre-procedure Electronic Signature(s) Signed: 12/01/2021 4:54:44 PM By: Baruch Gouty RN, BSN Entered By: Baruch Gouty on 12/01/2021 14:52:26 -------------------------------------------------------------------------------- Pain Assessment Details Patient Name: Date of Service: Joann [name], Joann [name] D. 12/01/2021 2:00 PM Medical Record Number: [id number] Patient Account Number: [id number] Date of Birth/Sex: Treating RN: [birth year] (41 y.o. Joann [name] Primary Care Eion Timbrook: PCP, NO Other Clinician: Referring Luana Tatro: Treating Alberta Cairns/Extender: Romilda Garret, Sheronette A Weeks in Treatment: 3 Active Problems Location of Pain Severity and Description of Pain Patient Has Paino Yes Site Locations Pain Location: Pain Location: Pain in Ulcers With Dressing Change: Yes Duration of the Pain. Constant / Intermittento Intermittent Rate the pain. Current Pain Level: 5 Character of Pain Describe the Pain: Aching, Tender Pain Management and Medication Current Pain Management: Medication: Yes Is the Current Pain Management Adequate: Adequate How does your wound impact  your activities of daily livingo Sleep: Yes Bathing: No Appetite: No Relationship With Others: No Bladder Continence: No Emotions: No Bowel Continence: No Work: No Toileting: No Drive: No Dressing: No Hobbies: No Electronic Signature(s) Signed: 12/01/2021 4:54:44 PM By: Baruch Gouty RN, BSN Entered By: Baruch Gouty on 12/01/2021 14:20:37 -------------------------------------------------------------------------------- Patient/Caregiver Education Details Patient Name: Date of Service: Joann Deneise Lever, Joann [name] D. 7/31/2023andnbsp2:00 PM Medical Record  Number: [id number] Patient Account Number: [id number] Date of Birth/Gender: Treating RN: April 26, 1981 (41 y.o. Joann [name] Primary Care Physician: PCP, NO Other Clinician: Referring Physician: Treating Physician/Extender: Micheal Likens in Treatment: 3 Education Assessment Education Provided To: Patient Education Topics Provided Infection: Methods: Explain/Verbal Responses: Reinforcements needed, State content correctly Wound Debridement: Methods: Explain/Verbal Responses: State content correctly Electronic Signature(s) Signed: 12/01/2021 4:54:44 PM By: Baruch Gouty RN, BSN Entered By: Baruch Gouty on 12/01/2021 14:30:28 -------------------------------------------------------------------------------- Wound Assessment Details Patient Name: Date of Service: Joann [name], Joann [name] D. 12/01/2021 2:00 PM Medical Record Number: [id number] Patient Account Number: [id number] Date of Birth/Sex: Treating RN: 16-Aug-1980 (41 y.o. Joann [name] Primary Care Jaxyn Mestas: PCP, NO Other Clinician: Referring Tameisha Covell: Treating Riggs Dineen/Extender: Romilda Garret, Sheronette A Weeks in Treatment: 3 Wound Status Wound Number: 1 Primary Etiology: Open Surgical Wound Wound Location: Abdomen - Lower Quadrant Wound Status: Open Wounding Event: Surgical Injury Comorbid History: Asthma, Hypertension, Confinement Anxiety Date Acquired: 10/15/2021 Weeks Of Treatment: 3 Clustered Wound: No Photos Wound Measurements Length: (cm) 2 Width: (cm) 7 Depth: (cm) 2.3 Area: (cm) 10.996 Volume: (cm) 25.29 % Reduction in Area: 56.2% % Reduction in Volume: 79.9% Epithelialization: Small (1-33%) Tunneling: Yes Position (o'clock): 3 Maximum Distance: (cm) 1.4 Undermining: No Wound Description Classification: Full Thickness Without Exposed Support Structures Wound Margin: Distinct, outline attached Exudate Amount: Large Exudate Type:  Serosanguineous Exudate Color: red, brown Foul Odor After Cleansing: No Slough/Fibrino Yes Wound Bed Granulation Amount: Large (67-100%) Exposed Structure Granulation Quality: Red Fascia Exposed: No Necrotic Amount: None Present (0%) Fat Layer (Subcutaneous Tissue) Exposed: Yes Tendon Exposed: No Muscle Exposed: No Joint Exposed: No Bone Exposed: No Treatment Notes Wound #1 (Abdomen - Lower Quadrant) Cleanser Peri-Wound Care Topical Gentamicin Discharge Instruction: thin layer in base of wound bed Primary Dressing vac Secondary Dressing Secured With Compression Wrap Compression Stockings Add-Ons Electronic Signature(s) Signed: 12/01/2021 4:54:44 PM By: Baruch Gouty RN, BSN Entered By: Baruch Gouty on 12/01/2021 14:34:00 -------------------------------------------------------------------------------- Wound Assessment Details Patient Name: Date of Service: Joann [name], Joann [name] D. 12/01/2021 2:00 PM Medical Record Number: [id number] Patient Account Number: [id number] Date of Birth/Sex: Treating RN: Oct 29, 1980 (41 y.o. Joann [name] Primary Care Namita Yearwood: PCP, NO Other Clinician: Referring Kyleena Scheirer: Treating Alger Kerstein/Extender: Romilda Garret, Sheronette A Weeks in Treatment: 3 Wound Status Wound Number: 2 Primary Etiology: Abrasion Wound Location: Pubis Wound Status: Open Wounding Event: Shear/Friction Comorbid History: Asthma, Hypertension, Confinement Anxiety Date Acquired: 10/15/2021 Weeks Of Treatment: 3 Clustered Wound: No Photos Wound Measurements Length: (cm) 0.4 Width: (cm) 0.6 Depth: (cm) 0.1 Area: (cm) 0.188 Volume: (cm) 0.019 % Reduction in Area: -382.1% % Reduction in Volume: -375% Epithelialization: Large (67-100%) Tunneling: No Undermining: No Wound Description Classification: Full Thickness Without Exposed Support Structures Wound Margin: Flat and Intact Exudate Amount: Small Exudate Type: Serous Exudate Color:  amber Foul Odor After Cleansing: No Slough/Fibrino No Wound Bed Granulation Amount: Large (67-100%) Exposed Structure Granulation Quality: Red Fascia Exposed: No Necrotic Amount: None Present (0%) Fat Layer (Subcutaneous Tissue) Exposed: Yes Tendon Exposed:  No Muscle Exposed: No Joint Exposed: No Bone Exposed: No Treatment Notes Wound #2 (Pubis) Cleanser Peri-Wound Care Topical Primary Dressing KerraCel Ag Gelling Fiber Dressing, 2x2 in (silver alginate) Discharge Instruction: Apply silver alginate to wound bed as instructed Secondary Dressing Bordered Gauze, 2x3.75 in Discharge Instruction: Apply over primary dressing as directed. Secured With Compression Wrap Compression Stockings Environmental education officer) Signed: 12/01/2021 4:54:44 PM By: Baruch Gouty RN, BSN Entered By: Baruch Gouty on 12/01/2021 14:33:28 -------------------------------------------------------------------------------- Vitals Details Patient Name: Date of Service: Joann [name], Joann [name] D. 12/01/2021 2:00 PM Medical Record Number: [id number] Patient Account Number: [id number] Date of Birth/Sex: Treating RN: 1981/01/08 (41 y.o. Joann [name] Primary Care Aldene Hendon: PCP, NO Other Clinician: Referring Torrey Horseman: Treating Sanoe Hazan/Extender: Romilda Garret, Sheronette A Weeks in Treatment: 3 Vital Signs Time Taken: 14:19 Temperature (F): 98.3 Height (in): 65 Pulse (bpm): 73 Weight (lbs): 285 Respiratory Rate (breaths/min): 18 Body Mass Index (BMI): 47.4 Blood Pressure (mmHg): 129/80 Reference Range: 80 - 120 mg / dl Electronic Signature(s) Signed: 12/01/2021 4:54:44 PM By: Baruch Gouty RN, BSN Entered By: Baruch Gouty on 12/01/2021 14:20:04

## 2021-12-08 ENCOUNTER — Encounter (HOSPITAL_BASED_OUTPATIENT_CLINIC_OR_DEPARTMENT_OTHER): Payer: No Typology Code available for payment source | Admitting: General Surgery

## 2021-12-15 ENCOUNTER — Encounter (HOSPITAL_BASED_OUTPATIENT_CLINIC_OR_DEPARTMENT_OTHER): Payer: No Typology Code available for payment source | Attending: General Surgery | Admitting: General Surgery

## 2021-12-15 DIAGNOSIS — L98498 Non-pressure chronic ulcer of skin of other sites with other specified severity: Secondary | ICD-10-CM | POA: Diagnosis not present

## 2021-12-15 DIAGNOSIS — L98492 Non-pressure chronic ulcer of skin of other sites with fat layer exposed: Secondary | ICD-10-CM | POA: Insufficient documentation

## 2021-12-15 DIAGNOSIS — T8131XA Disruption of external operation (surgical) wound, not elsewhere classified, initial encounter: Secondary | ICD-10-CM | POA: Diagnosis not present

## 2021-12-15 DIAGNOSIS — I10 Essential (primary) hypertension: Secondary | ICD-10-CM | POA: Insufficient documentation

## 2021-12-15 DIAGNOSIS — O900 Disruption of cesarean delivery wound: Secondary | ICD-10-CM | POA: Diagnosis not present

## 2021-12-15 DIAGNOSIS — Z6841 Body Mass Index (BMI) 40.0 and over, adult: Secondary | ICD-10-CM | POA: Insufficient documentation

## 2021-12-19 NOTE — Progress Notes (Addendum)
Joann, [name] ([id number]) Visit Report for 12/15/2021 Chief Complaint Document Details Patient Name: Date of Service: Joann [name], Joann [name] TO [place] D. 12/15/2021 2:45 PM Medical Record Number: [id number] Patient Account Number: [id number] Date of Birth/Sex: Treating RN: 1980-07-06 (41 y.o. [name] Primary Care Provider: PCP, NO Other Clinician: Referring Provider: Treating Provider/Extender: Romilda Garret, Sheronette A Weeks in Treatment: 5 Information Obtained from: Patient Chief Complaint Patient presents to the wound care center with open non-healing surgical wound(s) Electronic Signature(s) Signed: 12/15/2021 3:43:54 PM By: Fredirick Maudlin MD FACS Entered By: Fredirick Maudlin on 12/15/2021 15:43:54 -------------------------------------------------------------------------------- HPI Details Patient Name: Date of Service: Joann [name], [name] 12/15/2021 2:45 PM Medical Record Number: [id number] Patient Account Number: [id number] Date of Birth/Sex: Treating RN: [birth year] (41 y.o. [name] Primary Care Provider: PCP, NO Other Clinician: Referring Provider: Treating Provider/Extender: Romilda Garret, Sheronette A Weeks in Treatment: 5 History of Present Illness HPI Description: ADMISSION 11/07/2021 This is a 41 year old morbidly obese woman who had a cesarean section on October 15, 2021. Her wound subsequently became infected and had to be reopened. She was prescribed Bactrim by her OB/GYN for Klebsiella and Proteus that were grown from a culture. She completed this course of antibiotics. She saw her OB/GYN again on July 1. The wound was irrigated and debrided then subsequently packed with calcium alginate. She was referred to the wound clinic for further evaluation and management. The center portion of her cesarean section wound is open. It is about 5 cm deep and there is exposed Vicryl suture. No fascial violation is appreciated. On the right,  there is a tunnel that extends for about 3 cm laterally. There is some fat necrosis and a little bit of slough present but otherwise it is quite clean. 11/17/2021: The wound is smaller and shallower today. There is still some tunneling. The drainage is a little bit cloudy. She has been using calcium alginate to pack the wound. We are working on getting her wound VAC and possibly home health, but this has not yet been approved. 11/24/2021: The wound continues to contract. Is narrower and shallower. There is still some lateral tunneling. She does have her wound VAC with her today. 12/01/2021: She contacted our office midweek due to concerns about odor from the wound. We asked her to remove the wound VAC and just do Dakin's wet-to- dry dressing changes. Apparently, however, the black sponge was not removed from the base of the wound and it continued to fester. She never got the Dakin's solution and just did silver alginate dressing changes. Joann [name], she continues to have a strong odor coming from her wound. There is hypertrophic granulation tissue along the right lateral portion of the wound as well as at the pubis. The tunneling has more or less closed, however. The periwound is a bit edematous and erythematous. Despite the appearance of potential wound infection, the surface of the open portion of the wound actually looks quite healthy. 12/15/2021: She completed her course of oral Augmentin. Her C-section wound wound has continued to contract and fill with good granulation tissue. No odor or concern for infection. It is very shallow at this point. The wound on her pubis has healed. Electronic Signature(s) Signed: 12/15/2021 3:44:46 PM By: Fredirick Maudlin MD FACS Entered By: Fredirick Maudlin on 12/15/2021 15:44:46 -------------------------------------------------------------------------------- Physical Exam Details Patient Name: Date of Service: Joann [name], [name] 12/15/2021 2:45 PM Medical Record  Number: [id number] Patient  Account Number: [id number] Date of Birth/Sex: Treating RN: [birth year] (41 y.o. [name] Primary Care Provider: PCP, NO Other Clinician: Referring Provider: Treating Provider/Extender: Romilda Garret, Sheronette A Weeks in Treatment: 5 Constitutional . Slightly tachycardic, asymptomatic. . . No acute distress.Marland Kitchen Respiratory Normal work of breathing on room air.. Notes 12/15/2021: Her C-section wound wound has continued to contract and fill with good granulation tissue. No odor or concern for infection. It is very shallow at this point. The wound on her pubis has healed. Electronic Signature(s) Signed: 12/15/2021 3:45:48 PM By: Fredirick Maudlin MD FACS Entered By: Fredirick Maudlin on 12/15/2021 15:45:47 -------------------------------------------------------------------------------- Physician Orders Details Patient Name: Date of Service: Joann [name], [name] 12/15/2021 2:45 PM Medical Record Number: [id number] Patient Account Number: [id number] Date of Birth/Sex: Treating RN: [birth year] (41 y.o. 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 - Dr. Celine Ahr RM 1 with Vaughan Basta Return A Mon, 8/28 @ 2:45 pm Bathing/ Shower/ Hygiene May shower and wash wound with soap and water. Negative Presssure Wound Therapy Discontinue wound vac - as of 12/15/21 Wound Treatment Wound #1 - Abdomen - Lower Quadrant Prim Dressing: KerraCel Ag  Gelling Fiber Dressing, 2x2 in (silver alginate) Every Other Day/30 Days ary Discharge Instructions: Apply silver alginate to wound bed as instructed Secondary Dressing: Woven Gauze Sponge, Non-Sterile 4x4 in Every Other Day/30 Days Discharge Instructions: Apply over primary dressing rolled to keep skin edges apart Secured With: 45M Medipore H Soft Cloth Surgical Joann ape, 4 x 10 (in/yd) Every Other Day/30 Days Discharge Instructions: Secure with tape as directed. Electronic Signature(s) Signed: 12/15/2021 4:21:09 PM By: Fredirick Maudlin MD FACS Signed: 12/15/2021 4:52:29 PM By: Baruch Gouty RN, BSN Entered By: Baruch Gouty on 12/15/2021 15:54:32 -------------------------------------------------------------------------------- Problem List Details Patient Name: Date of Service: Joann [name], [name] 12/15/2021 2:45 PM Medical Record Number: [id number] Patient Account Number: [id number] Date of Birth/Sex: Treating RN: 1980-12-04 (41 y.o. 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 12/15/2021 2:45 PM Medical Record Number:  [id number] Patient Account Number: [id number] Date of Birth/Sex: Treating RN: 1980/09/29 (41 y.o. [name] Primary Care Provider: PCP, NO Other Clinician: Referring Provider: Treating Provider/Extender: Romilda Garret, Sheronette A Weeks in Treatment: 5 Subjective Chief Complaint Information obtained from Patient Patient presents to the wound care center with open non-healing surgical wound(s) History of Present Illness (HPI) ADMISSION 11/07/2021 This is a 41 year old morbidly obese woman who had a  cesarean section on October 15, 2021. Her wound subsequently became infected and had to be reopened. She was prescribed Bactrim by her OB/GYN for Klebsiella and Proteus that were grown from a culture. She completed this course of antibiotics. She saw her OB/GYN again on July 1. The wound was irrigated and debrided then subsequently packed with calcium alginate. She was referred to the wound clinic for further evaluation and management. The center portion of her cesarean section wound is open. It is about 5 cm deep and there is exposed Vicryl suture. No fascial violation is appreciated. On the right, there is a tunnel that extends for about 3 cm laterally. There is some fat necrosis and a little bit of slough present but otherwise it is quite clean. 11/17/2021: The wound is smaller and shallower today. There is still some tunneling. The drainage is a little bit cloudy. She has been using calcium alginate to pack the wound. We are working on getting her wound VAC and possibly home health, but this has not yet been approved. 11/24/2021: The wound continues to contract. Is narrower and shallower. There is still some lateral tunneling. She does have her wound VAC with her today. 12/01/2021: She contacted our office midweek due to concerns about odor from the wound. We asked her to remove the wound VAC and just do Dakin's wet-to- dry dressing changes. Apparently, however, the black sponge was  not removed from the base of the wound and it continued to fester. She never got the Dakin's solution and just did silver alginate dressing changes. Joann [name], she continues to have a strong odor coming from her wound. There is hypertrophic granulation tissue along the right lateral portion of the wound as well as at the pubis. The tunneling has more or less closed, however. The periwound is a bit edematous and erythematous. Despite the appearance of potential wound infection, the surface of the open portion of the wound actually looks quite healthy. 12/15/2021: She completed her course of oral Augmentin. Her C-section wound wound has continued to contract and fill with good granulation tissue. No odor or concern for infection. It is very shallow at this point. The wound on her pubis has healed. Patient History Information obtained from Patient, Chart. Family History Diabetes - Father,Paternal Grandparents, Heart Disease - Father, Hypertension - Mother,Father,Maternal Grandparents, Lung Disease - [place], Stroke - Paternal Grandparents, No family history of Cancer, Hereditary Spherocytosis, Seizures, Thyroid Problems, Tuberculosis. Social History Never smoker, Marital Status - Married, Alcohol Use - Never, Drug Use - No History, Caffeine Use - Daily. Medical History Eyes Denies history of Cataracts, Glaucoma Ear/Nose/Mouth/Throat Denies history of Chronic sinus problems/congestion, Middle ear problems Respiratory Patient has history of Asthma - during pregnancy Cardiovascular Patient has history of Hypertension Endocrine Denies history of Type I Diabetes, Type II Diabetes Genitourinary Denies history of End Stage Renal Disease Integumentary (Skin) Denies history of History of Burn Oncologic Denies history of Received Chemotherapy, Received Radiation Psychiatric Patient has history of Confinement Anxiety Denies history of Anorexia/bulimia Hospitalization/Surgery History -  myomectomy. - hysteroscopy. - c-section. Medical A Surgical History Notes nd Constitutional Symptoms (General Health) morbid obesity Objective Constitutional Slightly tachycardic, asymptomatic. No acute distress.. Vitals Time Taken: 3:24 PM, Height: 65 in, Weight: 285 lbs, BMI: 47.4, Temperature: 98.6 F, Pulse: 101 bpm, Respiratory Rate: 18 breaths/min, Blood Pressure: 113/77 mmHg. Respiratory Normal work of breathing on room air.. General Notes: 12/15/2021: Her C-section wound wound has continued to contract and fill with good granulation tissue. No odor or  concern for infection. It is very shallow at this point. The wound on her pubis has healed. Integumentary (Hair, Skin) Wound #1 status is Open. Original cause of wound was Surgical Injury. The date acquired was: 10/15/2021. The wound has been in treatment 5 weeks. The wound is located on the Abdomen - Lower Quadrant. The wound measures 0.9cm length x 5.4cm width x 0.6cm depth; 3.817cm^2 area and 2.29cm^3 volume. There is Fat Layer (Subcutaneous Tissue) exposed. There is no tunneling or undermining noted. There is a medium amount of serosanguineous drainage noted. The wound margin is distinct with the outline attached to the wound base. There is large (67-100%) red, friable granulation within the wound bed. There is no necrotic tissue within the wound bed. Wound #2 status is Healed - Epithelialized. Original cause of wound was Shear/Friction. The date acquired was: 10/15/2021. The wound has been in treatment 5 weeks. The wound is located on the Pubis. The wound measures 0cm length x 0cm width x 0cm depth; 0cm^2 area and 0cm^3 volume. There is no tunneling or undermining noted. There is a none present amount of drainage noted. There is no granulation within the wound bed. There is no necrotic tissue within the wound bed. Assessment Active Problems ICD-10 Non-pressure chronic ulcer of skin of other sites with unspecified  severity Disruption of external operation (surgical) wound, not elsewhere classified, sequela Morbid (severe) obesity due to excess calories Essential (primary) hypertension Plan 12/15/2021: Her C-section wound wound has continued to contract and fill with good granulation tissue. No odor or concern for infection. It is very shallow at this point. The wound on her pubis has healed. No debridement was necessary today. At this point, I think the wound is too shallow to continue use of the wound VAC. They will continue to use silver alginate. She will follow-up in 2 weeks. Electronic Signature(s) Signed: 12/15/2021 4:02:47 PM By: Fredirick Maudlin MD FACS Entered By: Fredirick Maudlin on 12/15/2021 16:02:47 -------------------------------------------------------------------------------- HxROS Details Patient Name: Date of Service: Joann [name], [name] 12/15/2021 2:45 PM Medical Record Number: [id number] Patient Account Number: [id number] Date of Birth/Sex: Treating RN: 29-Feb-1981 (41 y.o. [name] Primary Care Provider: PCP, NO Other Clinician: Referring Provider: Treating Provider/Extender: Romilda Garret, Sheronette A Weeks in Treatment: 5 Information Obtained From Patient Chart Constitutional Symptoms (General Health) Medical History: Past Medical History Notes: morbid obesity Eyes Medical History: Negative for: Cataracts; Glaucoma Ear/Nose/Mouth/Throat Medical History: Negative for: Chronic sinus problems/congestion; Middle ear problems Respiratory Medical History: Positive for: Asthma - during pregnancy Cardiovascular Medical History: Positive for: Hypertension Endocrine Medical History: Negative for: Type I Diabetes; Type II Diabetes Genitourinary Medical History: Negative for: End Stage Renal Disease Integumentary (Skin) Medical History: Negative for: History of Burn Oncologic Medical History: Negative for: Received Chemotherapy; Received  Radiation Psychiatric Medical History: Positive for: Confinement Anxiety Negative for: Anorexia/bulimia Immunizations Pneumococcal Vaccine: Received Pneumococcal Vaccination: No Implantable Devices None Hospitalization / Surgery History Type of Hospitalization/Surgery myomectomy hysteroscopy c-section Family and Social History Cancer: No; Diabetes: Yes - Father,Paternal Grandparents; Heart Disease: Yes - Father; Hereditary Spherocytosis: No; Hypertension: Yes - Mother,Father,Maternal Grandparents; Lung Disease: Yes - Mother,Siblings; Seizures: No; Stroke: Yes - Paternal Grandparents; Thyroid Problems: No; Tuberculosis: No; Never smoker; Marital Status - Married; Alcohol Use: Never; Drug Use: No History; Caffeine Use: Daily; Financial Concerns: No; Food, Clothing or Shelter Needs: No; Support System Lacking: No; Transportation Concerns: No Electronic Signature(s) Signed: 12/15/2021 4:21:09 PM By: Fredirick Maudlin MD FACS Signed: 12/15/2021 4:52:29 PM By:  Baruch Gouty RN, BSN Entered By: Fredirick Maudlin on 12/15/2021 15:44:52 -------------------------------------------------------------------------------- SuperBill Details Patient Name: Date of Service: Joann [name], LA TO [place] D. 12/15/2021 Medical Record Number: [id number] Patient Account Number: [id number] Date of Birth/Sex: Treating RN: 07-25-1980 (41 y.o. 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 T81.31XS Disruption of external operation (surgical) wound, not elsewhere classified, sequela E66.01 Morbid (severe) obesity due to excess calories I10 Essential (primary) hypertension Facility Procedures CPT4 Code: [id number] Description: 99213 - WOUND CARE VISIT-LEV 3  EST PT Modifier: Quantity: 1 Physician Procedures : CPT4 Code Description Modifier [id number] - WC PHYS LEVEL 3 - EST PT ICD-10 Diagnosis Description 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

## 2021-12-19 NOTE — Progress Notes (Signed)
Joann [name], Joann [name] ([id number]) Visit Report for 12/15/2021 Arrival Information Details Patient Name: Date of Service: SO LO [place], [name] 12/15/2021 2:45 PM Medical Record Number: [id number] Patient Account Number: [id number] Date of Birth/Sex: Treating RN: August 25, 1980 (41 y.o. [name] Primary Care Amarilys Lyles: PCP, NO Other Clinician: Referring Urie Loughner: Treating Marlea Gambill/Extender: Romilda Garret, Sheronette A Weeks in Treatment: 5 Visit Information History Since Last Visit Added or deleted any medications: Yes Patient Arrived: Ambulatory Any new allergies or adverse reactions: No Arrival Time: 15:22 Had a fall or experienced change in No Accompanied By: spouse activities of daily living that may affect Transfer Assistance: None risk of falls: Patient Identification Verified: Yes Signs or symptoms of abuse/neglect since last visito No Secondary Verification Process Completed: Yes Hospitalized since last visit: No Patient Requires Transmission-Based Precautions: No Implantable device outside of the clinic excluding No Patient Has Alerts: No cellular tissue based products placed in the center since last visit: Has Dressing in Place as Prescribed: No Pain Present Now: No Notes completed antibiotics, pt removed VAC on Thursday, spouse not available to reapply Electronic Signature(s) Signed: 12/15/2021 4:52:29 PM By: Baruch Gouty RN, BSN Entered By: Baruch Gouty on 12/15/2021 15:23:43 -------------------------------------------------------------------------------- Clinic Level of Care Assessment Details Patient Name: Date of Service: [name], [place] TO [place] D. 12/15/2021 2:45 PM Medical Record Number: [id number] Patient Account Number: [id number] Date of Birth/Sex: Treating RN: 21-Dec-1980 (41 y.o. [name] Primary Care Redell Nazir: PCP, NO Other Clinician: Referring Kyndell Zeiser: Treating Iman Orourke/Extender: Romilda Garret, Sheronette A Weeks  in Treatment: 5 Clinic Level of Care Assessment Items TOOL 4 Quantity Score []  - 0 Use when only an EandM is performed on FOLLOW-UP visit ASSESSMENTS - Nursing Assessment / Reassessment X- 1 10 Reassessment of Co-morbidities (includes updates in patient status) X- 1 5 Reassessment of Adherence to Treatment Plan ASSESSMENTS - Wound and Skin A ssessment / Reassessment X - Simple Wound Assessment / Reassessment - one wound 1 5 []  - 0 Complex Wound Assessment / Reassessment - multiple wounds []  - 0 Dermatologic / Skin Assessment (not related to wound area) ASSESSMENTS - Focused Assessment []  - 0 Circumferential Edema Measurements - multi extremities []  - 0 Nutritional Assessment / Counseling / Intervention []  - 0 Lower Extremity Assessment (monofilament, tuning fork, pulses) []  - 0 Peripheral Arterial Disease Assessment (using hand held doppler) ASSESSMENTS - Ostomy and/or Continence Assessment and Care []  - 0 Incontinence Assessment and Management []  - 0 Ostomy Care Assessment and Management (repouching, etc.) PROCESS - Coordination of Care X - Simple Patient / Family Education for ongoing care 1 15 []  - 0 Complex (extensive) Patient / Family Education for ongoing care X- 1 10 Staff obtains Programmer, systems, Records, T Results / Process Orders est []  - 0 Staff telephones HHA, Nursing Homes / Clarify orders / etc []  - 0 Routine Transfer to another Facility (non-emergent condition) []  - 0 Routine Hospital Admission (non-emergent condition) []  - 0 New Admissions / Biomedical engineer / Ordering NPWT Apligraf, etc. , []  - 0 Emergency Hospital Admission (emergent condition) X- 1 10 Simple Discharge Coordination []  - 0 Complex (extensive) Discharge Coordination PROCESS - Special Needs []  - 0 Pediatric / Minor Patient Management []  - 0 Isolation Patient Management []  - 0 Hearing / Language / Visual special needs []  - 0 Assessment of Community assistance (transportation,  D/C planning, etc.) []  - 0 Additional assistance / Altered mentation []  - 0 Support Surface(s) Assessment (bed, cushion, seat, etc.) INTERVENTIONS -  Wound Cleansing / Measurement X - Simple Wound Cleansing - one wound 1 5 $R'[]'VQ$  - 0 Complex Wound Cleansing - multiple wounds X- 1 5 Wound Imaging (photographs - any number of wounds) $RemoveBe'[]'WNvEfbsfS$  - 0 Wound Tracing (instead of photographs) X- 1 5 Simple Wound Measurement - one wound $RemoveB'[]'ziXxcast$  - 0 Complex Wound Measurement - multiple wounds INTERVENTIONS - Wound Dressings X - Small Wound Dressing one or multiple wounds 1 10 $Re'[]'ofh$  - 0 Medium Wound Dressing one or multiple wounds $RemoveBeforeD'[]'LHmCwfcesOidVS$  - 0 Large Wound Dressing one or multiple wounds $RemoveBeforeD'[]'uIygRybMWhhIFz$  - 0 Application of Medications - topical $RemoveB'[]'ZmLgTWQF$  - 0 Application of Medications - injection INTERVENTIONS - Miscellaneous $RemoveBeforeD'[]'PDuRAyaTRRnaGR$  - 0 External ear exam $Remove'[]'MQlxVqA$  - 0 Specimen Collection (cultures, biopsies, blood, body fluids, etc.) $RemoveBefor'[]'XKyNzmgPATRD$  - 0 Specimen(s) / Culture(s) sent or taken to Lab for analysis $RemoveBefo'[]'fZYrdBpkinG$  - 0 Patient Transfer (multiple staff / Civil Service fast streamer / Similar devices) $RemoveBeforeDE'[]'FESpglpwooSFmFu$  - 0 Simple Staple / Suture removal (25 or less) $Remove'[]'JNFIXgW$  - 0 Complex Staple / Suture removal (26 or more) $Remove'[]'KSvSjRr$  - 0 Hypo / Hyperglycemic Management (close monitor of Blood Glucose) $RemoveBefore'[]'nGrZRmmVJlnlG$  - 0 Ankle / Brachial Index (ABI) - do not check if billed separately X- 1 5 Vital Signs Has the patient been seen at the hospital within the last three years: Yes Total Score: 85 Level Of Care: New/Established - Level 3 Electronic Signature(s) Signed: 12/15/2021 4:52:29 PM By: Baruch Gouty RN, BSN Entered By: Baruch Gouty on 12/15/2021 15:48:28 -------------------------------------------------------------------------------- Encounter Discharge Information Details Patient Name: Date of Service: [name], [name] 12/15/2021 2:45 PM Medical Record Number: [id number] Patient Account Number: [id number] Date of Birth/Sex: Treating RN: 04/01/1981 (41 y.o. [name] Primary Care  Britni Driscoll: PCP, NO Other Clinician: Referring Jonee Lamore: Treating Gilmore List/Extender: Romilda Garret, Sheronette A Weeks in Treatment: 5 Encounter Discharge Information Items Discharge Condition: Stable Ambulatory Status: Ambulatory Discharge Destination: Home Transportation: Private Auto Accompanied By: spouse Schedule Follow-up Appointment: Yes Clinical Summary of Care: Patient Declined Electronic Signature(s) Signed: 12/15/2021 4:52:29 PM By: Baruch Gouty RN, BSN Entered By: Baruch Gouty on 12/15/2021 15:56:13 -------------------------------------------------------------------------------- Lower Extremity Assessment Details Patient Name: Date of Service: [name], [place] TO [place] D. 12/15/2021 2:45 PM Medical Record Number: [id number] Patient Account Number: [id number] Date of Birth/Sex: Treating RN: Feb 25, 1981 (41 y.o. [name] Primary Care Jarid Sasso: PCP, NO Other Clinician: Referring Shylah Dossantos: Treating Alysah Carton/Extender: Romilda Garret, Sheronette A Weeks in Treatment: 5 Electronic Signature(s) Signed: 12/15/2021 4:52:29 PM By: Baruch Gouty RN, BSN Entered By: Baruch Gouty on 12/15/2021 15:25:17 -------------------------------------------------------------------------------- Multi Wound Chart Details Patient Name: Date of Service: [name], [name] 12/15/2021 2:45 PM Medical Record Number: [id number] Patient Account Number: [id number] Date of Birth/Sex: Treating RN: 03-Feb-1981 (41 y.o. [name] Primary Care Leiann Sporer: PCP, NO Other Clinician: Referring Ryka Beighley: Treating Dafne Nield/Extender: Romilda Garret, Sheronette A Weeks in Treatment: 5 Vital Signs Height(in): 65 Pulse(bpm): 101 Weight(lbs): 285 Blood Pressure(mmHg): 113/77 Body Mass Index(BMI): 47.4 Temperature(F): 98.6 Respiratory Rate(breaths/min): 18 Photos: [N/A:N/A] Abdomen - Lower Quadrant Pubis N/A Wound Location: Surgical Injury  Shear/Friction N/A Wounding Event: Open Surgical Wound Abrasion N/A Primary Etiology: Asthma, Hypertension, Confinement Asthma, Hypertension, Confinement N/A Comorbid History: Anxiety Anxiety 10/15/2021 10/15/2021 N/A Date Acquired: 5 5 N/A Weeks of Treatment: Open Healed - Epithelialized N/A Wound Status: No No N/A Wound Recurrence: 0.9x5.4x0.6 0x0x0 N/A Measurements L x W x D (cm) 3.817 0 N/A A (cm) : rea 2.29 0 N/A Volume (cm) :  84.80% 100.00% N/A % Reduction in Area: 98.20% 100.00% N/A % Reduction in Volume: Full Thickness Without Exposed Full Thickness Without Exposed N/A Classification: Support Structures Support Structures Medium None Present N/A Exudate Amount: Serosanguineous N/A N/A Exudate Type: red, brown N/A N/A Exudate Color: Distinct, outline attached N/A N/A Wound Margin: Large (67-100%) None Present (0%) N/A Granulation Amount: Red, Friable N/A N/A Granulation Quality: None Present (0%) None Present (0%) N/A Necrotic Amount: Fat Layer (Subcutaneous Tissue): Yes Fascia: No N/A Exposed Structures: Fascia: No Fat Layer (Subcutaneous Tissue): No Tendon: No Tendon: No Muscle: No Muscle: No Joint: No Joint: No Bone: No Bone: No Small (1-33%) Large (67-100%) N/A Epithelialization: Treatment Notes Electronic Signature(s) Signed: 12/15/2021 3:43:48 PM By: Fredirick Maudlin MD FACS Signed: 12/15/2021 4:52:29 PM By: Baruch Gouty RN, BSN Entered By: Fredirick Maudlin on 12/15/2021 15:43:48 -------------------------------------------------------------------------------- Multi-Disciplinary Care Plan Details Patient Name: Date of Service: Arelia Long[place] N, [place] TO [place] D. 12/15/2021 2:45 PM Medical Record Number: [id number] Patient Account Number: [id number] Date of Birth/Sex: Treating RN: June 17, 1980 (41 y.o. [name] Primary Care Nyoka Alcoser: PCP, NO Other Clinician: Referring Deana Krock: Treating Velna Hedgecock/Extender: Micheal Likens in Treatment: 5 Multidisciplinary Care Plan reviewed with physician Active Inactive Wound/Skin Impairment Nursing Diagnoses: Impaired tissue integrity Knowledge deficit related to ulceration/compromised skin integrity Goals: Patient/caregiver will verbalize understanding of skin care regimen Date Initiated: 11/17/2021 Target Resolution Date: 12/29/2021 Goal Status: Active Ulcer/skin breakdown will have a volume reduction of 30% by week 4 Date Initiated: 11/17/2021 Date Inactivated: 12/01/2021 Target Resolution Date: 12/01/2021 Goal Status: Met Ulcer/skin breakdown will have a volume reduction of 50% by week 8 Date Initiated: 12/01/2021 Target Resolution Date: 12/29/2021 Goal Status: Active Interventions: Assess patient/caregiver ability to obtain necessary supplies Assess patient/caregiver ability to perform ulcer/skin care regimen upon admission and as needed Assess ulceration(s) every visit Treatment Activities: Skin care regimen initiated : 11/17/2021 Topical wound management initiated : 11/17/2021 Notes: Electronic Signature(s) Signed: 12/15/2021 4:52:29 PM By: Baruch Gouty RN, BSN Entered By: Baruch Gouty on 12/15/2021 15:34:25 -------------------------------------------------------------------------------- Pain Assessment Details Patient Name: Date of Service: [name], [name] 12/15/2021 2:45 PM Medical Record Number: [id number] Patient Account Number: [id number] Date of Birth/Sex: Treating RN: May 19, 1980 (41 y.o. [name] Primary Care [place] Flett: PCP, NO Other Clinician: Referring Keyasha Miah: Treating Jonay Hitchcock/Extender: Romilda Garret, Sheronette A Weeks in Treatment: 5 Active Problems Location of Pain Severity and Description of Pain Patient Has Paino No Site Locations Rate the pain. Rate the pain. Current Pain Level: 0 Pain Management and Medication Current Pain Management: Electronic Signature(s) Signed: 12/15/2021  4:52:29 PM By: Baruch Gouty RN, BSN Entered By: Baruch Gouty on 12/15/2021 15:25:12 -------------------------------------------------------------------------------- Patient/Caregiver Education Details Patient Name: Date of Service: Milinda [place], [place] TO [place] D. 8/14/2023andnbsp2:45 PM Medical Record Number: [id number] Patient Account Number: [id number] Date of Birth/Gender: Treating RN: 1981-03-04 (41 y.o. [name] Primary Care Physician: PCP, NO Other Clinician: Referring Physician: Treating Physician/Extender: Romilda Garret, Sheronette Janett Billow in Treatment: 5 Education Assessment Education Provided To: Patient Education Topics Provided Wound/Skin Impairment: Methods: Explain/Verbal Responses: Reinforcements needed, State content correctly Electronic Signature(s) Signed: 12/15/2021 4:52:29 PM By: Baruch Gouty RN, BSN Entered By: Baruch Gouty on 12/15/2021 15:34:55 -------------------------------------------------------------------------------- Wound Assessment Details Patient Name: Date of Service: [name], [name] 12/15/2021 2:45 PM Medical Record Number: [id number] Patient Account Number: [id number] Date of Birth/Sex: Treating RN: [birth year] (41 y.o. [name] Primary Care Franck Vinal: PCP, NO  Other Clinician: Referring [place]: Treating Charlotta Lapaglia/Extender: Romilda Garret, Sheronette A Weeks in Treatment: 5 Wound Status Wound Number: 1 Primary Etiology: Open Surgical Wound Wound Location: Abdomen - Lower Quadrant Wound Status: Open Wounding Event: Surgical Injury Comorbid History: Asthma, Hypertension, Confinement Anxiety Date Acquired: 10/15/2021 Weeks Of Treatment: 5 Clustered Wound: No Photos Wound Measurements Length: (cm) 0.9 Width: (cm) 5.4 Depth: (cm) 0.6 Area: (cm) 3.817 Volume: (cm) 2.29 % Reduction in Area: 84.8% % Reduction in Volume: 98.2% Epithelialization: Small (1-33%) Tunneling:  No Undermining: No Wound Description Classification: Full Thickness Without Exposed Support Structures Wound Margin: Distinct, outline attached Exudate Amount: Medium Exudate Type: Serosanguineous Exudate Color: red, brown Foul Odor After Cleansing: No Slough/Fibrino No Wound Bed Granulation Amount: Large (67-100%) Exposed Structure Granulation Quality: Red, Friable Fascia Exposed: No Necrotic Amount: None Present (0%) Fat Layer (Subcutaneous Tissue) Exposed: Yes Tendon Exposed: No Muscle Exposed: No Joint Exposed: No Bone Exposed: No Treatment Notes Wound #1 (Abdomen - Lower Quadrant) Cleanser Peri-Wound Care Topical Primary Dressing KerraCel Ag Gelling Fiber Dressing, 2x2 in (silver alginate) Discharge Instruction: Apply silver alginate to wound bed as instructed Secondary Dressing Woven Gauze Sponge, Non-Sterile 4x4 in Discharge Instruction: Apply over primary dressing rolled to keep skin edges apart Secured With 4M Medipore H Soft Cloth Surgical T ape, 4 x 10 (in/yd) Discharge Instruction: Secure with tape as directed. Compression Wrap Compression Stockings Add-Ons Electronic Signature(s) Signed: 12/15/2021 4:52:29 PM By: Baruch Gouty RN, BSN Entered By: Baruch Gouty on 12/15/2021 15:32:47 -------------------------------------------------------------------------------- Wound Assessment Details Patient Name: Date of Service: [name], [name] 12/15/2021 2:45 PM Medical Record Number: [id number] Patient Account Number: [id number] Date of Birth/Sex: Treating RN: Nov 21, 1980 (41 y.o. [name] Primary Care Rosemaria Inabinet: PCP, NO Other Clinician: Referring Kadarrius Yanke: Treating Elliott Quade/Extender: Romilda Garret, Sheronette A Weeks in Treatment: 5 Wound Status Wound Number: 2 Primary Etiology: Abrasion Wound Location: Pubis Wound Status: Healed - Epithelialized Wounding Event: Shear/Friction Comorbid History: Asthma, Hypertension,  Confinement Anxiety Date Acquired: 10/15/2021 Weeks Of Treatment: 5 Clustered Wound: No Photos Wound Measurements Length: (cm) Width: (cm) Depth: (cm) Area: (cm) Volume: (cm) 0 % Reduction in Area: 100% 0 % Reduction in Volume: 100% 0 Epithelialization: Large (67-100%) 0 Tunneling: No 0 Undermining: No Wound Description Classification: Full Thickness Without Exposed Support Structures Exudate Amount: None Present Foul Odor After Cleansing: No Slough/Fibrino No Wound Bed Granulation Amount: None Present (0%) Exposed Structure Necrotic Amount: None Present (0%) Fascia Exposed: No Fat Layer (Subcutaneous Tissue) Exposed: No Tendon Exposed: No Muscle Exposed: No Joint Exposed: No Bone Exposed: No Electronic Signature(s) Signed: 12/15/2021 4:52:29 PM By: Baruch Gouty RN, BSN Entered By: Baruch Gouty on 12/15/2021 15:33:29 -------------------------------------------------------------------------------- Vitals Details Patient Name: Date of Service: [name], [name] 12/15/2021 2:45 PM Medical Record Number: [id number] Patient Account Number: [id number] Date of Birth/Sex: Treating RN: Oct 17, 1980 (41 y.o. [name] Primary Care Nyhla Mountjoy: PCP, NO Other Clinician: Referring Assia Meanor: Treating Elyjah Hazan/Extender: Romilda Garret, Sheronette A Weeks in Treatment: 5 Vital Signs Time Taken: 15:24 Temperature (F): 98.6 Height (in): 65 Pulse (bpm): 101 Weight (lbs): 285 Respiratory Rate (breaths/min): 18 Body Mass Index (BMI): 47.4 Blood Pressure (mmHg): 113/77 Reference Range: 80 - 120 mg / dl Electronic Signature(s) Signed: 12/15/2021 4:52:29 PM By: Baruch Gouty RN, BSN Entered By: Baruch Gouty on 12/15/2021 15:25:05

## 2021-12-29 ENCOUNTER — Encounter (HOSPITAL_BASED_OUTPATIENT_CLINIC_OR_DEPARTMENT_OTHER): Payer: No Typology Code available for payment source | Admitting: General Surgery

## 2021-12-29 DIAGNOSIS — O900 Disruption of cesarean delivery wound: Secondary | ICD-10-CM | POA: Diagnosis not present

## 2021-12-29 DIAGNOSIS — T8131XA Disruption of external operation (surgical) wound, not elsewhere classified, initial encounter: Secondary | ICD-10-CM | POA: Diagnosis not present

## 2021-12-29 DIAGNOSIS — I10 Essential (primary) hypertension: Secondary | ICD-10-CM | POA: Diagnosis not present

## 2021-12-29 DIAGNOSIS — L98492 Non-pressure chronic ulcer of skin of other sites with fat layer exposed: Secondary | ICD-10-CM | POA: Diagnosis not present

## 2021-12-29 DIAGNOSIS — Z6841 Body Mass Index (BMI) 40.0 and over, adult: Secondary | ICD-10-CM | POA: Diagnosis not present

## 2021-12-30 NOTE — Progress Notes (Signed)
Joann [name], Joann [name] ([id number]) Visit Report for 12/29/2021 Chief Complaint Document Details Patient Name: Date of Service: [name], [name] 12/29/2021 2:45 PM Medical Record Number: [id number] Patient Account Number: [id number] Date of Birth/Sex: Treating RN: April 24, 1981 (41 y.o. Joann [name] Primary Care Provider: PCP, NO Other Clinician: Referring Provider: Treating Provider/Extender: Romilda Garret, Sheronette A Weeks in Treatment: 7 Information Obtained from: Patient Chief Complaint Patient presents to the wound care center with open non-healing surgical wound(s) Electronic Signature(s) Signed: 12/29/2021 3:08:10 PM By: Fredirick Maudlin MD FACS Entered By: Fredirick Maudlin on 12/29/2021 15:08:10 -------------------------------------------------------------------------------- HPI Details Patient Name: Date of Service: Joann [place] N, [name] 12/29/2021 2:45 PM Medical Record Number: [id number] Patient Account Number: [id number] Date of Birth/Sex: Treating RN: August 31, 1980 (41 y.o. Joann [name] Primary Care Provider: PCP, NO Other Clinician: Referring Provider: Treating Provider/Extender: Romilda Garret, Sheronette A Weeks in Treatment: 7 History of Present Illness HPI Description: ADMISSION 11/07/2021 This is a 41 year old morbidly obese woman who had a cesarean section on October 15, 2021. Her wound subsequently became infected and had to be reopened. She was prescribed Bactrim by her OB/GYN for Klebsiella and Proteus that were grown from a culture. She completed this course of antibiotics. She saw her OB/GYN again on July 1. The wound was irrigated and debrided then subsequently packed with calcium alginate. She was referred to the wound clinic for further evaluation and management. The center portion of her cesarean section wound is open. It is about 5 cm deep and there is exposed Vicryl suture. No fascial violation is appreciated. On the right,  there is a tunnel that extends for about 3 cm laterally. There is some fat necrosis and a little bit of slough present but otherwise it is quite clean. 11/17/2021: The wound is smaller and shallower today. There is still some tunneling. The drainage is a little bit cloudy. She has been using calcium alginate to pack the wound. We are working on getting her wound VAC and possibly home health, but this has not yet been approved. 11/24/2021: The wound continues to contract. Is narrower and shallower. There is still some lateral tunneling. She does have her wound VAC with her today. 12/01/2021: She contacted our office midweek due to concerns about odor from the wound. We asked her to remove the wound VAC and just do Dakin's wet-to- dry dressing changes. Apparently, however, the black sponge was not removed from the base of the wound and it continued to fester. She never got the Dakin's solution and just did silver alginate dressing changes. T oday, she continues to have a strong odor coming from her wound. There is hypertrophic granulation tissue along the right lateral portion of the wound as well as at the pubis. The tunneling has more or less closed, however. The periwound is a bit edematous and erythematous. Despite the appearance of potential wound infection, the surface of the open portion of the wound actually looks quite healthy. 12/15/2021: She completed her course of oral Augmentin. Her C-section wound wound has continued to contract and fill with good granulation tissue. No odor or concern for infection. It is very shallow at this point. The wound on her pubis has healed. 12/29/2021: Continued contracture and feeling of the wound. No odor or concern for infection. We are using silver alginate. Electronic Signature(s) Signed: 12/29/2021 3:08:42 PM By: Fredirick Maudlin MD FACS Signed: 12/29/2021 3:08:42 PM By: Fredirick Maudlin MD FACS Entered By: Fredirick Maudlin on  12/29/2021  15:08:42 -------------------------------------------------------------------------------- Physical Exam Details Patient Name: Date of Service: Joann LO [place], [name] 12/29/2021 2:45 PM Medical Record Number: [id number] Patient Account Number: [id number] Date of Birth/Sex: Treating RN: [birth year] (41 y.o. Joann [name] Primary Care Provider: PCP, NO Other Clinician: Referring Provider: Treating Provider/Extender: Romilda Garret, Sheronette A Weeks in Treatment: 7 Constitutional . Slightly tachycardic, asymptomatic. . . No acute distress.Marland Kitchen Respiratory Normal work of breathing on room air.. Notes 12/29/2021: Continued contracture and filling of the wound. No odor or concern for infection. Electronic Signature(s) Signed: 12/29/2021 3:09:28 PM By: Fredirick Maudlin MD FACS Entered By: Fredirick Maudlin on 12/29/2021 15:09:28 -------------------------------------------------------------------------------- Physician Orders Details Patient Name: Date of Service: Joann LO MO N, [name] 12/29/2021 2:45 PM Medical Record Number: [id number] Patient Account Number: [id number] Date of Birth/Sex: Treating RN: May 31, 1980 (41 y.o. 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 - Dr. Celine [name] RM 1 with Joann [name] Return A Mon, 9/11 at 3:30 PM Bathing/ Shower/ Hygiene May shower and wash wound with soap and water. Negative Presssure Wound Therapy Discontinue wound vac - as of  12/15/21 Wound Treatment Wound #1 - Abdomen - Lower Quadrant Prim Dressing: KerraCel Ag Gelling Fiber Dressing, 2x2 in (silver alginate) Every Other Day/30 Days ary Discharge Instructions: Apply silver alginate to wound bed as instructed Secondary Dressing: Woven Gauze Sponge, Non-Sterile 4x4 in Every Other Day/30 Days Discharge Instructions: Apply over primary dressing rolled to keep skin edges apart Secured With: 56M Medipore H Soft Cloth Surgical T ape, 4 x 10 (in/yd) Every Other Day/30 Days Discharge Instructions: Secure with tape as directed. Electronic Signature(s) Signed: 12/29/2021 3:44:00 PM By: Fredirick Maudlin MD FACS Entered By: Fredirick Maudlin on 12/29/2021 15:09:38 -------------------------------------------------------------------------------- Problem List Details Patient Name: Date of Service: Joann LO MO N, [name] 12/29/2021 2:45 PM Medical Record Number: [id number] Patient Account Number: [id number] Date of Birth/Sex: Treating RN: [birth year] (41 y.o. 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 12/29/2021  2:45 PM Medical Record Number: [id number] Patient Account Number: [id number] Date of Birth/Sex: Treating RN: 01-25-1981 (41 y.o. Joann [name] Primary Care Provider: PCP, NO Other Clinician: Referring Provider: Treating Provider/Extender: Romilda Garret, Sheronette A Weeks in Treatment: 7 Subjective Chief Complaint Information obtained from Patient Patient presents to the wound care center with open non-healing surgical wound(s) History of Present Illness (HPI) ADMISSION 11/07/2021 This is a 41 year old morbidly obese woman who had a cesarean section  on October 15, 2021. Her wound subsequently became infected and had to be reopened. She was prescribed Bactrim by her OB/GYN for Klebsiella and Proteus that were grown from a culture. She completed this course of antibiotics. She saw her OB/GYN again on July 1. The wound was irrigated and debrided then subsequently packed with calcium alginate. She was referred to the wound clinic for further evaluation and management. The center portion of her cesarean section wound is open. It is about 5 cm deep and there is exposed Vicryl suture. No fascial violation is appreciated. On the right, there is a tunnel that extends for about 3 cm laterally. There is some fat necrosis and a little bit of slough present but otherwise it is quite clean. 11/17/2021: The wound is smaller and shallower today. There is still some tunneling. The drainage is a little bit cloudy. She has been using calcium alginate to pack the wound. We are working on getting her wound VAC and possibly home health, but this has not yet been approved. 11/24/2021: The wound continues to contract. Is narrower and shallower. There is still some lateral tunneling. She does have her wound VAC with her today. 12/01/2021: She contacted our office midweek due to concerns about odor from the wound. We asked her to remove the wound VAC and just do Dakin's wet-to- dry dressing changes.  Apparently, however, the black sponge was not removed from the base of the wound and it continued to fester. She never got the Dakin's solution and just did silver alginate dressing changes. T oday, she continues to have a strong odor coming from her wound. There is hypertrophic granulation tissue along the right lateral portion of the wound as well as at the pubis. The tunneling has more or less closed, however. The periwound is a bit edematous and erythematous. Despite the appearance of potential wound infection, the surface of the open portion of the wound actually looks quite healthy. 12/15/2021: She completed her course of oral Augmentin. Her C-section wound wound has continued to contract and fill with good granulation tissue. No odor or concern for infection. It is very shallow at this point. The wound on her pubis has healed. 12/29/2021: Continued contracture and feeling of the wound. No odor or concern for infection. We are using silver alginate. Patient History Information obtained from Patient, Chart. Family History Diabetes - Father,Paternal Grandparents, Heart Disease - Father, Hypertension - Mother,Father,Maternal Grandparents, Lung Disease - [place], Stroke - Paternal Grandparents, No family history of Cancer, Hereditary Spherocytosis, Seizures, Thyroid Problems, Tuberculosis. Social History Never smoker, Marital Status - Married, Alcohol Use - Never, Drug Use - No History, Caffeine Use - Daily. Medical History Eyes Denies history of Cataracts, Glaucoma Ear/Nose/Mouth/Throat Denies history of Chronic sinus problems/congestion, Middle ear problems Respiratory Patient has history of Asthma - during pregnancy Cardiovascular Patient has history of Hypertension Endocrine Denies history of Type I Diabetes, Type II Diabetes Genitourinary Denies history of End Stage Renal Disease Integumentary (Skin) Denies history of History of Burn Oncologic Denies history of Received  Chemotherapy, Received Radiation Psychiatric Patient has history of Confinement Anxiety Denies history of Anorexia/bulimia Hospitalization/Surgery History - myomectomy. - hysteroscopy. - c-section. Medical A Surgical History Notes nd Constitutional Symptoms (General Health) morbid obesity Objective Constitutional Slightly tachycardic, asymptomatic. No acute distress.. Vitals Time Taken: 2:39 PM, Height: 65 in, Weight: 285 lbs, BMI: 47.4, Temperature: 99.1 F, Pulse: 109 bpm, Respiratory Rate: 18 breaths/min, Blood Pressure: 119/79 mmHg. Respiratory Normal work of breathing on room air.. General Notes: 12/29/2021:  Continued contracture and filling of the wound. No odor or concern for infection. Integumentary (Hair, Skin) Wound #1 status is Open. Original cause of wound was Surgical Injury. The date acquired was: 10/15/2021. The wound has been in treatment 7 weeks. The wound is located on the Abdomen - Lower Quadrant. The wound measures 0.6cm length x 6cm width x 0.3cm depth; 2.827cm^2 area and 0.848cm^3 volume. There is Fat Layer (Subcutaneous Tissue) exposed. There is no tunneling or undermining noted. There is a medium amount of serosanguineous drainage noted. The wound margin is distinct with the outline attached to the wound base. There is large (67-100%) red, friable granulation within the wound bed. There is no necrotic tissue within the wound bed. Assessment Active Problems ICD-10 Non-pressure chronic ulcer of skin of other sites with unspecified severity Disruption of external operation (surgical) wound, not elsewhere classified, sequela Morbid (severe) obesity due to excess calories Essential (primary) hypertension Plan Follow-up Appointments: Return Appointment in 2 weeks. - Dr. Celine [name] RM 1 with Dolan Amen, 9/11 at 3:30 PM Bathing/ Shower/ Hygiene: May shower and wash wound with soap and water. Negative Presssure Wound Therapy: Discontinue wound vac - as of 12/15/21 WOUND  #1: - Abdomen - Lower Quadrant Wound Laterality: Prim Dressing: KerraCel Ag Gelling Fiber Dressing, 2x2 in (silver alginate) Every Other Day/30 Days ary Discharge Instructions: Apply silver alginate to wound bed as instructed Secondary Dressing: Woven Gauze Sponge, Non-Sterile 4x4 in Every Other Day/30 Days Discharge Instructions: Apply over primary dressing rolled to keep skin edges apart Secured With: 61M Medipore H Soft Cloth Surgical T ape, 4 x 10 (in/yd) Every Other Day/30 Days Discharge Instructions: Secure with tape as directed. 12/29/2021: Continued contracture and filling of the wound. No odor or concern for infection. No debridement was necessary today. We will continue to use silver alginate dressing changes. Follow-up in 2 weeks. Electronic Signature(s) Signed: 12/29/2021 3:09:57 PM By: Fredirick Maudlin MD FACS Entered By: Fredirick Maudlin on 12/29/2021 15:09:57 -------------------------------------------------------------------------------- HxROS Details Patient Name: Date of Service: Joann LO [place] N, [name] 12/29/2021 2:45 PM Medical Record Number: [id number] Patient Account Number: [id number] Date of Birth/Sex: Treating RN: November 18, 1980 (41 y.o. Joann [name] Primary Care Provider: PCP, NO Other Clinician: Referring Provider: Treating Provider/Extender: Romilda Garret, Sheronette A Weeks in Treatment: 7 Information Obtained From Patient Chart Constitutional Symptoms (General Health) Medical History: Past Medical History Notes: morbid obesity Eyes Medical History: Negative for: Cataracts; Glaucoma Ear/Nose/Mouth/Throat Medical History: Negative for: Chronic sinus problems/congestion; Middle ear problems Respiratory Medical History: Positive for: Asthma - during pregnancy Cardiovascular Medical History: Positive for: Hypertension Endocrine Medical History: Negative for: Type I Diabetes; Type II Diabetes Genitourinary Medical History: Negative  for: End Stage Renal Disease Integumentary (Skin) Medical History: Negative for: History of Burn Oncologic Medical History: Negative for: Received Chemotherapy; Received Radiation Psychiatric Medical History: Positive for: Confinement Anxiety Negative for: Anorexia/bulimia Immunizations Pneumococcal Vaccine: Received Pneumococcal Vaccination: No Implantable Devices None Hospitalization / Surgery History Type of Hospitalization/Surgery myomectomy hysteroscopy c-section Family and Social History Cancer: No; Diabetes: Yes - Father,Paternal Grandparents; Heart Disease: Yes - Father; Hereditary Spherocytosis: No; Hypertension: Yes - Mother,Father,Maternal Grandparents; Lung Disease: Yes - Mother,Siblings; Seizures: No; Stroke: Yes - Paternal Grandparents; Thyroid Problems: No; Tuberculosis: No; Never smoker; Marital Status - Married; Alcohol Use: Never; Drug Use: No History; Caffeine Use: Daily; Financial Concerns: No; Food, Clothing or Shelter Needs: No; Support System Lacking: No; Transportation Concerns: No Engineer, maintenance) Signed: 12/29/2021 3:44:00 PM By: Fredirick Maudlin MD FACS Signed: 12/30/2021 6:08:33  PM By: Baruch Gouty RN, BSN Entered By: Fredirick Maudlin on 12/29/2021 15:08:48 -------------------------------------------------------------------------------- SuperBill Details Patient Name: Date of Service: Joann LO [place] N, [place] D. 12/29/2021 Medical Record Number: [id number] Patient Account Number: [id number] Date of Birth/Sex: Treating RN: 01-Dec-1980 (41 y.o. 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 T81.31XS Disruption of external operation (surgical) wound, not elsewhere classified,  sequela E66.01 Morbid (severe) obesity due to excess calories I10 Essential (primary) hypertension Facility Procedures CPT4 Code: [id number] Description: 99213 - WOUND CARE VISIT-LEV 3 EST PT Modifier: Quantity: 1 Physician Procedures : CPT4 Code Description Modifier [id number] - WC PHYS LEVEL 3 - EST PT ICD-10 Diagnosis Description 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

## 2021-12-30 NOTE — Progress Notes (Signed)
Joann [name], PERI ([id number]) Visit Report for 12/29/2021 Arrival Information Details Patient Name: Date of Service: [place], [name] 12/29/2021 2:45 PM Medical Record Number: [id number] Patient Account Number: [id number] Date of Birth/Sex: Treating RN: [birth year] (41 y.o. Joann [name] Primary Care Provider: PCP, NO Other Clinician: Referring Provider: Treating Provider/Extender: Romilda Garret, Sheronette A Weeks in Treatment: 7 Visit Information History Since Last Visit Added or deleted any medications: No Patient Arrived: Ambulatory Any new allergies or adverse reactions: No Arrival Time: 14:37 Had a fall or experienced change in No Accompanied By: self activities of daily living that may affect Transfer Assistance: None risk of falls: Patient Identification Verified: Yes Signs or symptoms of abuse/neglect since last visito No Secondary Verification Process Completed: Yes Hospitalized since last visit: No Patient Requires Transmission-Based Precautions: No Implantable device outside of the clinic excluding No Patient Has Alerts: No cellular tissue based products placed in the center since last visit: Has Dressing in Place as Prescribed: Yes Pain Present Now: No Electronic Signature(s) Signed: 12/29/2021 5:02:03 PM By: Adline Peals Entered By: Adline Peals on 12/29/2021 14:39:35 -------------------------------------------------------------------------------- Clinic Level of Care Assessment Details Patient Name: Date of Service: SO LO [place], [name] 12/29/2021 2:45 PM Medical Record Number: [id number] Patient Account Number: [id number] Date of Birth/Sex: Treating RN: 04/18/1981 (41 y.o. Joann [name] Primary Care Provider: PCP, NO Other Clinician: Referring Provider: Treating Provider/Extender: Romilda Garret, Sheronette A Weeks in Treatment: 7 Clinic Level of Care Assessment Items TOOL 4 Quantity Score X- 1  0 Use when only an EandM is performed on FOLLOW-UP visit ASSESSMENTS - Nursing Assessment / Reassessment X- 1 10 Reassessment of Co-morbidities (includes updates in patient status) X- 1 5 Reassessment of Adherence to Treatment Plan ASSESSMENTS - Wound and Skin A ssessment / Reassessment X - Simple Wound Assessment / Reassessment - one wound 1 5 [] - 0 Complex Wound Assessment / Reassessment - multiple wounds [] - 0 Dermatologic / Skin Assessment (not related to wound area) ASSESSMENTS - Focused Assessment [] - 0 Circumferential Edema Measurements - multi extremities [] - 0 Nutritional Assessment / Counseling / Intervention [] - 0 Lower Extremity Assessment (monofilament, tuning fork, pulses) [] - 0 Peripheral Arterial Disease Assessment (using hand held doppler) ASSESSMENTS - Ostomy and/or Continence Assessment and Care [] - 0 Incontinence Assessment and Management [] - 0 Ostomy Care Assessment and Management (repouching, etc.) PROCESS - Coordination of Care X - Simple Patient / Family Education for ongoing care 1 15 [] - 0 Complex (extensive) Patient / Family Education for ongoing care X- 1 10 Staff obtains Programmer, systems, Records, T Results / Process Orders est [] - 0 Staff telephones HHA, Nursing Homes / Clarify orders / etc [] - 0 Routine Transfer to another Facility (non-emergent condition) [] - 0 Routine Hospital Admission (non-emergent condition) [] - 0 New Admissions / Biomedical engineer / Ordering NPWT Apligraf, etc. , [] - 0 Emergency Hospital Admission (emergent condition) X- 1 10 Simple Discharge Coordination [] - 0 Complex (extensive) Discharge Coordination PROCESS - Special Needs [] - 0 Pediatric / Minor Patient Management [] - 0 Isolation Patient Management [] - 0 Hearing / Language / Visual special needs [] - 0 Assessment of Community assistance (transportation, D/C planning, etc.) [] - 0 Additional assistance / Altered mentation [] -  0 Support Surface(s) Assessment (bed, cushion, seat, etc.) INTERVENTIONS - Wound Cleansing / Measurement X - Simple Wound Cleansing - one wound 1 5 [] -  0 Complex Wound Cleansing - multiple wounds X- 1 5 Wound Imaging (photographs - any number of wounds) [] - 0 Wound Tracing (instead of photographs) X- 1 5 Simple Wound Measurement - one wound [] - 0 Complex Wound Measurement - multiple wounds INTERVENTIONS - Wound Dressings X - Small Wound Dressing one or multiple wounds 1 10 [] - 0 Medium Wound Dressing one or multiple wounds [] - 0 Large Wound Dressing one or multiple wounds [] - 0 Application of Medications - topical [] - 0 Application of Medications - injection INTERVENTIONS - Miscellaneous [] - 0 External ear exam [] - 0 Specimen Collection (cultures, biopsies, blood, body fluids, etc.) [] - 0 Specimen(s) / Culture(s) sent or taken to Lab for analysis [] - 0 Patient Transfer (multiple staff / Civil Service fast streamer / Similar devices) [] - 0 Simple Staple / Suture removal (25 or less) [] - 0 Complex Staple / Suture removal (26 or more) [] - 0 Hypo / Hyperglycemic Management (close monitor of Blood Glucose) [] - 0 Ankle / Brachial Index (ABI) - do not check if billed separately X- 1 5 Vital Signs Has the patient been seen at the hospital within the last three years: Yes Total Score: 85 Level Of Care: New/Established - Level 3 Electronic Signature(s) Signed: 12/29/2021 5:02:03 PM By: Adline Peals Entered By: Adline Peals on 12/29/2021 15:15:10 -------------------------------------------------------------------------------- Encounter Discharge Information Details Patient Name: Date of Service: [place] Joann [name] TO [place] D. 12/29/2021 2:45 PM Medical Record Number: [id number] Patient Account Number: [id number] Date of Birth/Sex: Treating RN: [birth year] (41 y.o. Joann [name] Primary Care Provider: PCP, NO Other Clinician: Referring Provider: Treating  Provider/Extender: Romilda Garret, Sheronette A Weeks in Treatment: 7 Encounter Discharge Information Items Discharge Condition: Stable Ambulatory Status: Ambulatory Discharge Destination: Home Transportation: Private Auto Accompanied By: self Schedule Follow-up Appointment: Yes Clinical Summary of Care: Patient Declined Electronic Signature(s) Signed: 12/29/2021 5:02:03 PM By: Sabas Sous By: Adline Peals on 12/29/2021 15:15:37 -------------------------------------------------------------------------------- Lower Extremity Assessment Details Patient Name: Date of Service: [place] Joann [name] TO [place] D. 12/29/2021 2:45 PM Medical Record Number: [id number] Patient Account Number: [id number] Date of Birth/Sex: Treating RN: June 07, 1980 (41 y.o. Joann [name] Primary Care Provider: PCP, NO Other Clinician: Referring Provider: Treating Provider/Extender: Romilda Garret, Sheronette A Weeks in Treatment: 7 Electronic Signature(s) Signed: 12/29/2021 5:02:03 PM By: Sabas Sous By: Adline Peals on 12/29/2021 14:40:09 -------------------------------------------------------------------------------- Multi Wound Chart Details Patient Name: Date of Service: [place]ann [name]. 12/29/2021 2:45 PM Medical Record Number: [id number] Patient Account Number: [id number] Date of Birth/Sex: Treating RN: [birth year] (41 y.o. [name] Primary Care Provider: PCP, NO Other Clinician: Referring Provider: Treating Provider/Extender: Romilda Garret, Sheronette A Weeks in Treatment: 7 Vital Signs Height(in): 65 Pulse(bpm): 109 Weight(lbs): 285 Blood Pressure(mmHg): 119/79 Body Mass Index(BMI): 47.4 Temperature(F): 99.1 Respiratory Rate(breaths/min): 18 Photos: [N/A:N/A] Abdomen - Lower Quadrant N/A N/A Wound Location: Surgical Injury N/A N/A Wounding Event: Open Surgical Wound N/A N/A Primary Etiology: Asthma,  Hypertension, Confinement N/A N/A Comorbid History: Anxiety 10/15/2021 N/A N/A Date Acquired: 7 N/A N/A Weeks of Treatment: Open N/A N/A Wound Status: No N/A N/A Wound Recurrence: 0.6x6x0.3 N/A N/A Measurements L x W x D (cm) 2.827 N/A N/A A (cm) : rea 0.848 N/A N/A Volume (cm) : 88.80% N/A N/A % Reduction in Area: 99.30% N/A N/A % Reduction in Volume: Full Thickness Without Exposed N/A N/A Classification: Support Structures Medium N/A N/A Exudate  Amount: Serosanguineous N/A N/A Exudate Type: red, brown N/A N/A Exudate Color: Distinct, outline attached N/A N/A Wound Margin: Large (67-100%) N/A N/A Granulation Amount: Red, Friable N/A N/A Granulation Quality: None Present (0%) N/A N/A Necrotic Amount: Fat Layer (Subcutaneous Tissue): Yes N/A N/A Exposed Structures: Fascia: No Tendon: No Muscle: No Joint: No Bone: No Small (1-33%) N/A N/A Epithelialization: Treatment Notes Electronic Signature(s) Signed: 12/29/2021 3:08:05 PM By: Fredirick Maudlin MD FACS Signed: 12/30/2021 6:08:33 PM By: Baruch Gouty RN, BSN Entered By: Fredirick Maudlin on 12/29/2021 15:08:05 -------------------------------------------------------------------------------- Multi-Disciplinary Care Plan Details Patient Name: Date of Service: Joann [name], [name] D. 12/29/2021 2:45 PM Medical Record Number: [id number] Patient Account Number: [id number] Date of Birth/Sex: Treating RN: December 21, 1980 (41 y.o. Joann [name] Primary Care Provider: PCP, NO Other Clinician: Referring Provider: Treating Provider/Extender: Micheal Likens in Treatment: 7 Multidisciplinary Care Plan reviewed with physician Active Inactive Wound/Skin Impairment Nursing Diagnoses: Impaired tissue integrity Knowledge deficit related to ulceration/compromised skin integrity Goals: Patient/caregiver will verbalize understanding of skin care regimen Date Initiated:  11/17/2021 Target Resolution Date: 01/27/2022 Goal Status: Active Ulcer/skin breakdown will have a volume reduction of 30% by week 4 Date Initiated: 11/17/2021 Date Inactivated: 12/01/2021 Target Resolution Date: 12/01/2021 Goal Status: Met Ulcer/skin breakdown will have a volume reduction of 50% by week 8 Date Initiated: 12/01/2021 Target Resolution Date: 01/27/2022 Goal Status: Active Interventions: Assess patient/caregiver ability to obtain necessary supplies Assess patient/caregiver ability to perform ulcer/skin care regimen upon admission and as needed Assess ulceration(s) every visit Treatment Activities: Skin care regimen initiated : 11/17/2021 Topical wound management initiated : 11/17/2021 Notes: Electronic Signature(s) Signed: 12/29/2021 5:02:03 PM By: Adline Peals Entered By: Adline Peals on 12/29/2021 14:45:48 -------------------------------------------------------------------------------- Pain Assessment Details Patient Name: Date of Service: Joann [name], [name] D. 12/29/2021 2:45 PM Medical Record Number: [id number] Patient Account Number: [id number] Date of Birth/Sex: Treating RN: 06-09-1980 (41 y.o. Joann [name] Primary Care Provider: PCP, NO Other Clinician: Referring Provider: Treating Provider/Extender: Romilda Garret, Sheronette A Weeks in Treatment: 7 Active Problems Location of Pain Severity and Description of Pain Patient Has Paino No Site Locations Rate the pain. Rate the pain. Current Pain Level: 0 Pain Management and Medication Current Pain Management: Electronic Signature(s) Signed: 12/29/2021 5:02:03 PM By: Adline Peals Entered By: Adline Peals on 12/29/2021 14:40:07 -------------------------------------------------------------------------------- Patient/Caregiver Education Details Patient Name: Date of Service: Joann [name], [name] D. 8/28/2023andnbsp2:45 PM Medical Record Number: [id number] Patient  Account Number: [id number] Date of Birth/Gender: Treating RN: 1980-09-26 (41 y.o. Joann [name] Primary Care Physician: PCP, NO Other Clinician: Referring Physician: Treating Physician/Extender: Micheal Likens in Treatment: 7 Education Assessment Education Provided To: Patient Education Topics Provided Wound/Skin Impairment: Methods: Explain/Verbal Responses: Reinforcements needed, State content correctly Electronic Signature(s) Signed: 12/29/2021 5:02:03 PM By: Adline Peals Entered By: Adline Peals on 12/29/2021 14:45:58 -------------------------------------------------------------------------------- Wound Assessment Details Patient Name: Date of Service: [place] Joann [name] TO [place] D. 12/29/2021 2:45 PM Medical Record Number: [id number] Patient Account Number: [id number] Date of Birth/Sex: Treating RN: October 15, 1980 (41 y.o. Joann [name] Primary Care Provider: PCP, NO Other Clinician: Referring Provider: Treating Provider/Extender: Romilda Garret, Sheronette A Weeks in Treatment: 7 Wound Status Wound Number: 1 Primary Etiology: Open Surgical Wound Wound Location: Abdomen - Lower Quadrant Wound Status: Open Wounding Event: Surgical Injury Comorbid History: Asthma, Hypertension, Confinement Anxiety Date Acquired: 10/15/2021 Weeks Of Treatment: 7 Clustered Wound: No Photos Wound Measurements Length: (  cm) 0.6 Width: (cm) 6 Depth: (cm) 0.3 Area: (cm) 2.827 Volume: (cm) 0.848 % Reduction in Area: 88.8% % Reduction in Volume: 99.3% Epithelialization: Small (1-33%) Tunneling: No Undermining: No Wound Description Classification: Full Thickness Without Exposed Support Structures Wound Margin: Distinct, outline attached Exudate Amount: Medium Exudate Type: Serosanguineous Exudate Color: red, brown Foul Odor After Cleansing: No Slough/Fibrino No Wound Bed Granulation Amount: Large (67-100%) Exposed  Structure Granulation Quality: Red, Friable Fascia Exposed: No Necrotic Amount: None Present (0%) Fat Layer (Subcutaneous Tissue) Exposed: Yes Tendon Exposed: No Muscle Exposed: No Joint Exposed: No Bone Exposed: No Treatment Notes Wound #1 (Abdomen - Lower Quadrant) Cleanser Peri-Wound Care Topical Primary Dressing KerraCel Ag Gelling Fiber Dressing, 2x2 in (silver alginate) Discharge Instruction: Apply silver alginate to wound bed as instructed Secondary Dressing Woven Gauze Sponge, Non-Sterile 4x4 in Discharge Instruction: Apply over primary dressing rolled to keep skin edges apart Secured With 51M Medipore H Soft Cloth Surgical T ape, 4 x 10 (in/yd) Discharge Instruction: Secure with tape as directed. Compression Wrap Compression Stockings Add-Ons Electronic Signature(s) Signed: 12/29/2021 5:02:03 PM By: Adline Peals Entered By: Adline Peals on 12/29/2021 14:45:23 -------------------------------------------------------------------------------- Vitals Details Patient Name: Date of Service: [place] Joann [name]. 12/29/2021 2:45 PM Medical Record Number: [id number] Patient Account Number: [id number] Date of Birth/Sex: Treating RN: 1980/12/13 (41 y.o. Joann [name] Primary Care Markelle Najarian: PCP, NO Other Clinician: Referring Kehlani Vancamp: Treating Jerrald Doverspike/Extender: Romilda Garret, Sheronette A Weeks in Treatment: 7 Vital Signs Time Taken: 14:39 Temperature (F): 99.1 Height (in): 65 Pulse (bpm): 109 Weight (lbs): 285 Respiratory Rate (breaths/min): 18 Body Mass Index (BMI): 47.4 Blood Pressure (mmHg): 119/79 Reference Range: 80 - 120 mg / dl Electronic Signature(s) Signed: 12/29/2021 5:02:03 PM By: Adline Peals Entered By: Adline Peals on 12/29/2021 14:39:58

## 2022-01-12 ENCOUNTER — Encounter (HOSPITAL_BASED_OUTPATIENT_CLINIC_OR_DEPARTMENT_OTHER): Payer: No Typology Code available for payment source | Attending: General Surgery | Admitting: General Surgery

## 2022-01-12 DIAGNOSIS — T8131XS Disruption of external operation (surgical) wound, not elsewhere classified, sequela: Secondary | ICD-10-CM | POA: Diagnosis not present

## 2022-01-12 DIAGNOSIS — Y838 Other surgical procedures as the cause of abnormal reaction of the patient, or of later complication, without mention of misadventure at the time of the procedure: Secondary | ICD-10-CM | POA: Insufficient documentation

## 2022-01-12 DIAGNOSIS — T8131XA Disruption of external operation (surgical) wound, not elsewhere classified, initial encounter: Secondary | ICD-10-CM | POA: Diagnosis not present

## 2022-01-12 DIAGNOSIS — I10 Essential (primary) hypertension: Secondary | ICD-10-CM | POA: Diagnosis not present

## 2022-01-12 DIAGNOSIS — Z6841 Body Mass Index (BMI) 40.0 and over, adult: Secondary | ICD-10-CM | POA: Diagnosis not present

## 2022-01-12 DIAGNOSIS — L98499 Non-pressure chronic ulcer of skin of other sites with unspecified severity: Secondary | ICD-10-CM | POA: Diagnosis not present

## 2022-01-12 NOTE — Progress Notes (Signed)
Joann [name], Joann [name] ([id number]) Visit Report for 01/12/2022 Chief Complaint Document Details Patient Name: Date of Service: [place], [name] 01/12/2022 3:30 PM Medical Record Number: [id number] Patient Account Number: [id number] Date of Birth/Sex: Treating RN: 11-20-1980 (41 y.o. F) Primary Care Provider: PCP, NO Other Clinician: Referring Provider: Treating Provider/Extender: Romilda Garret, Sheronette A Weeks in Treatment: 9 Information Obtained from: Patient Chief Complaint Patient presents to the wound care center with open non-healing surgical wound(s) Electronic Signature(s) Signed: 01/12/2022 4:14:53 PM By: Fredirick Maudlin MD FACS Entered By: Fredirick Maudlin on 01/12/2022 16:14:53 -------------------------------------------------------------------------------- HPI Details Patient Name: Date of Service: Joann [name], Joann TO YA D. 01/12/2022 3:30 PM Medical Record Number: [id number] Patient Account Number: [id number] Date of Birth/Sex: Treating RN: 04/14/1981 (41 y.o. F) Primary Care Provider: PCP, NO Other Clinician: Referring Provider: Treating Provider/Extender: Romilda Garret, Sheronette A Weeks in Treatment: 9 History of Present Illness HPI Description: ADMISSION 11/07/2021 This is a 41 year old morbidly obese woman who had a cesarean section on October 15, 2021. Her wound subsequently became infected and had to be reopened. She was prescribed Bactrim by her OB/GYN for Klebsiella and Proteus that were grown from a culture. She completed this course of antibiotics. She saw her OB/GYN again on July 1. The wound was irrigated and debrided then subsequently packed with calcium alginate. She was referred to the wound clinic for further evaluation and management. The center portion of her cesarean section wound is open. It is about 5 cm deep and there is exposed Vicryl suture. No fascial violation is appreciated. On the right, there is a tunnel that extends for  about 3 cm laterally. There is some fat necrosis and a little bit of slough present but otherwise it is quite clean. 11/17/2021: The wound is smaller and shallower today. There is still some tunneling. The drainage is a little bit cloudy. She has been using calcium alginate to pack the wound. We are working on getting her wound VAC and possibly home health, but this has not yet been approved. 11/24/2021: The wound continues to contract. Is narrower and shallower. There is still some lateral tunneling. She does have her wound VAC with her today. 12/01/2021: She contacted our office midweek due to concerns about odor from the wound. We asked her to remove the wound VAC and just do Dakin's wet-to- dry dressing changes. Apparently, however, the black sponge was not removed from the base of the wound and it continued to fester. She never got the Dakin's solution and just did silver alginate dressing changes. T oday, she continues to have a strong odor coming from her wound. There is hypertrophic granulation tissue along the right lateral portion of the wound as well as at the pubis. The tunneling has more or less closed, however. The periwound is a bit edematous and erythematous. Despite the appearance of potential wound infection, the surface of the open portion of the wound actually looks quite healthy. 12/15/2021: She completed her course of oral Augmentin. Her C-section wound wound has continued to contract and fill with good granulation tissue. No odor or concern for infection. It is very shallow at this point. The wound on her pubis has healed. 12/29/2021: Continued contracture and feeling of the wound. No odor or concern for infection. We are using silver alginate. 01/12/2022: The wound is a little bit narrower today. There does look like there is some moisture related breakdown in the skin adjacent to the wound. Electronic Signature(s) Signed:  01/12/2022 4:15:24 PM By: Fredirick Maudlin MD FACS Entered By:  Fredirick Maudlin on 01/12/2022 16:15:24 -------------------------------------------------------------------------------- Physical Exam Details Patient Name: Date of Service: Joann [name], Joann TO YA D. 01/12/2022 3:30 PM Medical Record Number: [id number] Patient Account Number: [id number] Date of Birth/Sex: Treating RN: [birth year] (41 y.o. F) Primary Care Provider: PCP, NO Other Clinician: Referring Provider: Treating Provider/Extender: Romilda Garret, Sheronette A Weeks in Treatment: 9 Constitutional . . . . No acute distress.Marland Kitchen Respiratory Normal work of breathing on room air.. Notes 01/12/2022: The wound is a little bit narrower today. There does look like there is some moisture related breakdown in the skin adjacent to the wound. Electronic Signature(s) Signed: 01/12/2022 4:15:53 PM By: Fredirick Maudlin MD FACS Entered By: Fredirick Maudlin on 01/12/2022 16:15:53 -------------------------------------------------------------------------------- Physician Orders Details Patient Name: Date of Service: Joann [name], Joann TO YA D. 01/12/2022 3:30 PM Medical Record Number: [id number] Patient Account Number: [id number] Date of Birth/Sex: Treating RN: Apr 27, 1981 (41 y.o. 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 - Dr. Celine Ahr RM 1 with Vaughan Basta Return A Bathing/ Shower/ Hygiene May shower and wash wound with soap and water. Wound Treatment Wound #1 - Abdomen - Lower  Quadrant Peri-Wound Care: Zinc Oxide Ointment 30g tube Every Other Day/30 Days Discharge Instructions: Apply Zinc Oxide to periwound with each dressing change Prim Dressing: KerraCel Ag Gelling Fiber Dressing, 2x2 in (silver alginate) ary Every Other Day/30 Days Discharge Instructions: Apply silver alginate to wound bed as instructed Secondary Dressing: Woven Gauze Sponge, Non-Sterile 4x4 in Every Other Day/30 Days Discharge Instructions: Apply over primary dressing rolled to keep skin edges apart Secured With: 7M Medipore H Soft Cloth Surgical T ape, 4 x 10 (in/yd) Every Other Day/30 Days Discharge Instructions: Secure with tape as directed. Electronic Signature(s) Signed: 01/12/2022 4:20:32 PM By: Fredirick Maudlin MD FACS Entered By: Fredirick Maudlin on 01/12/2022 16:20:32 -------------------------------------------------------------------------------- Problem List Details Patient Name: Date of Service: Joann [name], Joann TO YA D. 01/12/2022 3:30 PM Medical Record Number: [id number] Patient Account Number: [id number] Date of Birth/Sex: Treating RN: 07/21/1980 (41 y.o. 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 01/12/2022 3:30 PM Medical Record Number: [id number] Patient Account Number: [id number] Date of Birth/Sex: Treating RN: [birth year] (41 y.o. F) Primary Care Provider: Other Clinician: PCP, NO Referring Provider: Treating Provider/Extender: Romilda Garret, Sheronette A Weeks in Treatment: 9 Subjective Chief Complaint Information obtained from Patient Patient presents to the wound care  center with open non-healing surgical wound(s) History of Present Illness (HPI) ADMISSION 11/07/2021 This is a 41 year old morbidly obese woman who had a cesarean section on October 15, 2021. Her wound subsequently became infected and had to be reopened. She was prescribed Bactrim by her OB/GYN for Klebsiella and Proteus that were grown from a culture. She completed this course of antibiotics. She saw her OB/GYN again on July 1. The wound was irrigated and debrided then subsequently packed with calcium alginate. She was referred to the wound clinic for further evaluation and management. The center portion of her cesarean section wound is open. It is about 5 cm deep and there is exposed Vicryl suture. No fascial violation is appreciated. On the right, there is a tunnel that extends for about 3 cm laterally. There is some fat necrosis and a little bit of slough present but otherwise it is quite clean. 11/17/2021: The wound is smaller and shallower today. There is still some tunneling. The drainage is a little bit cloudy. She has been using calcium alginate to pack the wound. We are working on getting her wound VAC and possibly home health, but this has not yet been approved. 11/24/2021: The wound continues to contract. Is narrower and shallower. There is still some lateral tunneling. She does have her wound VAC with her today. 12/01/2021: She  contacted our office midweek due to concerns about odor from the wound. We asked her to remove the wound VAC and just do Dakin's wet-to- dry dressing changes. Apparently, however, the black sponge was not removed from the base of the wound and it continued to fester. She never got the Dakin's solution and just did silver alginate dressing changes. T oday, she continues to have a strong odor coming from her wound. There is hypertrophic granulation tissue along the right lateral portion of the wound as well as at the pubis. The tunneling has more or less closed, however. The periwound is a bit edematous and erythematous. Despite the appearance of potential wound infection, the surface of the open portion of the wound actually looks quite healthy. 12/15/2021: She completed her course of oral Augmentin. Her C-section wound wound has continued to contract and fill with good granulation tissue. No odor or concern for infection. It is very shallow at this point. The wound on her pubis has healed. 12/29/2021: Continued contracture and feeling of the wound. No odor or concern for infection. We are using silver alginate. 01/12/2022: The wound is a little bit narrower today. There does look like there is some moisture related breakdown in the skin adjacent to the wound. Patient History Information obtained from Patient, Chart. Family History Diabetes - Father,Paternal Grandparents, Heart Disease - Father, Hypertension - Mother,Father,Maternal Grandparents, Lung Disease - [place], Stroke - Paternal Grandparents, No family history of Cancer, Hereditary Spherocytosis, Seizures, Thyroid Problems, Tuberculosis. Social History Never smoker, Marital Status - Married, Alcohol Use - Never, Drug Use - No History, Caffeine Use - Daily. Medical History Eyes Denies history of Cataracts, Glaucoma Ear/Nose/Mouth/Throat Denies history of Chronic sinus problems/congestion, Middle ear problems Respiratory Patient has  history of Asthma - during pregnancy Cardiovascular Patient has history of Hypertension Endocrine Denies history of Type I Diabetes, Type II Diabetes Genitourinary Denies history of End Stage Renal Disease Integumentary (Skin) Denies history of History of Burn Oncologic Denies history of Received Chemotherapy, Received Radiation Psychiatric Patient has history of Confinement Anxiety Denies history of Anorexia/bulimia Hospitalization/Surgery History - myomectomy. - hysteroscopy. - c-section. Medical A Surgical History Notes nd Constitutional  Symptoms (General Health) morbid obesity Objective Constitutional No acute distress.. Vitals Time Taken: 3:59 PM, Height: 65 in, Weight: 285 lbs, BMI: 47.4, Temperature: 98.4 F, Pulse: 92 bpm, Respiratory Rate: 18 breaths/min, Blood Pressure: 117/77 mmHg. Respiratory Normal work of breathing on room air.. General Notes: 01/12/2022: The wound is a little bit narrower today. There does look like there is some moisture related breakdown in the skin adjacent to the wound. Integumentary (Hair, Skin) Wound #1 status is Open. Original cause of wound was Surgical Injury. The date acquired was: 10/15/2021. The wound has been in treatment 9 weeks. The wound is located on the Abdomen - Lower Quadrant. The wound measures 0.8cm length x 4cm width x 0.3cm depth; 2.513cm^2 area and 0.754cm^3 volume. There is Fat Layer (Subcutaneous Tissue) exposed. There is no tunneling or undermining noted. There is a small amount of purulent drainage noted. The wound margin is flat and intact. There is large (67-100%) red, friable granulation within the wound bed. There is no necrotic tissue within the wound bed. Assessment Active Problems ICD-10 Non-pressure chronic ulcer of skin of other sites with unspecified severity Disruption of external operation (surgical) wound, not elsewhere classified, sequela Morbid (severe) obesity due to excess calories Essential (primary)  hypertension Plan Follow-up Appointments: Return Appointment in 2 weeks. - Dr. Celine Ahr RM 1 with Royce Macadamia Shower/ Hygiene: May shower and wash wound with soap and water. WOUND #1: - Abdomen - Lower Quadrant Wound Laterality: Peri-Wound Care: Zinc Oxide Ointment 30g tube Every Other Day/30 Days Discharge Instructions: Apply Zinc Oxide to periwound with each dressing change Prim Dressing: KerraCel Ag Gelling Fiber Dressing, 2x2 in (silver alginate) Every Other Day/30 Days ary Discharge Instructions: Apply silver alginate to wound bed as instructed Secondary Dressing: Woven Gauze Sponge, Non-Sterile 4x4 in Every Other Day/30 Days Discharge Instructions: Apply over primary dressing rolled to keep skin edges apart Secured With: 28M Medipore H Soft Cloth Surgical T ape, 4 x 10 (in/yd) Every Other Day/30 Days Discharge Instructions: Secure with tape as directed. 01/12/2022: The wound is a little bit narrower today. There does look like there is some moisture related breakdown in the skin adjacent to the wound. No debridement was necessary. We will continue to use silver alginate. I also recommended that she apply zinc oxide or Desitin to the periwound skin and make sure her gauze covering the silver alginate extends beyond the wound to include those areas. She will follow-up in 2 weeks. Electronic Signature(s) Signed: 01/12/2022 4:21:18 PM By: Fredirick Maudlin MD FACS Entered By: Fredirick Maudlin on 01/12/2022 16:21:18 -------------------------------------------------------------------------------- HxROS Details Patient Name: Date of Service: Joann [name], Joann TO YA D. 01/12/2022 3:30 PM Medical Record Number: [id number] Patient Account Number: [id number] Date of Birth/Sex: Treating RN: [birth year] (41 y.o. F) Primary Care Provider: PCP, NO Other Clinician: Referring Provider: Treating Provider/Extender: Romilda Garret, Sheronette A Weeks in Treatment: 9 Information Obtained  From Patient Chart Constitutional Symptoms (General Health) Medical History: Past Medical History Notes: morbid obesity Eyes Medical History: Negative for: Cataracts; Glaucoma Ear/Nose/Mouth/Throat Medical History: Negative for: Chronic sinus problems/congestion; Middle ear problems Respiratory Medical History: Positive for: Asthma - during pregnancy Cardiovascular Medical History: Positive for: Hypertension Endocrine Medical History: Negative for: Type I Diabetes; Type II Diabetes Genitourinary Medical History: Negative for: End Stage Renal Disease Integumentary (Skin) Medical History: Negative for: History of Burn Oncologic Medical History: Negative for: Received Chemotherapy; Received Radiation Psychiatric Medical History: Positive for: Confinement Anxiety Negative for: Anorexia/bulimia Immunizations Pneumococcal Vaccine: Received Pneumococcal Vaccination: No  Implantable Devices None Hospitalization / Surgery History Type of Hospitalization/Surgery myomectomy hysteroscopy c-section Family and Social History Cancer: No; Diabetes: Yes - Father,Paternal Grandparents; Heart Disease: Yes - Father; Hereditary Spherocytosis: No; Hypertension: Yes - Mother,Father,Maternal Grandparents; Lung Disease: Yes - Mother,Siblings; Seizures: No; Stroke: Yes - Paternal Grandparents; Thyroid Problems: No; Tuberculosis: No; Never smoker; Marital Status - Married; Alcohol Use: Never; Drug Use: No History; Caffeine Use: Daily; Financial Concerns: No; Food, Clothing or Shelter Needs: No; Support System Lacking: No; Transportation Concerns: No Electronic Signature(s) Signed: 01/12/2022 4:36:06 PM By: Fredirick Maudlin MD FACS Entered By: Fredirick Maudlin on 01/12/2022 16:15:30 -------------------------------------------------------------------------------- SuperBill Details Patient Name: Date of Service: Joann [name], Joann TO YA D. 01/12/2022 Medical Record Number: [id number] Patient  Account Number: [id number] Date of Birth/Sex: Treating RN: Aug 06, 1980 (41 y.o. 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 T81.31XS Disruption of external operation (surgical) wound, not elsewhere classified, sequela E66.01 Morbid (severe) obesity due to excess calories I10 Essential (primary) hypertension Facility Procedures CPT4 Code: [id number] Description: 99213 - WOUND CARE VISIT-LEV 3 EST PT Modifier: Quantity: 1 Physician Procedures : CPT4 Code Description Modifier [id number] - WC PHYS LEVEL 3 - EST PT ICD-10 Diagnosis Description 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

## 2022-01-12 NOTE — Progress Notes (Signed)
KRISHNA, HEUER ([id number]) Visit Report for 01/12/2022 Arrival Information Details Patient Name: Date of Service: SO LO [place], [name] 01/12/2022 3:30 PM Medical Record Number: [id number] Patient Account Number: [id number] Date of Birth/Sex: Treating RN: 1981/04/28 (41 y.o. [name] Primary Care Nieve Rojero: PCP, NO Other Clinician: Referring Lashya Passe: Treating Aminta Sakurai/Extender: Romilda Garret, Sheronette A Weeks in Treatment: 9 Visit Information History Since Last Visit Added or deleted any medications: No Patient Arrived: Ambulatory Any new allergies or adverse reactions: No Arrival Time: 15:57 Had a fall or experienced change in No Accompanied By: self activities of daily living that may affect Transfer Assistance: None risk of falls: Patient Identification Verified: Yes Signs or symptoms of abuse/neglect since last visito No Secondary Verification Process Completed: Yes Hospitalized since last visit: No Patient Requires Transmission-Based Precautions: No Implantable device outside of the clinic excluding No Patient Has Alerts: No cellular tissue based products placed in the center since last visit: Has Dressing in Place as Prescribed: Yes Pain Present Now: No Electronic Signature(s) Signed: 01/12/2022 4:51:35 PM By: Baruch Gouty RN, BSN Entered By: Baruch Gouty on 01/12/2022 15:58:37 -------------------------------------------------------------------------------- Clinic Level of Care Assessment Details Patient Name: Date of Service: [name], [place] TO [place] D. 01/12/2022 3:30 PM Medical Record Number: [id number] Patient Account Number: [id number] Date of Birth/Sex: Treating RN: 1980-09-28 (41 y.o. [name] Primary Care Nikyah Lackman: PCP, NO Other Clinician: Referring Yuriy Cui: Treating Abelino Tippin/Extender: Romilda Garret, Sheronette A Weeks in Treatment: 9 Clinic Level of Care Assessment Items TOOL 4 Quantity Score _0  - 0 Use  when only an EandM is performed on FOLLOW-UP visit ASSESSMENTS - Nursing Assessment / Reassessment X- 1 10 Reassessment of Co-morbidities (includes updates in patient status) X- 1 5 Reassessment of Adherence to Treatment Plan ASSESSMENTS - Wound and Skin A ssessment / Reassessment X - Simple Wound Assessment / Reassessment - one wound 1 5 _1  - 0 Complex Wound Assessment / Reassessment - multiple wounds _2  - 0 Dermatologic / Skin Assessment (not related to wound area) ASSESSMENTS - Focused Assessment _3  - 0 Circumferential Edema Measurements - multi extremities _4  - 0 Nutritional Assessment / Counseling / Intervention _5  - 0 Lower Extremity Assessment (monofilament, tuning fork, pulses) _6  - 0 Peripheral Arterial Disease Assessment (using hand held doppler) ASSESSMENTS - Ostomy and/or Continence Assessment and Care _7  - 0 Incontinence Assessment and Management _8  - 0 Ostomy Care Assessment and Management (repouching, etc.) PROCESS - Coordination of Care X - Simple Patient / Family Education for ongoing care 1 15 _9  - 0 Complex (extensive) Patient / Family Education for ongoing care X- 1 10 Staff obtains Programmer, systems, Records, T Results / Process Orders est _10  - 0 Staff telephones HHA, Nursing Homes / Clarify orders / etc _11  - 0 Routine Transfer to another Facility (non-emergent condition) _12  - 0 Routine Hospital Admission (non-emergent condition) _13  - 0 New Admissions / Biomedical engineer / Ordering NPWT Apligraf, etc. , _14  - 0 Emergency Hospital Admission (emergent condition) X- 1 10 Simple Discharge Coordination _15  - 0 Complex (extensive) Discharge Coordination PROCESS - Special Needs _16  - 0 Pediatric / Minor Patient Management _17  - 0 Isolation Patient Management _18  - 0 Hearing / Language / Visual special needs _19  - 0 Assessment of Community assistance (transportation, D/C planning, etc.) _20  - 0 Additional assistance / Altered mentation _21  - 0 Support  Surface(s) Assessment (bed, cushion, seat, etc.) INTERVENTIONS - Wound Cleansing / Measurement X - Simple Wound Cleansing - one wound 1  5 _0  - 0 Complex Wound Cleansing - multiple wounds X- 1 5 Wound Imaging (photographs - any number of wounds) _1  - 0 Wound Tracing (instead of photographs) X- 1 5 Simple Wound Measurement - one wound _2  - 0 Complex Wound Measurement - multiple wounds INTERVENTIONS - Wound Dressings X - Small Wound Dressing one or multiple wounds 1 10 _3  - 0 Medium Wound Dressing one or multiple wounds _4  - 0 Large Wound Dressing one or multiple wounds X- 1 5 Application of Medications - topical <[id number]>_5<\/[id number]>_2  - 0 Application of Medications - injection INTERVENTIONS - Miscellaneous _6  - 0 External ear exam _7  - 0 Specimen Collection (cultures, biopsies, blood, body fluids, etc.) _8  - 0 Specimen(s) / Culture(s) sent or taken to Lab for analysis _9  - 0 Patient Transfer (multiple staff / Civil Service fast streamer / Similar devices) _10  - 0 Simple Staple / Suture removal (25 or less) _11  - 0 Complex Staple / Suture removal (26 or more) _12  - 0 Hypo / Hyperglycemic Management (close monitor of Blood Glucose) _13  - 0 Ankle / Brachial Index (ABI) - do not check if billed separately X- 1 5 Vital Signs Has the patient been seen at the hospital within the last three years: Yes Total Score: 90 Level Of Care: New/Established - Level 3 Electronic Signature(s) Signed: 01/12/2022 4:51:35 PM By: Baruch Gouty RN, BSN Entered By: Baruch Gouty on 01/12/2022 16:19:31 -------------------------------------------------------------------------------- Encounter Discharge Information Details Patient Name: Date of Service: [name], [name] 01/12/2022 3:30 PM Medical Record Number: [id number] Patient Account Number: [id number] Date of Birth/Sex: Treating RN: 1981-03-24 (41 y.o. [name] Primary Care Gianlucas Evenson: PCP, NO Other Clinician: Referring Maximillion Gill: Treating Tyauna Lacaze/Extender:  Romilda Garret, Sheronette A Weeks in Treatment: 9 Encounter Discharge Information Items Discharge Condition: Stable Ambulatory Status: Ambulatory Discharge Destination: Home Transportation: Private Auto Accompanied By: self Schedule Follow-up Appointment: Yes Clinical Summary of Care: Patient Declined Electronic Signature(s) Signed: 01/12/2022 4:51:35 PM By: Baruch Gouty RN, BSN Entered By: Baruch Gouty on 01/12/2022 16:20:24 -------------------------------------------------------------------------------- Lower Extremity Assessment Details Patient Name: Date of Service: [name], [name] 01/12/2022 3:30 PM Medical Record Number: [id number] Patient Account Number: [id number] Date of Birth/Sex: Treating RN: [birth year] (41 y.o. [name] Primary Care Jimesha Rising: PCP, NO Other Clinician: Referring Rossi Burdo: Treating Koua Deeg/Extender: Romilda Garret, Sheronette A Weeks in Treatment: 9 Electronic Signature(s) Signed: 01/12/2022 4:51:35 PM By: Baruch Gouty RN, BSN Entered By: Baruch Gouty on 01/12/2022 16:00:03 -------------------------------------------------------------------------------- Multi Wound Chart Details Patient Name: Date of Service: [name], [name] 01/12/2022 3:30 PM Medical Record Number: [id number] Patient Account Number: [id number] Date of Birth/Sex: Treating RN: 02-Aug-1980 (41 y.o. F) Primary Care Damarious Holtsclaw: PCP, NO Other Clinician: Referring Ahlia Lemanski: Treating Angelica Frandsen/Extender: Romilda Garret, Sheronette A Weeks in Treatment: 9 Vital Signs Height(in): 65 Pulse(bpm): 92 Weight(lbs): 285 Blood Pressure(mmHg): 117/77 Body Mass Index(BMI): 47.4 Temperature(F): 98.4 Respiratory Rate(breaths/min): 18 Photos: [N/A:N/A] Abdomen - Lower Quadrant N/A N/A Wound Location: Surgical Injury N/A N/A Wounding Event: Open Surgical Wound N/A N/A Primary Etiology: Asthma, Hypertension, Confinement N/A  N/A Comorbid History: Anxiety 10/15/2021 N/A N/A Date Acquired: 9 N/A N/A Weeks of Treatment: Open N/A N/A Wound Status: No N/A N/A Wound Recurrence: 0.8x4x0.3 N/A N/A Measurements L x W x D (cm) 2.513 N/A N/A A (cm) : rea 0.754 N/A N/A Volume (cm) : 90.00% N/A N/A % Reduction in Area: 99.40% N/A N/A % Reduction in Volume: Full Thickness Without Exposed N/A N/A  Classification: Support Structures Small N/A N/A Exudate Amount: Purulent N/A N/A Exudate Type: yellow, brown, green N/A N/A Exudate Color: Flat and Intact N/A N/A Wound Margin: Large (67-100%) N/A N/A Granulation Amount: Red, Friable N/A N/A Granulation Quality: None Present (0%) N/A N/A Necrotic Amount: Fat Layer (Subcutaneous Tissue): Yes N/A N/A Exposed Structures: Fascia: No Tendon: No Muscle: No Joint: No Bone: No Small (1-33%) N/A N/A Epithelialization: Treatment Notes Electronic Signature(s) Signed: 01/12/2022 4:14:48 PM By: Fredirick Maudlin MD FACS Entered By: Fredirick Maudlin on 01/12/2022 16:14:48 -------------------------------------------------------------------------------- Multi-Disciplinary Care Plan Details Patient Name: Date of Service: [name], [name] 01/12/2022 3:30 PM Medical Record Number: [id number] Patient Account Number: [id number] Date of Birth/Sex: Treating RN: 1981/04/18 (41 y.o. [name] Primary Care Jessey Stehlin: PCP, NO Other Clinician: Referring Romen Yutzy: Treating Keiasia Christianson/Extender: Micheal Likens in Treatment: [place] reviewed with physician Active Inactive Wound/Skin Impairment Nursing Diagnoses: Impaired tissue integrity Knowledge deficit related to ulceration/compromised skin integrity Goals: Patient/caregiver will verbalize understanding of skin care regimen Date Initiated: 11/17/2021 Target Resolution Date: 01/27/2022 Goal Status: Active Ulcer/skin breakdown will have a volume  reduction of 30% by week 4 Date Initiated: 11/17/2021 Date Inactivated: 12/01/2021 Target Resolution Date: 12/01/2021 Goal Status: Met Ulcer/skin breakdown will have a volume reduction of 50% by week 8 Date Initiated: 12/01/2021 Target Resolution Date: 01/27/2022 Goal Status: Active Interventions: Assess patient/caregiver ability to obtain necessary supplies Assess patient/caregiver ability to perform ulcer/skin care regimen upon admission and as needed Assess ulceration(s) every visit Treatment Activities: Skin care regimen initiated : 11/17/2021 Topical wound management initiated : 11/17/2021 Notes: Electronic Signature(s) Signed: 01/12/2022 4:51:35 PM By: Baruch Gouty RN, BSN Entered By: Baruch Gouty on 01/12/2022 16:07:54 -------------------------------------------------------------------------------- Pain Assessment Details Patient Name: Date of Service: [name], [name] 01/12/2022 3:30 PM Medical Record Number: [id number] Patient Account Number: [id number] Date of Birth/Sex: Treating RN: August 09, 1980 (41 y.o. [name] Primary Care Yesli Vanderhoff: PCP, NO Other Clinician: Referring Heidi Lemay: Treating Berna Gitto/Extender: Romilda Garret, Sheronette A Weeks in Treatment: 9 Active Problems Location of Pain Severity and Description of Pain Patient Has Paino No Site Locations Rate the pain. Rate the pain. Current Pain Level: 0 Pain Management and Medication Current Pain Management: Electronic Signature(s) Signed: 01/12/2022 4:51:35 PM By: Baruch Gouty RN, BSN Entered By: Baruch Gouty on 01/12/2022 15:59:59 -------------------------------------------------------------------------------- Patient/Caregiver Education Details Patient Name: Date of Service: [place], [place] TO [place] D. 9/11/2023andnbsp3:30 PM Medical Record Number: [id number] Patient Account Number: [id number] Date of Birth/Gender: Treating RN: 01/21/1981 (41 y.o. [name] Primary  Care Physician: PCP, NO Other Clinician: Referring Physician: Treating Physician/Extender: Micheal Likens in Treatment: 9 Education Assessment Education Provided To: Patient Education Topics Provided Wound/Skin Impairment: Methods: Explain/Verbal Responses: Reinforcements needed, State content correctly [employer]) Signed: 01/12/2022 4:51:35 PM By: Baruch Gouty RN, BSN Entered By: Baruch Gouty on 01/12/2022 16:08:10 -------------------------------------------------------------------------------- Wound Assessment Details Patient Name: Date of Service: [name], [name] 01/12/2022 3:30 PM Medical Record Number: [id number] Patient Account Number: [id number] Date of Birth/Sex: Treating RN: 04/07/1981 (41 y.o. [name] Primary Care Esthefany Herrig: PCP, NO Other Clinician: Referring Lasasha Brophy: Treating Greyson Riccardi/Extender: Romilda Garret, Sheronette A Weeks in Treatment: 9 Wound Status Wound Number: 1 Primary Etiology: Open Surgical Wound Wound Location: Abdomen - Lower Quadrant Wound Status: Open Wounding Event: Surgical Injury Comorbid History: Asthma, Hypertension, Confinement Anxiety Date Acquired: 10/15/2021 Weeks Of Treatment: 9 Clustered Wound:  No Photos Wound Measurements Length: (cm) 0.8 Width: (cm) 4 Depth: (cm) 0.3 Area: (cm) 2.513 Volume: (cm) 0.754 % Reduction in Area: 90% % Reduction in Volume: 99.4% Epithelialization: Small (1-33%) Tunneling: No Undermining: No Wound Description Classification: Full Thickness Without Exposed Support Structures Wound Margin: Flat and Intact Exudate Amount: Small Exudate Type: Purulent Exudate Color: yellow, brown, green Foul Odor After Cleansing: No Slough/Fibrino No Wound Bed Granulation Amount: Large (67-100%) Exposed Structure Granulation Quality: Red, Friable Fascia Exposed: No Necrotic Amount: None Present (0%) Fat Layer (Subcutaneous Tissue)  Exposed: Yes Tendon Exposed: No Muscle Exposed: No Joint Exposed: No Bone Exposed: No Treatment Notes Wound #1 (Abdomen - Lower Quadrant) Cleanser Peri-Wound Care Zinc Oxide Ointment 30g tube Discharge Instruction: Apply Zinc Oxide to periwound with each dressing change Topical Primary Dressing KerraCel Ag Gelling Fiber Dressing, 2x2 in (silver alginate) Discharge Instruction: Apply silver alginate to wound bed as instructed Secondary Dressing Woven Gauze Sponge, Non-Sterile 4x4 in Discharge Instruction: Apply over primary dressing rolled to keep skin edges apart Secured With 4M Medipore H Soft Cloth Surgical T ape, 4 x 10 (in/yd) Discharge Instruction: Secure with tape as directed. Compression Wrap Compression Stockings Add-Ons Electronic Signature(s) Signed: 01/12/2022 4:51:35 PM By: Baruch Gouty RN, BSN Entered By: Baruch Gouty on 01/12/2022 16:06:11 -------------------------------------------------------------------------------- Vitals Details Patient Name: Date of Service: [name], [name] 01/12/2022 3:30 PM Medical Record Number: [id number] Patient Account Number: [id number] Date of Birth/Sex: Treating RN: 1980-12-04 (41 y.o. [name] Primary Care Chinyere Galiano: PCP, NO Other Clinician: Referring Spike Desilets: Treating Blayze Haen/Extender: Romilda Garret, Sheronette A Weeks in Treatment: 9 Vital Signs Time Taken: 15:59 Temperature (F): 98.4 Height (in): 65 Pulse (bpm): 92 Weight (lbs): 285 Respiratory Rate (breaths/min): 18 Body Mass Index (BMI): 47.4 Blood Pressure (mmHg): 117/77 Reference Range: 80 - 120 mg / dl Electronic Signature(s) Signed: 01/12/2022 4:51:35 PM By: Baruch Gouty RN, BSN Entered By: Baruch Gouty on 01/12/2022 15:59:52

## 2022-01-14 ENCOUNTER — Emergency Department (HOSPITAL_COMMUNITY)
Admission: EM | Admit: 2022-01-14 | Discharge: 2022-01-14 | Disposition: A | Discharge status: Home / Self Care | Payer: No Typology Code available for payment source | Attending: Emergency Medicine | Admitting: Emergency Medicine

## 2022-01-14 ENCOUNTER — Other Ambulatory Visit: Payer: Self-pay

## 2022-01-14 DIAGNOSIS — M25561 Pain in right knee: Secondary | ICD-10-CM | POA: Diagnosis not present

## 2022-01-14 DIAGNOSIS — M5441 Lumbago with sciatica, right side: Secondary | ICD-10-CM | POA: Diagnosis not present

## 2022-01-14 DIAGNOSIS — M79661 Pain in right lower leg: Secondary | ICD-10-CM | POA: Diagnosis present

## 2022-01-14 DIAGNOSIS — M5431 Sciatica, right side: Secondary | ICD-10-CM | POA: Diagnosis not present

## 2022-01-14 MED ORDER — NAPROXEN 500 MG PO TABS
500.0000 mg | ORAL_TABLET | Freq: Two times a day (BID) | ORAL | 0 refills | Status: DC
Start: 2022-01-14 — End: 2022-08-24

## 2022-01-14 MED ORDER — KETOROLAC TROMETHAMINE 60 MG/2ML IM SOLN
60.0000 mg | Freq: Once | INTRAMUSCULAR | Status: AC
Start: 2022-01-14 — End: 2022-01-14
  Administered 2022-01-14: 60 mg via INTRAMUSCULAR
  Filled 2022-01-14: qty 2

## 2022-01-14 MED ORDER — METHYLPREDNISOLONE 4 MG PO TBPK: ORAL_TABLET | ORAL | 0 refills | Status: DC

## 2022-01-14 NOTE — ED Notes (Signed)
Discharge instructions reviewed with patient. Patient denies any questions or concerns. Pt out to lobby via wheelchair.

## 2022-01-14 NOTE — Discharge Instructions (Addendum)
You were evaluated in the Emergency Department and after careful evaluation, we did not find any emergent condition requiring admission or further testing in the hospital.  Please take the anti-inflammatories as directed for 7 days.  Also take the Medrol Dosepak as directed until finished.  You may buy Voltaren gel over-the-counter for your knee pain.  I recommend following up with your primary care doctor if your symptoms persist as you could probably benefit from further physical therapy.  There is a phone number in your discharge paperwork on how to establish with 1.  Please return to the Emergency Department if you experience any worsening of your condition. Thank you for allowing [place] to be a part of your care.

## 2022-01-14 NOTE — ED Triage Notes (Signed)
Pt. Stated, I started having right leg pain yesterday, its worse when I lying down.

## 2022-01-14 NOTE — ED Provider Notes (Signed)
[facility] EMERGENCY DEPARTMENT Provider Note   CSN: [id number] Arrival date & time: 01/14/22  7846     History  Chief Complaint  Patient presents with   Leg Pain    Joann [name] is a 41 y.o. female.  HPI 41 year old female with a history of anxiety, fibroids presents to the ER with complaints of pain radiating down her right leg which started yesterday.  Patient denies any injuries or falls.  Patient states that she has pain that goes from her hip down the back of her right leg.  She also complains of ongoing knee pain.  She denies any loss of bowel or bladder control.  She states that she had a bowel movement yesterday.  She took Aleve for pain with little relief.  Denies any foot drop.  No history of IV drug use.  She had a baby in June and states that she has been doing a lot of sitting which she suspects is exacerbating her symptoms.    Home Medications Prior to Admission medications   Medication Sig Start Date End Date Taking? Authorizing Provider  methylPREDNISolone (MEDROL DOSEPAK) 4 MG TBPK tablet Take as directed until finished 01/14/22  Yes Akeela Busk A, PA-C  naproxen (NAPROSYN) 500 MG tablet Take 1 tablet (500 mg total) by mouth 2 (two) times daily. 01/14/22  Yes Garald Balding, PA-C  albuterol (VENTOLIN HFA) 108 (90 Base) MCG/ACT inhaler Inhale 2 puffs into the lungs every 4 (four) hours as needed for shortness of breath or cough. 08/23/21   [provider]  Cholecalciferol (VITAMIN D) 50 MCG (2000 UT) CAPS Take 4,000 Units by mouth daily. Gummy    [provider]  ferrous sulfate 325 (65 FE) MG tablet Take 195 mg by mouth daily with breakfast.    [provider]  folic acid (FOLVITE) 962 MCG tablet Take 800 mcg by mouth daily.    [provider]  ibuprofen (ADVIL) 600 MG tablet Take 1 tablet (600 mg total) by mouth every 6 (six) hours as needed. 10/18/21   Servando Salina, MD  loratadine (CLARITIN) 10 MG  tablet Take 10 mg by mouth daily.    [provider]  Magnesium Oxide 250 MG TABS Take 250 mg by mouth daily.    [provider]  NIFEdipine (ADALAT CC) 30 MG 24 hr tablet Take 1 tablet (30 mg total) by mouth daily. 10/18/21   Servando Salina, MD  Omega-3 Fatty Acids (FISH OIL) 1200 MG CPDR Take 1,200 mg by mouth daily.    [provider]  Prenatal Vit-Fe Fumarate-FA (MULTIVITAMIN-PRENATAL) 27-0.8 MG TABS tablet Take 1 tablet by mouth daily at 12 noon. Dha and Folic Acid    [provider]      Allergies    Patient has no allergy information on record.    Review of Systems   Review of Systems Ten systems reviewed and are negative for acute change, except as noted in the HPI.   Physical Exam Updated Vital Signs BP (!) 132/93 (BP Location: Right Arm)   Pulse 91   Temp 98.5 F (36.9 C) (Oral)   Resp 15   Ht '5\' 5"'$  (1.651 m)   Wt 133.8 kg   LMP 12/22/2021   SpO2 96%   BMI 49.09 kg/m  Physical Exam Vitals and nursing note reviewed.  Constitutional:      General: She is not in acute distress.    Appearance: She is well-developed.  HENT:  Head: Normocephalic and atraumatic.  Eyes:     Conjunctiva/sclera: Conjunctivae normal.  Cardiovascular:     Rate and Rhythm: Normal rate and regular rhythm.     Heart sounds: No murmur heard. Pulmonary:     Effort: Pulmonary effort is normal. No respiratory distress.     Breath sounds: Normal breath sounds.  Abdominal:     Palpations: Abdomen is soft.     Tenderness: There is no abdominal tenderness.  Musculoskeletal:        General: No swelling.     Cervical back: Neck supple.     Comments: No midline tenderness to the C, T, L-spine.  Mild tenderness over the SI joint.  5/5 strength in upper and lower extremities bilaterally.  Sensations are intact.  Able to ambulate.  Full flexion extension of the right knee, no overlying erythema, warmth, drainage.  Skin:    General: Skin is warm and dry.      Capillary Refill: Capillary refill takes less than 2 seconds.  Neurological:     General: No focal deficit present.     Mental Status: She is alert and oriented to person, place, and time.     Sensory: No sensory deficit.     Motor: No weakness.  Psychiatric:        Mood and Affect: Mood normal.     ED Results / Procedures / Treatments   Labs (all labs ordered are listed, but only abnormal results are displayed) Labs Reviewed - No data to display  EKG None  Radiology No results found.  Procedures Procedures    Medications Ordered in ED Medications  ketorolac (TORADOL) injection 60 mg (60 mg Intramuscular Given 01/14/22 8469)    ED Course/ Medical Decision Making/ A&P                           Medical Decision Making Risk Prescription drug management.   Normal neurological exam, no evidence of urinary incontinence or retention, pain is consistently reproducible. There is no evidence of AAA or concern for dissection at this time.   Patient can walk but states is painful.  No loss of bowel or bladder control.  No concern for cauda equina.  No fever, night sweats, weight loss, h/o cancer, IVDU.  She was given Toradol here in the ER, will prescribe course of steroids for suspected sciatica and will give naproxen.  I also recommended Voltaren gel for her knee.  I have low suspicion for a septic joint or fractures.  I did offer a x-ray of her back and knee however the patient declined and states that she would rather just try to treat it first instead.  I think this is reasonable.  Discussed reasons to return immediately to the ER.  Recommended PCP follow-up and possible PT referral.  Patient expresses understanding and agrees with plan.  Final Clinical Impression(s) / ED Diagnoses Final diagnoses:  Sciatica of right side  Acute pain of right knee    Rx / DC Orders ED Discharge Orders          Ordered    methylPREDNISolone (MEDROL DOSEPAK) 4 MG TBPK tablet        01/14/22  0823    naproxen (NAPROSYN) 500 MG tablet  2 times daily        01/14/22 6295              Garald Balding, PA-C 01/14/22 0825    Georgina Snell  C, MD 01/14/22 1928

## 2022-01-26 ENCOUNTER — Encounter (HOSPITAL_BASED_OUTPATIENT_CLINIC_OR_DEPARTMENT_OTHER): Payer: No Typology Code available for payment source | Admitting: General Surgery

## 2022-02-05 DIAGNOSIS — L708 Other acne: Secondary | ICD-10-CM | POA: Diagnosis not present

## 2022-02-05 DIAGNOSIS — L680 Hirsutism: Secondary | ICD-10-CM | POA: Diagnosis not present

## 2022-03-05 ENCOUNTER — Encounter: Payer: Self-pay | Admitting: Nurse Practitioner

## 2022-03-05 ENCOUNTER — Ambulatory Visit (INDEPENDENT_AMBULATORY_CARE_PROVIDER_SITE_OTHER): Payer: No Typology Code available for payment source | Admitting: Nurse Practitioner

## 2022-03-05 VITALS — BP 132/80 | HR 91 | Ht 63.75 in | Wt 293.0 lb

## 2022-03-05 DIAGNOSIS — L680 Hirsutism: Secondary | ICD-10-CM | POA: Diagnosis not present

## 2022-03-05 DIAGNOSIS — Z113 Encounter for screening for infections with a predominantly sexual mode of transmission: Secondary | ICD-10-CM | POA: Diagnosis not present

## 2022-03-05 DIAGNOSIS — O139 Gestational [pregnancy-induced] hypertension without significant proteinuria, unspecified trimester: Secondary | ICD-10-CM | POA: Diagnosis not present

## 2022-03-05 DIAGNOSIS — Z01419 Encounter for gynecological examination (general) (routine) without abnormal findings: Secondary | ICD-10-CM | POA: Diagnosis not present

## 2022-03-05 NOTE — Progress Notes (Signed)
Joann [name] Jun 18, 1980 [id number]   History:  41 y.o. G1P1001 presents for annual exam without GYN complaints. Monthly cycles. Normal pap history. History of primary infertility, successful pregnancy with IVF. Delivery by C-section in June. Developed post-op wound infection, no concerns today. H/O myomectomies and polypectomies with most recent 08/2019. Complains of facial hair that is not new for her. She is seeing dermatology now, started on doxycyline and spironolactone (has not start spironolactone due to worry about BP dropping). Pregnancy-induced hypertension, previously managed by OB.   Gynecologic History Patient's last menstrual period was 02/20/2022 (exact date). Period Cycle (Days): -2 Period Duration (Days): 4-5 Menstrual Flow:  (light to moderate) Menstrual Control: Maxi pad Dysmenorrhea: (!) Mild (mild to moderate) Dysmenorrhea Symptoms: Cramping Contraception/Family planning: none Sexually active: Yes, STD screening  Health Maintenance Last Pap: 07/05/2019. Results were: Normal neg HPV Last mammogram: 04/2021. Results were: Normal per patient Last colonoscopy: Not indicated Last Dexa:  Not indicated  Past medical history, past surgical history, family history and social history were all reviewed and documented in the EPIC chart. Married. Works remote for [employer]. 4 mo daughter.   ROS:  A ROS was performed and pertinent positives and negatives are included.  Exam:  Vitals:   03/05/22 1614  BP: 132/80  Pulse: 91  SpO2: 91%    There is no height or weight on file to calculate BMI.  General appearance:  Normal Thyroid:  Symmetrical, normal in size, without palpable masses or nodularity. Respiratory  Auscultation:  Clear without wheezing or rhonchi Cardiovascular  Auscultation:  Regular rate, without rubs, murmurs or gallops  Edema/varicosities:  Not grossly evident Abdominal  Soft,nontender, without masses, guarding or rebound.  Liver/spleen:  No  organomegaly noted  Hernia:  None appreciated  Skin  Inspection:  Grossly normal. Hair along chin/jaw line, acne vulgaris. Well-healed C-section incision Breasts: Examined lying and sitting.   Right: Without masses, retractions, discharge or axillary adenopathy.   Left: Without masses, retractions, discharge or axillary adenopathy. Genitourinary   Inguinal/mons:  Normal without inguinal adenopathy  External genitalia:  Normal appearing vulva with no masses, tenderness, or lesions  BUS/Urethra/Skene's glands:  Normal  Vagina:  Normal appearing with normal color and discharge, no lesions  Cervix:  Normal appearing without discharge or lesions  Uterus:  Difficult to palpate due to body habitus but no gross masses or tenderness  Adnexa/parametria:     Rt: Normal in size, without masses or tenderness.   Lt: Normal in size, without masses or tenderness.  Anus and perineum: Normal  Digital rectal exam: Not indicated  Patient informed chaperone available to be present for breast and pelvic exam. Patient has requested no chaperone to be present. Patient has been advised what will be completed during breast and pelvic exam.   Assessment/Plan:  41 y.o. G1P1001 for annual exam.   Well female exam with routine gynecological exam - Plan: CBC with Differential/Platelet, Comprehensive metabolic panel. Education provided on SBEs, importance of preventative screenings, current guidelines, high calcium diet, regular exercise, and multivitamin daily.   Hirsutism - Plan: Testos,Total,Free and SHBG (Female). Will check testosterone panel today. Discussed management with spironolactone prescribed by dermatology. Recommend monitoring BP while taking. She has follow up with derm next month.   Screening examination for STD (sexually transmitted disease) - Plan: C. trachomatis/N. gonorrhoeae RNA, RPR, HIV Antibody (routine testing w rflx)  Pregnancy induced hypertension, antepartum - Recommend PCP for  management. Local provider list given to patient.   Screening for cervical cancer -  Normal Pap history.  Will repeat at 5-year interval per guidelines.  Screening for breast cancer - Normal mammogram last December. Continue annual screenings. Normal breast exam today.  Return in 1 year for annual.    Tamela Gammon DNP, 4:41 PM 03/05/2022

## 2022-03-06 LAB — C. TRACHOMATIS/N. GONORRHOEAE RNA
C. trachomatis RNA, TMA: NOT DETECTED
N. gonorrhoeae RNA, TMA: NOT DETECTED

## 2022-03-10 ENCOUNTER — Encounter: Payer: Self-pay | Admitting: Nurse Practitioner

## 2022-03-10 LAB — CBC WITH DIFFERENTIAL/PLATELET
Absolute Monocytes: 462 cells/uL (ref 200–950)
Basophils Absolute: 60 cells/uL (ref 0–200)
Basophils Relative: 1 %
Eosinophils Absolute: 162 cells/uL (ref 15–500)
Eosinophils Relative: 2.7 %
HCT: 40.5 % (ref 35.0–45.0)
Hemoglobin: 13.3 g/dL (ref 11.7–15.5)
Lymphs Abs: 2412 cells/uL (ref 850–3900)
MCH: 26.1 pg — ABNORMAL LOW (ref 27.0–33.0)
MCHC: 32.8 g/dL (ref 32.0–36.0)
MCV: 79.6 fL — ABNORMAL LOW (ref 80.0–100.0)
MPV: 11.6 fL (ref 7.5–12.5)
Monocytes Relative: 7.7 %
Neutro Abs: 2904 cells/uL (ref 1500–7800)
Neutrophils Relative %: 48.4 %
Platelets: 329 10*3/uL (ref 140–400)
RBC: 5.09 10*6/uL (ref 3.80–5.10)
RDW: 14.4 % (ref 11.0–15.0)
Total Lymphocyte: 40.2 %
WBC: 6 10*3/uL (ref 3.8–10.8)

## 2022-03-10 LAB — COMPREHENSIVE METABOLIC PANEL
AG Ratio: 1.2 (calc) (ref 1.0–2.5)
ALT: 18 U/L (ref 6–29)
AST: 14 U/L (ref 10–30)
Albumin: 4 g/dL (ref 3.6–5.1)
Alkaline phosphatase (APISO): 90 U/L (ref 31–125)
BUN: 10 mg/dL (ref 7–25)
CO2: 23 mmol/L (ref 20–32)
Calcium: 9.2 mg/dL (ref 8.6–10.2)
Chloride: 106 mmol/L (ref 98–110)
Creat: 0.67 mg/dL (ref 0.50–0.99)
Globulin: 3.4 g/dL (calc) (ref 1.9–3.7)
Glucose, Bld: 87 mg/dL (ref 65–99)
Potassium: 4.1 mmol/L (ref 3.5–5.3)
Sodium: 139 mmol/L (ref 135–146)
Total Bilirubin: 0.2 mg/dL (ref 0.2–1.2)
Total Protein: 7.4 g/dL (ref 6.1–8.1)

## 2022-03-10 LAB — RPR: RPR Ser Ql: NONREACTIVE

## 2022-03-10 LAB — TESTOS,TOTAL,FREE AND SHBG (FEMALE)
Free Testosterone: 3.7 pg/mL (ref 0.1–6.4)
Sex Hormone Binding: 46.3 nmol/L (ref 17–124)
Testosterone, Total, LC-MS-MS: 41 ng/dL (ref 2–45)

## 2022-03-10 LAB — HIV ANTIBODY (ROUTINE TESTING W REFLEX): HIV 1&2 Ab, 4th Generation: NONREACTIVE

## 2022-04-09 DIAGNOSIS — L700 Acne vulgaris: Secondary | ICD-10-CM | POA: Diagnosis not present

## 2022-04-09 DIAGNOSIS — L708 Other acne: Secondary | ICD-10-CM | POA: Diagnosis not present

## 2022-05-01 DIAGNOSIS — Z1231 Encounter for screening mammogram for malignant neoplasm of breast: Secondary | ICD-10-CM | POA: Diagnosis not present

## 2022-05-07 ENCOUNTER — Other Ambulatory Visit: Payer: Self-pay

## 2022-05-07 ENCOUNTER — Encounter: Payer: Self-pay | Admitting: Nurse Practitioner

## 2022-05-07 DIAGNOSIS — N979 Female infertility, unspecified: Secondary | ICD-10-CM

## 2022-05-07 DIAGNOSIS — Z113 Encounter for screening for infections with a predominantly sexual mode of transmission: Secondary | ICD-10-CM

## 2022-05-07 NOTE — Telephone Encounter (Signed)
Great, thank you!

## 2022-05-07 NOTE — Telephone Encounter (Signed)
Patient has been scheduled for [facility], endo bx and all the labs for 06/16/22 with Dr. Marguerita Merles.

## 2022-05-07 NOTE — Telephone Encounter (Addendum)
Will the diagnosis for all these orders be "infertility"?  Also, what lab work for "basic lab work" should I order?

## 2022-05-07 NOTE — Telephone Encounter (Signed)
After speaking with Dr. Dellis Filbert we are OK to go ahead with lab work, [facility] and EMB. Hysteroscopy only indicated if abnormal EMB or SHGM.

## 2022-05-07 NOTE — Telephone Encounter (Signed)
For the procedures primary infertility is fine. Looks like they included ICD-10 codes for all the blood work that they recommend using. She had HIV, RPR, gonorrhea, chlamydia, and CBC in November when seen for her annual visit.  So she can just do a lab visit for the others unless they need more recent than that.

## 2022-05-15 DIAGNOSIS — R928 Other abnormal and inconclusive findings on diagnostic imaging of breast: Secondary | ICD-10-CM | POA: Diagnosis not present

## 2022-05-15 DIAGNOSIS — R922 Inconclusive mammogram: Secondary | ICD-10-CM | POA: Diagnosis not present

## 2022-05-18 ENCOUNTER — Ambulatory Visit: Payer: No Typology Code available for payment source | Admitting: Nurse Practitioner

## 2022-05-19 ENCOUNTER — Encounter: Payer: Self-pay | Admitting: Obstetrics & Gynecology

## 2022-06-16 ENCOUNTER — Other Ambulatory Visit: Payer: No Typology Code available for payment source | Admitting: Obstetrics & Gynecology

## 2022-06-16 ENCOUNTER — Other Ambulatory Visit: Payer: No Typology Code available for payment source

## 2022-06-22 DIAGNOSIS — Z131 Encounter for screening for diabetes mellitus: Secondary | ICD-10-CM | POA: Diagnosis not present

## 2022-06-22 DIAGNOSIS — I10 Essential (primary) hypertension: Secondary | ICD-10-CM | POA: Diagnosis not present

## 2022-06-22 DIAGNOSIS — Z13220 Encounter for screening for lipoid disorders: Secondary | ICD-10-CM | POA: Diagnosis not present

## 2022-07-01 ENCOUNTER — Ambulatory Visit: Payer: No Typology Code available for payment source | Admitting: Nurse Practitioner

## 2022-07-17 DIAGNOSIS — N978 Female infertility of other origin: Secondary | ICD-10-CM | POA: Diagnosis not present

## 2022-07-20 ENCOUNTER — Telehealth: Payer: Self-pay

## 2022-07-20 DIAGNOSIS — N979 Female infertility, unspecified: Secondary | ICD-10-CM

## 2022-07-20 NOTE — Telephone Encounter (Signed)
Called returned to patient. Patient spoke with [facility]. They advised they will not schedule any outside monitoring. Patient states she will plan to return to current infertility provider with next menses. Patient appreciative of f/u. Patient asking what is still needed that she can schedule with Tiffany? Advised I will review with Tiffany and return call on 3/19, patient agreeable. Will keep appt as scheduled until return call.    Tiffany -will patient need a PUS with EMB? If so, can this be scheduled with you or Dr. Dellis Filbert?

## 2022-07-20 NOTE — Telephone Encounter (Addendum)
Is that type u/s referred to as anything other that what we routinely order?  Follicle study?

## 2022-07-20 NOTE — Telephone Encounter (Signed)
Yes, OK to order at other locations. Thank you.

## 2022-07-20 NOTE — Telephone Encounter (Signed)
Yes, will need to be follicle study. Need to make sure wherever she goes has experience in these and depending on follicle size she may need another one in the next few days and that may not be possible due to scheduling issues. Really is best done by fertility office to have access to these tests.

## 2022-07-20 NOTE — Telephone Encounter (Signed)
Call placed to [facility] scheduling. Was advised follicular ultrasound is not a service offered at either location. Patient notified. Patient request to proceed with referral to [facility]. Advised I will review plan of care with Tiffany and return call. Patient verbalizes understanding and is agreeable.   Call placed to [facility], spoke with [facility]. Advised to send referral, may be able to schedule ultrasound within the next day or two. Order placed, Bianca notified to send referral.   Reviewed plan of care with Tiffany, NP.  Refer to [facility]. Can plan labs and ultrasound with [facility].   Call returned to patient, left message to return call to [place], RN at [phone] OPT 5.

## 2022-07-20 NOTE — Telephone Encounter (Signed)
Patient was scheduled on Thursday for [facility] for her infertility workup for IVF. She called today to say she no longer needs that as they did it at the Infertility office.  She said they were on their way back on Sunday when they got a call from infertility MD saying she needed to have u/s tomorrow to check on where she is with ovulation to be able to plan for transfer.  Out office has no openings tomorrow and I warned her [place] likely will not have opening either unless cancellation.  Okay to place order for gyn u/s for tomorrow and try to schedule her at [facility] or [facility]?

## 2022-07-21 DIAGNOSIS — Z113 Encounter for screening for infections with a predominantly sexual mode of transmission: Secondary | ICD-10-CM | POA: Diagnosis not present

## 2022-07-21 DIAGNOSIS — Z0000 Encounter for general adult medical examination without abnormal findings: Secondary | ICD-10-CM | POA: Diagnosis not present

## 2022-07-21 NOTE — Telephone Encounter (Signed)
If her fertility specialist is still recommending these then yes she can keep this appointment with Dr. Dellis Filbert. Dr. Dellis Filbert has agreed to see her for this. Originally she requested [facility] but now says she does not need. If she can confirm this with her fertility specialist we can adjust as needed. She may just need EMB.

## 2022-07-21 NOTE — Telephone Encounter (Signed)
Call returned to patient, left detailed message, ok per dpr. Advised per Jonelle Sidle, NP. Return call to advise how you would like to proceed. Left message to call Sharee Pimple, RN at [place], [phone], OPT 5.

## 2022-07-22 DIAGNOSIS — Z0000 Encounter for general adult medical examination without abnormal findings: Secondary | ICD-10-CM | POA: Diagnosis not present

## 2022-07-22 DIAGNOSIS — Z113 Encounter for screening for infections with a predominantly sexual mode of transmission: Secondary | ICD-10-CM | POA: Diagnosis not present

## 2022-07-23 ENCOUNTER — Other Ambulatory Visit: Payer: No Typology Code available for payment source

## 2022-07-23 ENCOUNTER — Other Ambulatory Visit: Payer: No Typology Code available for payment source | Admitting: Obstetrics & Gynecology

## 2022-07-25 DIAGNOSIS — H52223 Regular astigmatism, bilateral: Secondary | ICD-10-CM | POA: Diagnosis not present

## 2022-07-28 DIAGNOSIS — N978 Female infertility of other origin: Secondary | ICD-10-CM | POA: Diagnosis not present

## 2022-07-28 NOTE — Telephone Encounter (Signed)
Per review of EPIC, patient cx upcoming PUS and labs with Dr. Dellis Filbert.   Call to patient to get update. Left message to call Sharee Pimple, RN at [place], [phone], option 5.

## 2022-08-03 DIAGNOSIS — Z3201 Encounter for pregnancy test, result positive: Secondary | ICD-10-CM | POA: Diagnosis not present

## 2022-08-04 DIAGNOSIS — N978 Female infertility of other origin: Secondary | ICD-10-CM | POA: Diagnosis not present

## 2022-08-05 DIAGNOSIS — Z3201 Encounter for pregnancy test, result positive: Secondary | ICD-10-CM | POA: Diagnosis not present

## 2022-08-05 NOTE — Addendum Note (Signed)
Addended by: Burnice Logan on: 08/05/2022 11:53 AM   Modules accepted: Orders

## 2022-08-05 NOTE — Telephone Encounter (Signed)
No return call from patient.  Routing FYI.   Encounter closed.   Cc: Dr. Dellis Filbert

## 2022-08-07 DIAGNOSIS — Z3201 Encounter for pregnancy test, result positive: Secondary | ICD-10-CM | POA: Diagnosis not present

## 2022-08-12 DIAGNOSIS — Z3201 Encounter for pregnancy test, result positive: Secondary | ICD-10-CM | POA: Diagnosis not present

## 2022-08-14 DIAGNOSIS — Z3201 Encounter for pregnancy test, result positive: Secondary | ICD-10-CM | POA: Diagnosis not present

## 2022-08-17 DIAGNOSIS — Z3201 Encounter for pregnancy test, result positive: Secondary | ICD-10-CM | POA: Diagnosis not present

## 2022-08-18 DIAGNOSIS — Z3201 Encounter for pregnancy test, result positive: Secondary | ICD-10-CM | POA: Diagnosis not present

## 2022-08-24 ENCOUNTER — Inpatient Hospital Stay (HOSPITAL_COMMUNITY): Payer: No Typology Code available for payment source

## 2022-08-24 ENCOUNTER — Inpatient Hospital Stay (HOSPITAL_COMMUNITY)
Admission: AD | Admit: 2022-08-24 | Discharge: 2022-08-24 | Disposition: A | Discharge status: Home / Self Care | Payer: No Typology Code available for payment source | Attending: Family Medicine | Admitting: Family Medicine

## 2022-08-24 ENCOUNTER — Encounter (HOSPITAL_COMMUNITY): Payer: Self-pay | Admitting: Family Medicine

## 2022-08-24 DIAGNOSIS — O09292 Supervision of pregnancy with other poor reproductive or obstetric history, second trimester: Secondary | ICD-10-CM | POA: Insufficient documentation

## 2022-08-24 DIAGNOSIS — O09291 Supervision of pregnancy with other poor reproductive or obstetric history, first trimester: Secondary | ICD-10-CM | POA: Diagnosis not present

## 2022-08-24 DIAGNOSIS — O3680X0 Pregnancy with inconclusive fetal viability, not applicable or unspecified: Secondary | ICD-10-CM

## 2022-08-24 DIAGNOSIS — Z3A01 Less than 8 weeks gestation of pregnancy: Secondary | ICD-10-CM | POA: Diagnosis not present

## 2022-08-24 DIAGNOSIS — O209 Hemorrhage in early pregnancy, unspecified: Secondary | ICD-10-CM | POA: Diagnosis not present

## 2022-08-24 DIAGNOSIS — O09521 Supervision of elderly multigravida, first trimester: Secondary | ICD-10-CM | POA: Insufficient documentation

## 2022-08-24 DIAGNOSIS — N939 Abnormal uterine and vaginal bleeding, unspecified: Secondary | ICD-10-CM

## 2022-08-24 LAB — HCG, QUANTITATIVE, PREGNANCY: hCG, Beta Chain, Quant, S: 20251 m[IU]/mL — ABNORMAL HIGH (ref <5)

## 2022-08-24 LAB — CBC
HCT: 38.8 % (ref 36.0–46.0)
Hemoglobin: 12.2 g/dL (ref 12.0–15.0)
MCH: 25.9 pg — ABNORMAL LOW (ref 26.0–34.0)
MCHC: 31.4 g/dL (ref 30.0–36.0)
MCV: 82.4 fL (ref 80.0–100.0)
Platelets: 280 10*3/uL (ref 150–400)
RBC: 4.71 MIL/uL (ref 3.87–5.11)
RDW: 14.5 % (ref 11.5–15.5)
WBC: 7.7 10*3/uL (ref 4.0–10.5)
nRBC: 0 % (ref 0.0–0.2)

## 2022-08-24 LAB — POCT PREGNANCY, URINE: Preg Test, Ur: POSITIVE — AB

## 2022-08-24 NOTE — MAU Note (Signed)
.  Joann [name] is a 42 y.o. at Unknown here in MAU reporting: IVF transfer on 07/28/22. Started having vag bleeding on Saturday and some mild cramping. LMP:  Onset of complaint: Saturday Pain score: 3-4 Vitals:   08/24/22 1015  BP: 138/87  Pulse: 88  Resp: 18  Temp: 98.6 F (37 C)     FHT:n/a Lab orders placed from triage:  UPT

## 2022-08-24 NOTE — MAU Provider Note (Signed)
History     CSN: [id number]  Arrival date and time: 08/24/22 0948   None     Chief Complaint  Patient presents with   Vaginal Bleeding   HPI Patient is a 42 year old G 2P1001 at 5 weeks and 6 days calculated by embryo transfer of 3/26.  Pregnancy is complicated by IVF pregnancy with embryo transfer of 3/26, h/o cesarean section, h/o GHTN, and a BMI of 50.  She had the embryo transfer in [place].  This she has not had any follow-up since the embryo transfer.  She did have vaginal bleeding and cramping that started this weekend and has continued for the past 2 to 3 days.  She presents for evaluation.   OB History     Gravida  1   Para  1   Term  1   Preterm  0   AB  0   Living  1      SAB  0   IAB  0   Ectopic  0   Multiple  0   Live Births  1           Past Medical History:  Diagnosis Date   Anxiety    Fibroids    Pregnancy induced hypertension     Past Surgical History:  Procedure Laterality Date   CESAREAN SECTION N/A 10/15/2021   Procedure: CESAREAN SECTION;  Surgeon: Maxie Better, MD;  Location: [facility];  Service: Obstetrics;  Laterality: N/A;   HYSTEROSCOPY     MYOMECTOMY      Family History  Problem Relation Age of Onset   Asthma Mother    Hypertension Mother    Diabetes Father    Hypertension Father    Asthma Sister    Cancer Maternal Aunt    Stroke Maternal Aunt    Heart disease Maternal Uncle    Hypertension Maternal Grandmother    Stroke Paternal Grandmother    Diabetes Paternal Grandmother     Social History   Tobacco Use   Smoking status: Never   Smokeless tobacco: Never  Vaping Use   Vaping Use: Never used  Substance Use Topics   Alcohol use: Not Currently   Drug use: No    Allergies: No Known Allergies  Medications Prior to Admission  Medication Sig Dispense Refill Last Dose   Cholecalciferol (VITAMIN D) 50 MCG (2000 UT) CAPS Take 4,000 Units by mouth daily. Gummy   08/24/2022   ferrous sulfate 325  (65 FE) MG tablet Take 195 mg by mouth daily with breakfast.   08/24/2022   Magnesium Oxide 250 MG TABS Take 250 mg by mouth daily.   08/24/2022   Omega-3 Fatty Acids (FISH OIL) 1200 MG CPDR Take 1,200 mg by mouth daily.   08/24/2022   Prenatal Vit-Fe Fumarate-FA (MULTIVITAMIN-PRENATAL) 27-0.8 MG TABS tablet Take 1 tablet by mouth daily at 12 noon. Dha and Folic Acid   08/24/2022   albuterol (VENTOLIN HFA) 108 (90 Base) MCG/ACT inhaler Inhale 2 puffs into the lungs every 4 (four) hours as needed for shortness of breath or cough. (Patient not taking: Reported on 03/05/2022)      doxycycline (VIBRAMYCIN) 100 MG capsule Take by mouth. (Patient not taking: Reported on 08/24/2022)   Not Taking   folic acid (FOLVITE) 800 MCG tablet Take 800 mcg by mouth daily. (Patient not taking: Reported on 08/24/2022)   Not Taking   ibuprofen (ADVIL) 600 MG tablet Take 1 tablet (600 mg total) by mouth every 6 (six)  hours as needed. (Patient not taking: Reported on 08/24/2022) 30 tablet 11 Not Taking   naproxen (NAPROSYN) 500 MG tablet Take 1 tablet (500 mg total) by mouth 2 (two) times daily. (Patient not taking: Reported on 08/24/2022) 14 tablet 0 Not Taking   NIFEdipine (ADALAT CC) 30 MG 24 hr tablet Take 1 tablet (30 mg total) by mouth daily. (Patient not taking: Reported on 08/24/2022) 30 tablet 11 Not Taking    Review of Systems Physical Exam   Blood pressure 138/87, pulse 88, temperature 98.6 F (37 C), resp. rate 18, height 5\' 5"  (1.651 m), weight (!) 136.3 kg, last menstrual period 07/06/2022, not currently breastfeeding.  Physical Exam Vitals reviewed. Exam conducted with a chaperone present.  Constitutional:      Appearance: Normal appearance.  Cardiovascular:     Rate and Rhythm: Normal rate and regular rhythm.  Abdominal:     Hernia: There is no hernia in the left inguinal area or right inguinal area.  Genitourinary:    Labia:        Right: No rash or tenderness.        Left: No rash or tenderness.       Vagina: No signs of injury. No vaginal discharge or tenderness.     Cervix: Cervical bleeding present. No cervical motion tenderness, friability or erythema.     Uterus: Normal.   Lymphadenopathy:     Lower Body: No right inguinal adenopathy. No left inguinal adenopathy.  Skin:    General: Skin is warm.     Capillary Refill: Capillary refill takes less than 2 seconds.  Neurological:     General: No focal deficit present.     Mental Status: She is alert.  Psychiatric:        Mood and Affect: Mood normal.        Behavior: Behavior normal.        Thought Content: Thought content normal.        Judgment: Judgment normal.    Results for orders placed or performed during the hospital encounter of 08/24/22 (from the past 24 hour(s))  Pregnancy, urine POC     Status: Abnormal   Collection Time: 08/24/22 10:33 AM  Result Value Ref Range   Preg Test, Ur POSITIVE (A) NEGATIVE  hCG, quantitative, pregnancy     Status: Abnormal   Collection Time: 08/24/22 10:49 AM  Result Value Ref Range   hCG, Beta Chain, Quant, S 20,251 (H) <5 mIU/mL  CBC     Status: Abnormal   Collection Time: 08/24/22 10:49 AM  Result Value Ref Range   WBC 7.7 4.0 - 10.5 K/uL   RBC 4.71 3.87 - 5.11 MIL/uL   Hemoglobin 12.2 12.0 - 15.0 g/dL   HCT 16.1 09.6 - 04.5 %   MCV 82.4 80.0 - 100.0 fL   MCH 25.9 (L) 26.0 - 34.0 pg   MCHC 31.4 30.0 - 36.0 g/dL   RDW 40.9 81.1 - 91.4 %   Platelets 280 150 - 400 K/uL   nRBC 0.0 0.0 - 0.2 %   US OB LESS THAN 14 WEEKS WITH OB TRANSVAGINAL  Result Date: 08/24/2022 CLINICAL DATA:  IVF transfer on 07/28/2022, vaginal bleeding EXAM: OBSTETRIC <14 WK [place] AND TRANSVAGINAL OB US TECHNIQUE: Both transabdominal and transvaginal ultrasound examinations were performed for complete evaluation of the gestation as well as the maternal uterus, adnexal regions, and pelvic cul-de-sac. Transvaginal technique was performed to assess early pregnancy. COMPARISON:  None Available. FINDINGS: Intrauterine  gestational sac:  Single Yolk sac:  Seen Embryo:  Not seen Cardiac Activity: Not seen MSD: 13 x 9 mm   6 w   3 d Subchorionic hemorrhage:  None visualized. Maternal uterus/adnexae: Right ovary is not sonographically visualized. Left ovary is unremarkable. There are no adnexal masses. There is no free fluid in pelvis. IMPRESSION: There is a gestational sac containing a yolk sac within the fundus of the uterus. There is no demonstrable fetal pole or fetal cardiac activity. Estimated gestational age by mean sac diameter is 6 weeks 3 days. Findings may suggest early IUP or failed gestation with incomplete abortion. Serial HCG estimations and short-term follow-up sonogram in 1-2 weeks may be considered. Electronically Signed   By: Ernie Avena M.D.   On: 08/24/2022 13:33     MAU Course  Procedures  MDM Imaging: I independent reviewed the images of the [place]. GS measuring 6 weeks 3 days. Yolk sac seen, no embryo seen.    Assessment and Plan   1. [redacted] weeks gestation of pregnancy   2. Pregnancy with uncertain fetal viability, single or unspecified fetus   3. Vaginal bleeding    Will arrange for [place] in 10 days. Return precautions given.  Levie Heritage 08/24/2022, 1:36 PM

## 2022-08-25 DIAGNOSIS — N911 Secondary amenorrhea: Secondary | ICD-10-CM | POA: Diagnosis not present

## 2022-09-01 DIAGNOSIS — N911 Secondary amenorrhea: Secondary | ICD-10-CM | POA: Diagnosis not present

## 2022-09-02 DIAGNOSIS — O039 Complete or unspecified spontaneous abortion without complication: Secondary | ICD-10-CM

## 2022-09-02 HISTORY — DX: Complete or unspecified spontaneous abortion without complication: O03.9

## 2022-09-04 ENCOUNTER — Ambulatory Visit (HOSPITAL_COMMUNITY): Payer: No Typology Code available for payment source

## 2022-09-06 ENCOUNTER — Inpatient Hospital Stay (HOSPITAL_COMMUNITY)
Admission: AD | Admit: 2022-09-06 | Discharge: 2022-09-06 | Disposition: A | Discharge status: Home / Self Care | Payer: No Typology Code available for payment source | Attending: Obstetrics and Gynecology | Admitting: Obstetrics and Gynecology

## 2022-09-06 ENCOUNTER — Encounter (HOSPITAL_COMMUNITY): Payer: Self-pay

## 2022-09-06 ENCOUNTER — Inpatient Hospital Stay (HOSPITAL_COMMUNITY): Payer: No Typology Code available for payment source

## 2022-09-06 DIAGNOSIS — N939 Abnormal uterine and vaginal bleeding, unspecified: Secondary | ICD-10-CM

## 2022-09-06 DIAGNOSIS — Z3A01 Less than 8 weeks gestation of pregnancy: Secondary | ICD-10-CM | POA: Diagnosis not present

## 2022-09-06 DIAGNOSIS — Z3A08 8 weeks gestation of pregnancy: Secondary | ICD-10-CM

## 2022-09-06 DIAGNOSIS — O034 Incomplete spontaneous abortion without complication: Secondary | ICD-10-CM | POA: Diagnosis not present

## 2022-09-06 DIAGNOSIS — O209 Hemorrhage in early pregnancy, unspecified: Secondary | ICD-10-CM | POA: Diagnosis not present

## 2022-09-06 LAB — CBC
HCT: 36.8 % (ref 36.0–46.0)
Hemoglobin: 11.6 g/dL — ABNORMAL LOW (ref 12.0–15.0)
MCH: 26 pg (ref 26.0–34.0)
MCHC: 31.5 g/dL (ref 30.0–36.0)
MCV: 82.5 fL (ref 80.0–100.0)
Platelets: 272 10*3/uL (ref 150–400)
RBC: 4.46 MIL/uL (ref 3.87–5.11)
RDW: 14.3 % (ref 11.5–15.5)
WBC: 6.5 10*3/uL (ref 4.0–10.5)
nRBC: 0 % (ref 0.0–0.2)

## 2022-09-06 LAB — ABO/RH: ABO/RH(D): B POS

## 2022-09-06 LAB — HCG, QUANTITATIVE, PREGNANCY: hCG, Beta Chain, Quant, S: 30691 m[IU]/mL — ABNORMAL HIGH (ref <5)

## 2022-09-06 MED ORDER — MISOPROSTOL 200 MCG PO TABS: ORAL_TABLET | ORAL | 1 refills | Status: DC

## 2022-09-06 MED ORDER — ONDANSETRON 4 MG PO TBDP: 4.0000 mg | ORAL_TABLET | Freq: Three times a day (TID) | ORAL | 0 refills | Status: DC | PRN

## 2022-09-06 MED ORDER — FENTANYL CITRATE (PF) 100 MCG/2ML IJ SOLN: 100.0000 ug | Freq: Once | INTRAMUSCULAR | Status: DC

## 2022-09-06 MED ORDER — MISOPROSTOL 200 MCG PO TABS: 800.0000 ug | ORAL_TABLET | Freq: Once | ORAL | 0 refills | Status: DC

## 2022-09-06 MED ORDER — OXYCODONE-ACETAMINOPHEN 5-325 MG PO TABS: 1.0000 | ORAL_TABLET | Freq: Four times a day (QID) | ORAL | 0 refills | Status: DC | PRN

## 2022-09-06 MED ORDER — IBUPROFEN 600 MG PO TABS: 600.0000 mg | ORAL_TABLET | Freq: Four times a day (QID) | ORAL | 3 refills | Status: AC | PRN

## 2022-09-06 MED ORDER — KETOROLAC TROMETHAMINE 30 MG/ML IJ SOLN: 30.0000 mg | Freq: Once | INTRAMUSCULAR | Status: DC

## 2022-09-06 MED ORDER — LIDOCAINE HCL (PF) 1 % IJ SOLN: 30.0000 mL | Freq: Once | INTRAMUSCULAR | Status: DC

## 2022-09-06 MED ORDER — LOPERAMIDE HCL 2 MG PO TABS: 2.0000 mg | ORAL_TABLET | Freq: Four times a day (QID) | ORAL | 0 refills | Status: DC | PRN

## 2022-09-06 NOTE — MAU Provider Note (Signed)
History     CSN: [id number]  Arrival date and time: 09/06/22 1044   Event Date/Time   First Provider Initiated Contact with Patient 09/06/22 1132      Chief Complaint  Patient presents with   Vaginal Bleeding   Joann [name] , a  42 y.o. G2P1001 at [redacted]w[redacted]d presents to MAU with complaints of heavy vaginal bleeding. Patient reports that she has been experiencing some on going intermittent spotting for a few weeks but this morning at 730am she began having bright red "watery" vaginal bleeding and passing clots. She reports going through 4-5 extra long maxi pads since this morning and reports passing 4/5 large baseball sized clots. She states that "it just feels like water running down her legs." She currently denies any pain. She states that she was having some period like cramping last night that she rates a 6/10 but states it only lasted about 20-30 minutes. She denies recent intercourse abnormal vaginal discharge prior to heavy bleeding. She endorses feeling light headed and dizzy. Patient has been NPO since yesterday evening.          OB History     Gravida  2   Para  1   Term  1   Preterm  0   AB  0   Living  1      SAB  0   IAB  0   Ectopic  0   Multiple  0   Live Births  1           Past Medical History:  Diagnosis Date   Anxiety    Fibroids    Pregnancy induced hypertension     Past Surgical History:  Procedure Laterality Date   CESAREAN SECTION N/A 10/15/2021   Procedure: CESAREAN SECTION;  Surgeon: Maxie Better, MD;  Location: [facility];  Service: Obstetrics;  Laterality: N/A;   HYSTEROSCOPY     MYOMECTOMY      Family History  Problem Relation Age of Onset   Asthma Mother    Hypertension Mother    Diabetes Father    Hypertension Father    Asthma Sister    Cancer Maternal Aunt    Stroke Maternal Aunt    Heart disease Maternal Uncle    Hypertension Maternal Grandmother    Stroke Paternal Grandmother    Diabetes Paternal  Grandmother     Social History   Tobacco Use   Smoking status: Never   Smokeless tobacco: Never  Vaping Use   Vaping Use: Never used  Substance Use Topics   Alcohol use: Not Currently   Drug use: No    Allergies: No Known Allergies  Medications Prior to Admission  Medication Sig Dispense Refill Last Dose   Cholecalciferol (VITAMIN D) 50 MCG (2000 UT) CAPS Take 4,000 Units by mouth daily. Gummy   09/05/2022   NIFEdipine (ADALAT CC) 30 MG 24 hr tablet Take 30 mg by mouth daily.      Omega-3 Fatty Acids (FISH OIL) 1200 MG CPDR Take 1,200 mg by mouth daily.   09/05/2022   Prenatal Vit-Fe Fumarate-FA (MULTIVITAMIN-PRENATAL) 27-0.8 MG TABS tablet Take 1 tablet by mouth daily at 12 noon. Dha and Folic Acid   Past Month    Review of Systems  Genitourinary:  Positive for vaginal bleeding.  Neurological:  Positive for dizziness and light-headedness.   Physical Exam   Blood pressure 126/87, pulse 98, temperature 98.6 F (37 C), temperature source Oral, resp. rate 15, height 5\' 5"  (  1.651 m), weight 135.7 kg, last menstrual period 07/06/2022, SpO2 99 %, not currently breastfeeding.  Physical Exam Vitals and nursing note reviewed.  Constitutional:      General: She is not in acute distress.    Appearance: Normal appearance.  HENT:     Head: Normocephalic.  Cardiovascular:     Rate and Rhythm: Normal rate and regular rhythm.  Pulmonary:     Effort: Pulmonary effort is normal.  Genitourinary:    Comments: Notable bright red bleeding observed on pad. No clots. Dried blood observed on inner thighs.  Musculoskeletal:     Cervical back: Normal range of motion.  Skin:    General: Skin is warm and dry.  Neurological:     Mental Status: She is alert and oriented to person, place, and time.  Psychiatric:        Mood and Affect: Mood normal.     MAU Course  Procedures Orders Placed This Encounter  Procedures   US OB LESS THAN 14 WEEKS WITH OB TRANSVAGINAL   CBC   hCG, quantitative,  pregnancy   Diet NPO time specified   ABO/Rh   Results for orders placed or performed during the hospital encounter of 09/06/22 (from the past 24 hour(s))  CBC     Status: Abnormal   Collection Time: 09/06/22 11:32 AM  Result Value Ref Range   WBC 6.5 4.0 - 10.5 K/uL   RBC 4.46 3.87 - 5.11 MIL/uL   Hemoglobin 11.6 (L) 12.0 - 15.0 g/dL   HCT 14.7 82.9 - 56.2 %   MCV 82.5 80.0 - 100.0 fL   MCH 26.0 26.0 - 34.0 pg   MCHC 31.5 30.0 - 36.0 g/dL   RDW 13.0 86.5 - 78.4 %   Platelets 272 150 - 400 K/uL   nRBC 0.0 0.0 - 0.2 %  ABO/Rh     Status: None   Collection Time: 09/06/22 11:32 AM  Result Value Ref Range   ABO/RH(D) B POS    No rh immune globuloin      NOT A RH IMMUNE GLOBULIN CANDIDATE, PT RH POSITIVE Performed at [facility] : [facility], [address], [place], [place] [address]   hCG, quantitative, pregnancy     Status: Abnormal   Collection Time: 09/06/22 11:32 AM  Result Value Ref Range   hCG, Beta Chain, Quant, S 30,691 (H) <5 mIU/mL   US OB Transvaginal  Result Date: 09/06/2022 CLINICAL DATA:  [id number] Vaginal bleeding [id number] patient with history of IVF embryo transfer on March 26. Heavy vaginal bleeding since April 22nd. Prior ultrasound dated 08/24/2022 demonstrated a gestational sac and yolk sac only. EXAM: TRANSVAGINAL OB ULTRASOUND TECHNIQUE: Transvaginal ultrasound was performed for complete evaluation of the gestation as well as the maternal uterus, adnexal regions, and pelvic cul-de-sac. COMPARISON:  08/24/2022. FINDINGS: Intrauterine gestational sac: Single Yolk sac:  Visualized. Embryo:  Not Visualized. MSD: 20.9 mm 7 w 0 d. This previously measured 13.9 mm on April 22nd for an estimated gestational age of [redacted] weeks 3 days. LMP: 07/06/2022.  GA by LMP would be 8 weeks 6 days. Subchorionic hemorrhage:  None visualized. Maternal uterus/adnexae: Ovaries are not visualized on today's exam transvaginally. IMPRESSION: Yolk sac is again visualized but no embryo is visualized. Given it  has been greater than 11 days since previous ultrasound, findings meet definitive criteria for failed pregnancy. This follows SRU consensus guidelines: Diagnostic Criteria for Nonviable Pregnancy Early in the First Trimester. [facility] Mis J Med [phone]. Electronically Signed  By: Meda Klinefelter M.D.   On: 09/06/2022 12:43     MDM - Hemoglobin 11.6 platelet count 272. VS stable Patient hemodynamically stable  - [place] results revealed a gestational sac and yolk sac without an embryo. Similar results from previous US completed 13 days ago. This now meets criteria for a failed pregnancy and likely miscarriage.  - Call placed to Dr. Imogene Burn to reviewed results and discuss plan of care. Reviewed patient presentation and current clinical picture.  - Dr. Imogene Burn down to discuss options with patient.  - Per Dr. Imogene Burn, plan to discharge home with buccal Cytotec and continue with scheduled visit on Tuesday in the office.  - plan for discharge.   Assessment and Plan   1. Incomplete miscarriage   2. [redacted] weeks gestation of pregnancy   3. Vaginal bleeding    - Reviewed that this is a miscarriage in progress.  - Rx for Cytotec, Ibuprofen, Zofran, Imodium and a short course of percocet sent to outpatient pharmacy for pick up.  - Worsening signs and return precautions reviewed with patient.  - Bleeding expectations and precautions reviewed . - Patient discharged home in stable condition and may return to MAU as needed.    Claudette Head, MSN CNM  09/06/2022, 11:33 AM

## 2022-09-06 NOTE — Progress Notes (Addendum)
Brief MAU Eval Note  Joann [name] is a 41yo G2P1001 here for evaluation of vaginal bleeding. She had IVF transfer in [place] on 3/26. Was initially evaluated for vaginal bleeding in the MAU on 4/22 where a GS+YS were seen on Korea. She then came to see me in the office 4/23, had an Korea that was essentially unchanged. She had intermittent abdominal cramping and scant vaginal bleeding at the time. F/u US in the office 4/30 again with GS+YS but no fetal pole. We discussed at the time that this was highly suspicious of a mAB - I offered her and her husband management options. They elected to return for a definitive ultrasound, which was scheduled for 5/7. At the time, they had decided likely medication management with cytotec.  Today, she presented to the MAU for evaluation of heavy vaginal bleeding - had passed significant clots and had abdominal cramping. TVUS today with GS, YS without fetal pole. On my evaluation, denies current lightheadedness, dizziness, SOB. She had initially discussed with her IVF physician in [place] that she now desired surgical management. I discussed with her that in the setting of normal vitals and hgb 11.6 (stable from 12.2), I would recommend either scheduling a D&C in the operating room or proceeding with an MVA at bedside with fentanyl and ativan if she desires more immediate surgical management. We discussed both options, all questions answered. Ultimately after consideration, she desires medication management with the understanding that it has about a 10% failure rate that could necessitate surgical management. Rx provided for two doses of 800 mcg buccal cytotec, zofran, ibuprofen, and percocet. Strict bleeding precautions reviewed with the patient.   She has an appointment to see me in the office on 5/7.   Jule Economy, MD

## 2022-09-06 NOTE — MAU Note (Signed)
...  Joann [name] is a 42 y.o. at approximately [redacted]w[redacted]d here in MAU reporting: Heavy vaginal bleeding that increased this morning at 0730. She reports she is not experiencing any pain but was experiencing pain yesterday. She reports since leaving MAU on 4/22 her bleeding never stopped. She reports since 0730 she has soaked 4-5 "super long" maxi pads. She reports she has passed at least 5 baseball sized blood clots. Last ate yesterday evening. Feeling weak and dizzy.  IVF transfer on 07/28/2022. Had vaginal bleeding accompanied by abdominal cramping when evaluated in MAU on 4/22.  Next OB appointment 5/7.  4/22 Korea from MAU:  MSD 13x52mm [redacted]w[redacted]d There is a gestational sac containing a yolk sac within the fundus  of the uterus. There is no demonstrable fetal pole or fetal cardiac activity. Estimated gestational age by mean sac diameter is 6 weeks 3 days. Findings may suggest early IUP or failed gestation with incomplete abortion. Serial HCG estimations and short-term follow-up sonogram in 1-2 weeks may be considered.   Onset of complaint:  Pain score: Denies pain.  Lab orders placed from triage:  none

## 2022-09-14 DIAGNOSIS — O200 Threatened abortion: Secondary | ICD-10-CM | POA: Diagnosis not present

## 2022-09-25 ENCOUNTER — Encounter (HOSPITAL_BASED_OUTPATIENT_CLINIC_OR_DEPARTMENT_OTHER): Payer: Self-pay | Admitting: Obstetrics and Gynecology

## 2022-09-25 ENCOUNTER — Other Ambulatory Visit: Payer: Self-pay

## 2022-09-25 DIAGNOSIS — O021 Missed abortion: Secondary | ICD-10-CM | POA: Diagnosis not present

## 2022-09-25 NOTE — Progress Notes (Signed)
Spoke w/ via phone for pre-op interview---Joann [name] needs dos---- ISTAT, EKG per anesthesia, surgeon orders pending as of 09/25/22           [name] results------09/06/22 CBC in Epic COVID test -----patient states asymptomatic no test needed Arrive at -------0530 on Tuesday, 09/29/22 NPO after MN NO Solid Food.  Clear liquids from MN until---0430 Med rec completed Medications to take morning of surgery -----Nifedipine Diabetic medication -----n/a Patient instructed no nail polish to be worn day of surgery Patient instructed to bring photo id and insurance card day of surgery Patient aware to have Driver (ride ) / caregiver    for 24 hours after surgery - Joann [name] Patient Special Instructions -----none Pre-Op special Instructions -----Case added on 09/25/22. Awaiting surgeon orders. Patient verbalized understanding of instructions that were given at this phone interview. Patient denies shortness of breath, chest pain, fever, cough at this phone interview.

## 2022-09-28 NOTE — H&P (Signed)
Preop History and Physical  Joann [name] is an 42 y.o. female. [id number] here for suction dilation and curettage for retained POCs. She is s/p two doses of cytotec - had Korea on 5/24 with evidence of retained products in lower uterine segment and endocervical canal.  Pertinent Gynecological History: Menses: regular Bleeding: none Contraception: none DES exposure: unknown Blood transfusions: none Sexually transmitted diseases: no past history OB History: G2 P1011      Past Medical History:  Diagnosis Date   Anemia    taking iron supplementation   Anxiety    resolved per pt 2024   Asthma    mild asthma during pregnancy   Fibroids    HTN (hypertension)    Ob/gyn prescribed Nifedipine. Patient is going to get a PCP.   Miscarriage 09/2022   Pregnancy induced hypertension     Past Surgical History:  Procedure Laterality Date   CESAREAN SECTION N/A 10/15/2021   Procedure: CESAREAN SECTION;  Surgeon: Maxie Better, MD;  Location: [facility];  Service: Obstetrics;  Laterality: N/A;   HYSTEROSCOPY  2021   MYOMECTOMY  2020    Family History  Problem Relation Age of Onset   Asthma Mother    Hypertension Mother    Diabetes Father    Hypertension Father    Asthma Sister    Cancer Maternal Aunt    Stroke Maternal Aunt    Heart disease Maternal Uncle    Hypertension Maternal Grandmother    Stroke Paternal Grandmother    Diabetes Paternal Grandmother     Social History:  reports that she has never smoked. She has never used smokeless tobacco. She reports that she does not currently use alcohol. She reports that she does not use drugs.  Allergies: No Known Allergies  No medications prior to admission.   Vitals wnl, reviewed from office visit Weight 136.1 kg, last menstrual period 07/06/2022, not currently breastfeeding.  Constitutional:      Appearance: Morbid obesity. HENT:     Head: Normocephalic.  Eyes:     Pupils: Pupils are equal, round. Cardiovascular:      Rate and Rhythm: Normal rate.     Pulses: Normal pulses.  Abdominal:     General: Abdomen is Gravid, nontender Neurological:     Mental Status: She is alert.   B+ Remainder of labs on arrival to preop  Assessment/Plan: Joann [name] is an 42 y.o. female. [id number] here for suction dilation and curettage for retained POCs  Pt counseled on TVUS results and diagnosis of MAB. Discussed that it is unlikely to be her fault nor could she have prevented it. Reviewed that this miscarriage does not likely reflect her ability to have a successful pregnancy in the future, and that miscarriage is common - 1:5 pregnancies. She is s/p two doses of cytotec with persistent retained POCs. Patient was counseled re definitive surgical management with suction dilation and curettage.   Risks/benefits/ and alternatives reviewed with patient with risks including but not limited to bleeding, infection, uterine perforation, and damage to nearby structures such as the bowel, bladder, vessels, and/or other organs. She was given opportunity to ask questions and all questions answered. Patient consents to proceed with suction dilation and curettage. Reviewed bleeding precautions. Her blood type is B+ and she does not need Rhogam. Given instructions and she verbalizes understanding. Discussed the importance of having at least one normal periods after completion of the process, before attempting conception again. Discussed S/S to call back for. All questions answered  and pt verbalizes understanding w/out further questions/concerns. Risks discussed including infection, bleeding, damage to surrounding structures, need for additional procedures, postoperative DVT and subsequent scarring. All questions answered. Consent signed in office.   Tawni Levy 09/28/2022, 4:03 PM

## 2022-09-29 ENCOUNTER — Ambulatory Visit (HOSPITAL_BASED_OUTPATIENT_CLINIC_OR_DEPARTMENT_OTHER)
Admission: RE | Admit: 2022-09-29 | Discharge: 2022-09-29 | Disposition: A | Discharge status: Home / Self Care | Payer: No Typology Code available for payment source | Source: Ambulatory Visit | Attending: Obstetrics and Gynecology | Admitting: Obstetrics and Gynecology

## 2022-09-29 ENCOUNTER — Ambulatory Visit (HOSPITAL_BASED_OUTPATIENT_CLINIC_OR_DEPARTMENT_OTHER): Payer: No Typology Code available for payment source | Admitting: Anesthesiology

## 2022-09-29 ENCOUNTER — Encounter (HOSPITAL_BASED_OUTPATIENT_CLINIC_OR_DEPARTMENT_OTHER)
Admission: RE | Disposition: A | Discharge status: Home / Self Care | Payer: Self-pay | Source: Ambulatory Visit | Attending: Obstetrics and Gynecology

## 2022-09-29 ENCOUNTER — Other Ambulatory Visit: Payer: Self-pay

## 2022-09-29 ENCOUNTER — Encounter (HOSPITAL_BASED_OUTPATIENT_CLINIC_OR_DEPARTMENT_OTHER): Payer: Self-pay | Admitting: Obstetrics and Gynecology

## 2022-09-29 DIAGNOSIS — J45909 Unspecified asthma, uncomplicated: Secondary | ICD-10-CM | POA: Diagnosis not present

## 2022-09-29 DIAGNOSIS — O9953 Diseases of the respiratory system complicating the puerperium: Secondary | ICD-10-CM | POA: Diagnosis not present

## 2022-09-29 DIAGNOSIS — O034 Incomplete spontaneous abortion without complication: Secondary | ICD-10-CM | POA: Diagnosis not present

## 2022-09-29 DIAGNOSIS — Z3A11 11 weeks gestation of pregnancy: Secondary | ICD-10-CM | POA: Insufficient documentation

## 2022-09-29 DIAGNOSIS — O029 Abnormal product of conception, unspecified: Secondary | ICD-10-CM | POA: Diagnosis not present

## 2022-09-29 DIAGNOSIS — O021 Missed abortion: Secondary | ICD-10-CM | POA: Diagnosis not present

## 2022-09-29 DIAGNOSIS — O99211 Obesity complicating pregnancy, first trimester: Secondary | ICD-10-CM | POA: Diagnosis not present

## 2022-09-29 DIAGNOSIS — O165 Unspecified maternal hypertension, complicating the puerperium: Secondary | ICD-10-CM

## 2022-09-29 DIAGNOSIS — Z01818 Encounter for other preprocedural examination: Secondary | ICD-10-CM

## 2022-09-29 HISTORY — DX: Unspecified asthma, uncomplicated: J45.909

## 2022-09-29 HISTORY — DX: Anemia, unspecified: D64.9

## 2022-09-29 HISTORY — DX: Essential (primary) hypertension: I10

## 2022-09-29 HISTORY — PX: DILATION AND EVACUATION: SHX1459

## 2022-09-29 LAB — RPR: RPR Ser Ql: NONREACTIVE

## 2022-09-29 LAB — CBC
HCT: 39.3 % (ref 36.0–46.0)
Hemoglobin: 12 g/dL (ref 12.0–15.0)
MCH: 25.3 pg — ABNORMAL LOW (ref 26.0–34.0)
MCHC: 30.5 g/dL (ref 30.0–36.0)
MCV: 82.9 fL (ref 80.0–100.0)
Platelets: 330 10*3/uL (ref 150–400)
RBC: 4.74 MIL/uL (ref 3.87–5.11)
RDW: 14.4 % (ref 11.5–15.5)
WBC: 4.9 10*3/uL (ref 4.0–10.5)
nRBC: 0 % (ref 0.0–0.2)

## 2022-09-29 LAB — POCT I-STAT, CHEM 8
BUN: 10 mg/dL (ref 6–20)
Calcium, Ion: 1.27 mmol/L (ref 1.15–1.40)
Chloride: 104 mmol/L (ref 98–111)
Creatinine, Ser: 0.8 mg/dL (ref 0.44–1.00)
Glucose, Bld: 98 mg/dL (ref 70–99)
HCT: 39 % (ref 36.0–46.0)
Hemoglobin: 13.3 g/dL (ref 12.0–15.0)
Potassium: 4.1 mmol/L (ref 3.5–5.1)
Sodium: 139 mmol/L (ref 135–145)
TCO2: 25 mmol/L (ref 22–32)

## 2022-09-29 LAB — TYPE AND SCREEN
ABO/RH(D): B POS
Antibody Screen: NEGATIVE

## 2022-09-29 LAB — HIV ANTIBODY (ROUTINE TESTING W REFLEX): HIV Screen 4th Generation wRfx: NONREACTIVE

## 2022-09-29 SURGERY — DILATION AND EVACUATION, UTERUS
Anesthesia: General | Site: Uterus

## 2022-09-29 MED ORDER — FENTANYL CITRATE (PF) 100 MCG/2ML IJ SOLN: 25.0000 ug | INTRAMUSCULAR | Status: DC | PRN

## 2022-09-29 MED ORDER — LIDOCAINE 2% (20 MG/ML) 5 ML SYRINGE
INTRAMUSCULAR | Status: DC | PRN
  Administered 2022-09-29: 100 mg via INTRAVENOUS

## 2022-09-29 MED ORDER — MIDAZOLAM HCL 5 MG/5ML IJ SOLN
INTRAMUSCULAR | Status: DC | PRN
  Administered 2022-09-29: 2 mg via INTRAVENOUS

## 2022-09-29 MED ORDER — CHLOROPROCAINE HCL 1 % IJ SOLN
INTRAMUSCULAR | Status: DC | PRN
  Administered 2022-09-29: 10 mL

## 2022-09-29 MED ORDER — DOXYCYCLINE HYCLATE 100 MG PO TABS
200.0000 mg | ORAL_TABLET | Freq: Once | ORAL | Status: AC
  Administered 2022-09-29: 200 mg via ORAL

## 2022-09-29 MED ORDER — ACETAMINOPHEN 500 MG PO TABS
1000.0000 mg | ORAL_TABLET | Freq: Once | ORAL | Status: AC
  Administered 2022-09-29: 1000 mg via ORAL

## 2022-09-29 MED ORDER — FENTANYL CITRATE (PF) 100 MCG/2ML IJ SOLN
INTRAMUSCULAR | Status: AC
  Filled 2022-09-29: qty 2

## 2022-09-29 MED ORDER — DOXYCYCLINE HYCLATE 100 MG PO TABS
ORAL_TABLET | ORAL | Status: AC
  Filled 2022-09-29: qty 2

## 2022-09-29 MED ORDER — POVIDONE-IODINE 10 % EX SWAB: 2.0000 | Freq: Once | CUTANEOUS | Status: DC

## 2022-09-29 MED ORDER — PROPOFOL 10 MG/ML IV BOLUS
INTRAVENOUS | Status: AC
  Filled 2022-09-29: qty 20

## 2022-09-29 MED ORDER — KETOROLAC TROMETHAMINE 30 MG/ML IJ SOLN
INTRAMUSCULAR | Status: DC | PRN
  Administered 2022-09-29: 30 mg via INTRAVENOUS

## 2022-09-29 MED ORDER — ONDANSETRON HCL 4 MG/2ML IJ SOLN
INTRAMUSCULAR | Status: DC | PRN
  Administered 2022-09-29: 4 mg via INTRAVENOUS

## 2022-09-29 MED ORDER — LIDOCAINE HCL (PF) 2 % IJ SOLN
INTRAMUSCULAR | Status: AC
  Filled 2022-09-29: qty 5

## 2022-09-29 MED ORDER — ONDANSETRON HCL 4 MG/2ML IJ SOLN
INTRAMUSCULAR | Status: AC
  Filled 2022-09-29: qty 2

## 2022-09-29 MED ORDER — NYSTATIN 100000 UNIT/GM EX POWD: 1.0000 | Freq: Three times a day (TID) | CUTANEOUS | 0 refills | Status: AC

## 2022-09-29 MED ORDER — ACETAMINOPHEN 500 MG PO TABS
ORAL_TABLET | ORAL | Status: AC
  Filled 2022-09-29: qty 1

## 2022-09-29 MED ORDER — PROPOFOL 10 MG/ML IV BOLUS
INTRAVENOUS | Status: DC | PRN
  Administered 2022-09-29: 40 mg via INTRAVENOUS
  Administered 2022-09-29: 250 mg via INTRAVENOUS

## 2022-09-29 MED ORDER — DEXAMETHASONE SODIUM PHOSPHATE 10 MG/ML IJ SOLN
INTRAMUSCULAR | Status: AC
  Filled 2022-09-29: qty 1

## 2022-09-29 MED ORDER — 0.9 % SODIUM CHLORIDE (POUR BTL) OPTIME
TOPICAL | Status: DC | PRN
  Administered 2022-09-29: 500 mL

## 2022-09-29 MED ORDER — FENTANYL CITRATE (PF) 100 MCG/2ML IJ SOLN
INTRAMUSCULAR | Status: DC | PRN
  Administered 2022-09-29: 50 ug via INTRAVENOUS

## 2022-09-29 MED ORDER — KETOROLAC TROMETHAMINE 30 MG/ML IJ SOLN
INTRAMUSCULAR | Status: AC
  Filled 2022-09-29: qty 1

## 2022-09-29 MED ORDER — LACTATED RINGERS IV SOLN: INTRAVENOUS | Status: DC

## 2022-09-29 MED ORDER — MIDAZOLAM HCL 2 MG/2ML IJ SOLN
INTRAMUSCULAR | Status: AC
  Filled 2022-09-29: qty 2

## 2022-09-29 MED ORDER — DEXAMETHASONE SODIUM PHOSPHATE 10 MG/ML IJ SOLN
INTRAMUSCULAR | Status: DC | PRN
  Administered 2022-09-29: 5 mg via INTRAVENOUS

## 2022-09-29 SURGICAL SUPPLY — 17 items
CATH ROBINSON RED A/P 14FR (CATHETERS) ×2 IMPLANT
CLOTH BEACON ORANGE TIMEOUT ST (SAFETY) ×2 IMPLANT
GLOVE BIOGEL PI IND STRL 6 (GLOVE) ×2 IMPLANT
GLOVE SS PI  5.5 STRL (GLOVE) ×1
GLOVE SS PI 5.5 STRL (GLOVE) ×2 IMPLANT
GOWN STRL REUS W/TWL LRG LVL3 (GOWN DISPOSABLE) ×4 IMPLANT
KIT BERKELEY 1ST TRIMESTER 3/8 (MISCELLANEOUS) ×2 IMPLANT
NS IRRIG 1000ML POUR BTL (IV SOLUTION) ×2 IMPLANT
PACK VAGINAL MINOR WOMEN LF (CUSTOM PROCEDURE TRAY) ×2 IMPLANT
PAD OB MATERNITY 4.3X12.25 (PERSONAL CARE ITEMS) ×2 IMPLANT
PAD PREP 24X48 CUFFED NSTRL (MISCELLANEOUS) ×2 IMPLANT
SET BERKELEY SUCTION TUBING (SUCTIONS) ×2 IMPLANT
TOWEL OR 17X24 6PK STRL BLUE (TOWEL DISPOSABLE) ×4 IMPLANT
VACURETTE 10 RIGID CVD (CANNULA) IMPLANT
VACURETTE 7MM CVD STRL WRAP (CANNULA) IMPLANT
VACURETTE 8 RIGID CVD (CANNULA) IMPLANT
VACURETTE 9 RIGID CVD (CANNULA) IMPLANT

## 2022-09-29 NOTE — Transfer of Care (Signed)
Immediate Anesthesia Transfer of Care Note  Patient: Joann [name]  Procedure(s) Performed: DILATATION AND EVACUATION (Uterus)  Patient Location: PACU  Anesthesia Type:General  Level of Consciousness: awake, alert , oriented, and patient cooperative  Airway & Oxygen Therapy: Patient Spontanous Breathing  Post-op Assessment: Report given to RN and Post -op Vital signs reviewed and stable  Post vital signs: Reviewed and stable  Last Vitals:  Vitals Value Taken Time  BP 131/82 09/29/22 0801  Temp 36.4 C 09/29/22 0800  Pulse 97 09/29/22 0805  Resp 20 09/29/22 0805  SpO2 92 % 09/29/22 0805  Vitals shown include unvalidated device data.  Last Pain:  Vitals:   09/29/22 0550  TempSrc: Oral  PainSc: 0-No pain      Patients Stated Pain Goal: 5 (09/29/22 0550)  Complications: No notable events documented.

## 2022-09-29 NOTE — Discharge Instructions (Addendum)
No acetaminophen/Tylenol until after 1 pm today if needed. No ibuprofen, Advil, Aleve, Motrin, ketorolac, meloxicam, naproxen, or other NSAIDS until after 2 pm today if needed.   Post Anesthesia Home Care Instructions  Activity: Get plenty of rest for the remainder of the day. A responsible individual must stay with you for 24 hours following the procedure.  For the next 24 hours, DO NOT: -Drive a car -Advertising copywriter -Drink alcoholic beverages -Take any medication unless instructed by your physician -Make any legal decisions or sign important papers.  Meals: Start with liquid foods such as gelatin or soup. Progress to regular foods as tolerated. Avoid greasy, spicy, heavy foods. If nausea and/or vomiting occur, drink only clear liquids until the nausea and/or vomiting subsides. Call your physician if vomiting continues.  Special Instructions/Symptoms: Your throat may feel dry or sore from the anesthesia or the breathing tube placed in your throat during surgery. If this causes discomfort, gargle with warm salt water. The discomfort should disappear within 24 hours.  If you had a scopolamine patch placed behind your ear for the management of post- operative nausea and/or vomiting:  1. The medication in the patch is effective for 72 hours, after which it should be removed.  Wrap patch in a tissue and discard in the trash. Wash hands thoroughly with soap and water. 2. You may remove the patch earlier than 72 hours if you experience unpleasant side effects which may include dry mouth, dizziness or visual disturbances. 3. Avoid touching the patch. Wash your hands with soap and water after contact with the patch.      D & C Home care Instructions:   Personal hygiene:  Used sanitary napkins for vaginal drainage not tampons. Shower or tub bathe the day after your procedure. No douching until bleeding stops. Always wipe from front to back after  Elimination.  Activity: Do not drive or  operate any equipment today. The effects of the anesthesia are still present and drowsiness may result. Rest today, not necessarily flat bed rest, just take it easy. You may resume your normal activity in one to 2 days.  Sexual activity: No intercourse for two weeks or as indicated by your physician  Diet: Eat a light diet as desired this evening. You may resume a regular diet tomorrow.  Return to work: One to 2 days.  General Expectations of your surgery: Vaginal bleeding should be no heavier than a normal period. Spotting may continue up to 10 days. Mild cramps may continue for a couple of days. You may have a regular period in 2-6 weeks.  Unexpected observations call your doctor if these occur: persistent or heavy bleeding. Severe abdominal cramping or pain. Elevation of temperature greater than 100F. Inability to urinate.   Call for an appointment in two-four weeks as directed.

## 2022-09-29 NOTE — Interval H&P Note (Signed)
History and Physical Interval Note:  09/29/2022 7:13 AM  Joann [name]  has presented today for surgery, with the diagnosis of RETAINED PRODUCTS OF CONCEPTION.  The various methods of treatment have been discussed with the patient and family. After consideration of risks, benefits and other options for treatment, the patient has consented to  Procedure(s): DILATATION AND EVACUATION (N/A) as a surgical intervention.  The patient's history has been reviewed, patient examined, no change in status, stable for surgery.  I have reviewed the patient's chart and labs.  Questions were answered to the patient's satisfaction.    Feeling well, still having persistent vaginal bleeding, although reports it is not heavy.   Tawni Levy

## 2022-09-29 NOTE — Anesthesia Procedure Notes (Signed)
Procedure Name: LMA Insertion Date/Time: 09/29/2022 7:34 AM  Performed by: Bishop Limbo, CRNAPre-anesthesia Checklist: Patient identified, Emergency Drugs available, Suction available and Patient being monitored Patient Re-evaluated:Patient Re-evaluated prior to induction Oxygen Delivery Method: Circle System Utilized Preoxygenation: Pre-oxygenation with 100% oxygen Induction Type: IV induction Ventilation: Mask ventilation without difficulty LMA: LMA with gastric port inserted LMA Size: 4.0 Number of attempts: 2 Placement Confirmation: positive ETCO2 Tube secured with: Tape Dental Injury: Teeth and Oropharynx as per pre-operative assessment  Comments: Attempt x1 with regular LMA, no ventilation. Changed to LMA with gastric port with positive ventilations.

## 2022-09-29 NOTE — Op Note (Signed)
PREOPERATIVE DIAGNOSES: 1. Retained products of conception  POSTOPERATIVE DIAGNOSES: Same  PROCEDURE PERFORMED: Dilation and curettage using suction  SURGEON: Dr. Jule Economy  ANESTHESIA: Paracervical block and IV sedation  ESTIMATED BLOOD LOSS: 10cc.  COMPLICATIONS: None  TUBES: None.  DRAINS: None  PATHOLOGY: Products of conception  FINDINGS: On exam, under anesthesia, normal appearing vulva and vagina. Operative findings with POCs. Candidal skin infection noted in skin fold in pannus over prior c-section incision.  Procedure: The patient was taken to the operating room where she was properly prepped and draped in sterile manner under general anesthesia. After bimanual examination, the cervix was exposed with a speculum and the anterior lip of the cervix grasped with a tenaculum. Paracervical block performed. The endocervical canal was then progressively dilated. 8mm suction catheter was introduced into the uterus and to the uterine fundus. The uterus was evacuated and good tissue return was noted. A curettage was then performed using the suction curette until gritty texture noted. All instruments were removed from vagina. The sponge and lap counts were correct times 2 at this time. The patient's procedure was terminated. We then awakened her. She was sent to the Recovery Room in good condition.    Jule Economy, MD

## 2022-09-29 NOTE — Anesthesia Postprocedure Evaluation (Signed)
Anesthesia Post Note  Patient: Joann [name]  Procedure(s) Performed: DILATATION AND EVACUATION (Uterus)     Patient location during evaluation: PACU Anesthesia Type: General Level of consciousness: awake and alert Pain management: pain level controlled Vital Signs Assessment: post-procedure vital signs reviewed and stable Respiratory status: spontaneous breathing, nonlabored ventilation, respiratory function stable and patient connected to nasal cannula oxygen Cardiovascular status: blood pressure returned to baseline and stable Postop Assessment: no apparent nausea or vomiting Anesthetic complications: no  No notable events documented.  Last Vitals:  Vitals:   09/29/22 0830 09/29/22 0834  BP: (!) 169/109 (!) 155/91  Pulse: 81 81  Resp: 13 12  Temp:    SpO2: 93% 94%    Last Pain:  Vitals:   09/29/22 0830  TempSrc:   PainSc: 0-No pain                 Roby Spalla L Hiro Vipond

## 2022-09-29 NOTE — Anesthesia Preprocedure Evaluation (Signed)
Anesthesia Evaluation  Patient identified by MRN, date of birth, ID band Patient awake    Reviewed: Allergy & Precautions, NPO status , Patient's Chart, lab work & pertinent test results  Airway Mallampati: II  TM Distance: >3 FB Neck ROM: Full    Dental no notable dental hx.    Pulmonary asthma    Pulmonary exam normal breath sounds clear to auscultation       Cardiovascular hypertension, Pt. on medications Normal cardiovascular exam Rhythm:Regular Rate:Normal     Neuro/Psych  PSYCHIATRIC DISORDERS Anxiety     negative neurological ROS     GI/Hepatic negative GI ROS, Neg liver ROS,,,  Endo/Other    Morbid obesity (BMI 51)  Renal/GU negative Renal ROS  negative genitourinary   Musculoskeletal negative musculoskeletal ROS (+)    Abdominal   Peds  Hematology  (+) Blood dyscrasia, anemia   Anesthesia Other Findings   Reproductive/Obstetrics                             Anesthesia Physical Anesthesia Plan  ASA: 3  Anesthesia Plan: General   Post-op Pain Management: Tylenol PO (pre-op)*   Induction: Intravenous  PONV Risk Score and Plan: 3 and Ondansetron, Dexamethasone and Midazolam  Airway Management Planned: LMA  Additional Equipment:   Intra-op Plan:   Post-operative Plan: Extubation in OR  Informed Consent: I have reviewed the patients History and Physical, chart, labs and discussed the procedure including the risks, benefits and alternatives for the proposed anesthesia with the patient or authorized representative who has indicated his/her understanding and acceptance.     Dental advisory given  Plan Discussed with: CRNA  Anesthesia Plan Comments:        Anesthesia Quick Evaluation

## 2022-09-30 ENCOUNTER — Encounter (HOSPITAL_BASED_OUTPATIENT_CLINIC_OR_DEPARTMENT_OTHER): Payer: Self-pay | Admitting: Obstetrics and Gynecology

## 2022-09-30 LAB — SURGICAL PATHOLOGY

## 2022-12-07 ENCOUNTER — Encounter: Payer: Self-pay | Admitting: Internal Medicine

## 2022-12-07 ENCOUNTER — Ambulatory Visit (INDEPENDENT_AMBULATORY_CARE_PROVIDER_SITE_OTHER): Payer: 59 | Admitting: Internal Medicine

## 2022-12-07 VITALS — BP 128/70 | HR 99 | Temp 98.5°F | Ht 65.0 in | Wt 303.0 lb

## 2022-12-07 DIAGNOSIS — I10 Essential (primary) hypertension: Secondary | ICD-10-CM

## 2022-12-07 MED ORDER — NIFEDIPINE ER 30 MG PO TB24
30.0000 mg | ORAL_TABLET | Freq: Every day | ORAL | 1 refills | Status: AC
Start: 2022-12-07 — End: ?

## 2022-12-07 NOTE — Progress Notes (Signed)
[facility] [address] [place] [address] Dept: [phone] Dept Fax: [phone]  New Patient Office Visit  Subjective:   Joann [name] August 07, 1980 12/07/2022  Chief Complaint  Patient presents with   Establish Care   Medication Refill    HPI: Joann [name] presents today to establish care at [facility]. Introduced to Publishing rights manager role and practice setting.  All questions answered.  Concerns: See below   Discussed the use of AI scribe software for clinical note transcription with the patient, who gave verbal consent to proceed.  History of Present Illness   The patient, with a history of hypertension, is establishing care and requesting a refill of her blood pressure medication, nifedipine 30mg  PO daily. She was first diagnosed with hypertension during pregnancy and has been on medication since the birth of her child. She states her BP was elevated some prior to pregnancy, but did not require medication. She does not monitor her blood pressure at home. No CP, SHOB, headache.        The following portions of the patient's history were reviewed and updated as appropriate: past medical history, past surgical history, family history, social history, allergies, medications, and problem list.   Patient Active Problem List   Diagnosis Date Noted   Primary hypertension 12/07/2022   Postpartum care following cesarean delivery 10/15/2021   Obesity affecting pregnancy in third trimester 10/10/2021   Pregnancy with history of uterine myomectomy 10/10/2021   Chronic hypertension in obstetric context in third trimester 10/10/2021   Asthma affecting pregnancy in third trimester 10/10/2021   Pregnancy resulting from assisted reproductive technology in third trimester 10/10/2021   Morbid obesity (HCC)    Past Medical History:  Diagnosis Date   Anemia    taking iron supplementation    Anxiety    resolved per pt 2024   Asthma    mild asthma during pregnancy   Fibroids    HTN (hypertension)    Ob/gyn prescribed Nifedipine. Patient is going to get a PCP.   Miscarriage 09/2022   Pregnancy induced hypertension    Past Surgical History:  Procedure Laterality Date   CESAREAN SECTION N/A 10/15/2021   Procedure: CESAREAN SECTION;  Surgeon: Maxie Better, MD;  Location: [facility];  Service: Obstetrics;  Laterality: N/A;   DILATION AND EVACUATION N/A 09/29/2022   Procedure: DILATATION AND EVACUATION;  Surgeon: Tawni Levy, MD;  Location: [facility];  Service: Gynecology;  Laterality: N/A;   HYSTEROSCOPY  2021   MYOMECTOMY  2020   Family History  Problem Relation Age of Onset   Asthma Mother    Hypertension Mother    Diabetes Father    Hypertension Father    Asthma Sister    Cancer Maternal Aunt    Stroke Maternal Aunt    Heart disease Maternal Uncle    Hypertension Maternal Grandmother    Stroke Paternal Grandmother    Diabetes Paternal Grandmother    Outpatient Medications Prior to Visit  Medication Sig Dispense Refill   Omega-3 Fatty Acids (FISH OIL) 1200 MG CPDR Take 1,200 mg by mouth daily.     tretinoin (RETIN-A) 0.05 % cream Apply topically at bedtime.     Cholecalciferol (VITAMIN D) 50 MCG (2000 UT) CAPS Take 4,000 Units by mouth daily. Gummy     ferrous sulfate 324 MG TBEC Take 324 mg by mouth. Takes 2 tablets daily.     ibuprofen (ADVIL) 600 MG  tablet Take 1 tablet (600 mg total) by mouth every 6 (six) hours as needed. 60 tablet 3   magnesium oxide (MAG-OX) 400 MG tablet Take by mouth daily.     Naproxen Sodium (ALEVE PO) Take 250 mg by mouth once.     NIFEdipine (ADALAT CC) 30 MG 24 hr tablet Take 30 mg by mouth daily.     ondansetron (ZOFRAN-ODT) 4 MG disintegrating tablet Take 1 tablet (4 mg total) by mouth every 8 (eight) hours as needed for nausea or vomiting. (Patient not taking: Reported on 09/25/2022) 15 tablet 0   Prenatal  Vit-Fe Fumarate-FA (MULTIVITAMIN-PRENATAL) 27-0.8 MG TABS tablet Take 1 tablet by mouth daily at 12 noon. Dha and Folic Acid (Patient not taking: Reported on 09/25/2022)     No facility-administered medications prior to visit.   No Known Allergies  ROS: A complete ROS was performed with pertinent positives/negatives noted in the HPI. The remainder of the ROS are negative.   Objective:   Today's Vitals   12/07/22 0821  BP: 128/70  Pulse: 99  Temp: 98.5 F (36.9 C)  TempSrc: Temporal  SpO2: 98%  Weight: (!) 303 lb (137.4 kg)  Height: 5\' 5"  (1.651 m)    GENERAL: Well-appearing, in NAD. Well nourished.  SKIN: Pink, warm and dry. No rash, lesion, ulceration, or ecchymoses.  NECK: Trachea midline. Full ROM w/o pain or tenderness. No lymphadenopathy.  RESPIRATORY: Chest wall symmetrical. Respirations even and non-labored. Breath sounds clear to auscultation bilaterally.  CARDIAC: S1, S2 present, regular rate and rhythm. Peripheral pulses 2+ bilaterally.  EXTREMITIES: Without clubbing, cyanosis, or edema.  NEUROLOGIC: No motor or sensory deficits. Steady, even gait.  PSYCH/MENTAL STATUS: Alert, oriented x 3. Cooperative, appropriate mood and affect.   Health Maintenance Due  Topic Date Due   DTaP/Tdap/Td (1 - Tdap) Never done   PAP SMEAR-Modifier  07/05/2022   INFLUENZA VACCINE  12/03/2022    No results found for any visits on 12/07/22.  Assessment & Plan:  Assessment and Plan    Hypertension Well controlled on Nifedipine 30mg  daily. No home blood pressure monitoring. Blood pressure today 128/70. -Refill Nifedipine 30mg  daily. -Encourage home blood pressure monitoring.  No orders of the defined types were placed in this encounter.  Meds ordered this encounter  Medications   NIFEdipine (ADALAT CC) 30 MG 24 hr tablet    Sig: Take 1 tablet (30 mg total) by mouth daily.    Dispense:  90 tablet    Refill:  1    Order Specific Question:   Supervising Provider    Answer:    Garnette Gunner [[id number]]      Return in about 6 months (around 06/09/2023) for Fasting Annual Physical Exam.   Of note, portions of this note may have been created with voice recognition software [employer]). While this note has been edited for accuracy, occasional wrong-word or 'sound-a-like' substitutions may have occurred due to the inherent limitations of voice recognition software.  Salvatore Decent, FNP

## 2022-12-14 ENCOUNTER — Ambulatory Visit: Payer: No Typology Code available for payment source | Admitting: Internal Medicine

## 2023-02-25 DIAGNOSIS — Z319 Encounter for procreative management, unspecified: Secondary | ICD-10-CM | POA: Diagnosis not present

## 2023-02-25 DIAGNOSIS — Z113 Encounter for screening for infections with a predominantly sexual mode of transmission: Secondary | ICD-10-CM | POA: Diagnosis not present

## 2023-02-26 DIAGNOSIS — Z319 Encounter for procreative management, unspecified: Secondary | ICD-10-CM | POA: Diagnosis not present

## 2023-02-26 DIAGNOSIS — E559 Vitamin D deficiency, unspecified: Secondary | ICD-10-CM | POA: Diagnosis not present

## 2023-03-18 DIAGNOSIS — I10 Essential (primary) hypertension: Secondary | ICD-10-CM | POA: Diagnosis not present

## 2023-03-18 DIAGNOSIS — Z3169 Encounter for other general counseling and advice on procreation: Secondary | ICD-10-CM | POA: Diagnosis not present

## 2023-03-18 DIAGNOSIS — E669 Obesity, unspecified: Secondary | ICD-10-CM | POA: Diagnosis not present

## 2023-03-18 DIAGNOSIS — E282 Polycystic ovarian syndrome: Secondary | ICD-10-CM | POA: Diagnosis not present

## 2023-05-19 DIAGNOSIS — I10 Essential (primary) hypertension: Secondary | ICD-10-CM | POA: Diagnosis not present

## 2023-05-19 DIAGNOSIS — E282 Polycystic ovarian syndrome: Secondary | ICD-10-CM | POA: Diagnosis not present

## 2023-05-19 DIAGNOSIS — E669 Obesity, unspecified: Secondary | ICD-10-CM | POA: Diagnosis not present

## 2023-05-21 DIAGNOSIS — Z1231 Encounter for screening mammogram for malignant neoplasm of breast: Secondary | ICD-10-CM | POA: Diagnosis not present

## 2023-05-24 DIAGNOSIS — N979 Female infertility, unspecified: Secondary | ICD-10-CM | POA: Diagnosis not present

## 2023-05-24 DIAGNOSIS — N978 Female infertility of other origin: Secondary | ICD-10-CM | POA: Diagnosis not present

## 2023-05-26 ENCOUNTER — Encounter: Payer: Self-pay | Admitting: Nurse Practitioner

## 2023-06-03 DIAGNOSIS — N979 Female infertility, unspecified: Secondary | ICD-10-CM | POA: Diagnosis not present

## 2023-06-07 DIAGNOSIS — N979 Female infertility, unspecified: Secondary | ICD-10-CM | POA: Diagnosis not present

## 2023-06-08 DIAGNOSIS — N979 Female infertility, unspecified: Secondary | ICD-10-CM | POA: Diagnosis not present

## 2023-06-09 ENCOUNTER — Encounter: Payer: 59 | Admitting: Internal Medicine

## 2023-06-09 DIAGNOSIS — N979 Female infertility, unspecified: Secondary | ICD-10-CM | POA: Diagnosis not present

## 2023-06-24 DIAGNOSIS — N978 Female infertility of other origin: Secondary | ICD-10-CM | POA: Diagnosis not present

## 2023-06-24 DIAGNOSIS — N979 Female infertility, unspecified: Secondary | ICD-10-CM | POA: Diagnosis not present

## 2023-07-08 DIAGNOSIS — N979 Female infertility, unspecified: Secondary | ICD-10-CM | POA: Diagnosis not present

## 2023-07-22 DIAGNOSIS — Z0000 Encounter for general adult medical examination without abnormal findings: Secondary | ICD-10-CM | POA: Diagnosis not present

## 2023-07-22 DIAGNOSIS — N979 Female infertility, unspecified: Secondary | ICD-10-CM | POA: Diagnosis not present

## 2023-07-22 DIAGNOSIS — N978 Female infertility of other origin: Secondary | ICD-10-CM | POA: Diagnosis not present

## 2023-07-22 DIAGNOSIS — Z113 Encounter for screening for infections with a predominantly sexual mode of transmission: Secondary | ICD-10-CM | POA: Diagnosis not present

## 2023-07-26 IMAGING — US US MFM OB FOLLOW-UP
1 series · 13 of 28 positions shown · non-contrast
Comparison: none

[Series 1: us mfm ob follow-up · 13 of 72 slices shown]
[im 3/72]
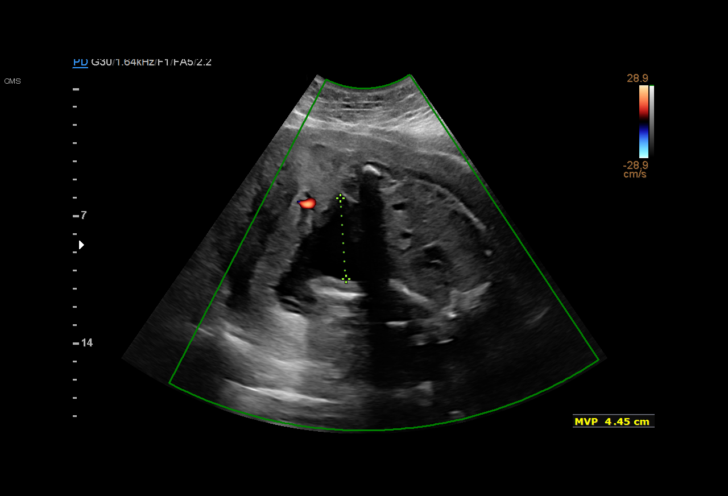
[im 8/72]
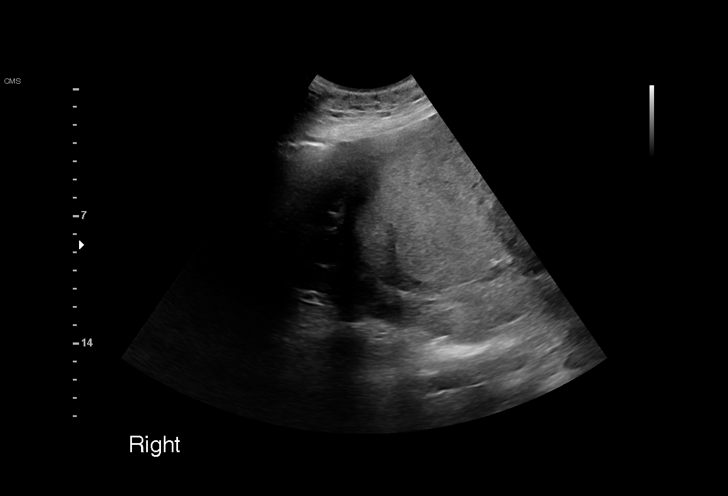
[im 14/72]
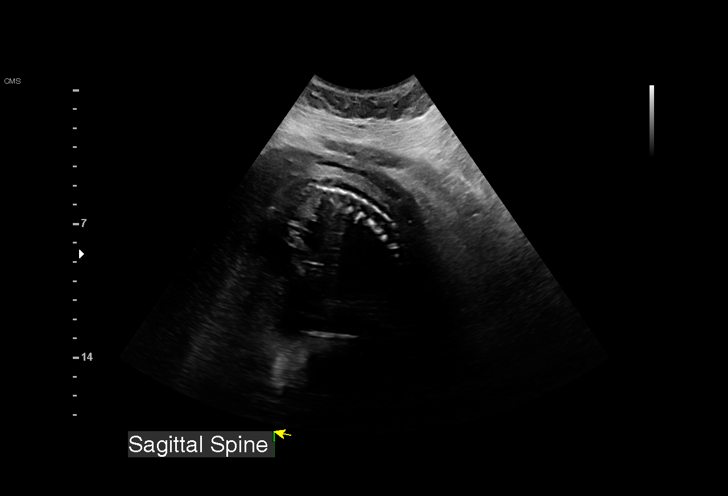
[im 19/72]
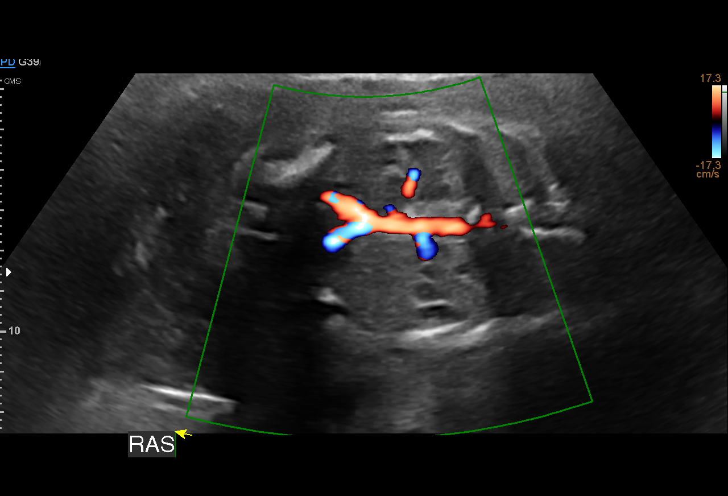
[im 24/72]
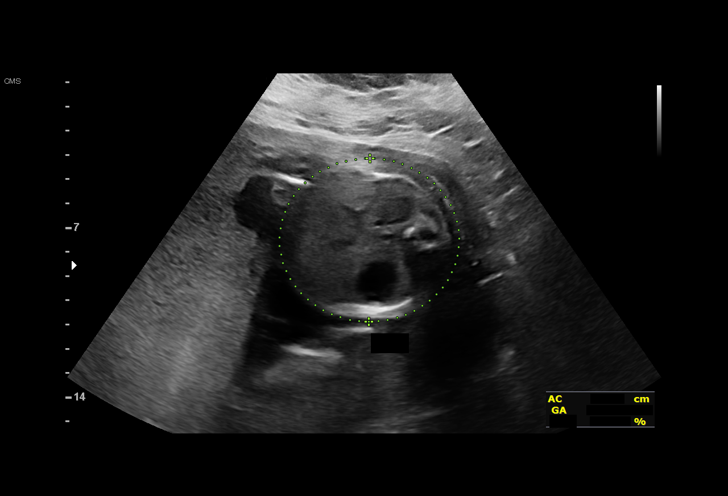
[im 29/72]
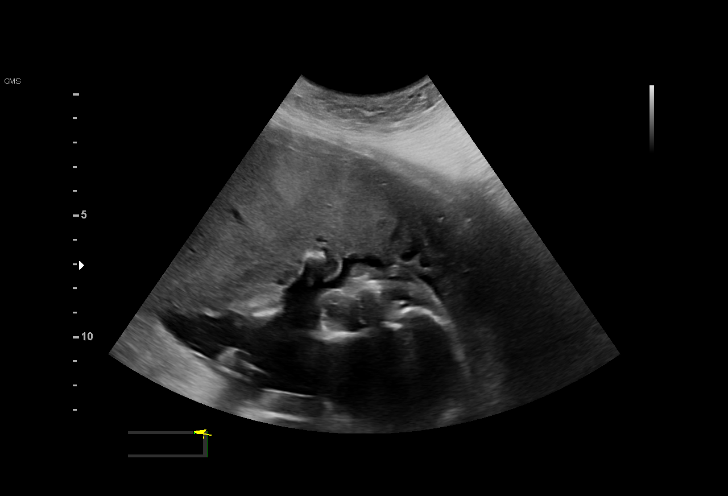
[im 37/72]
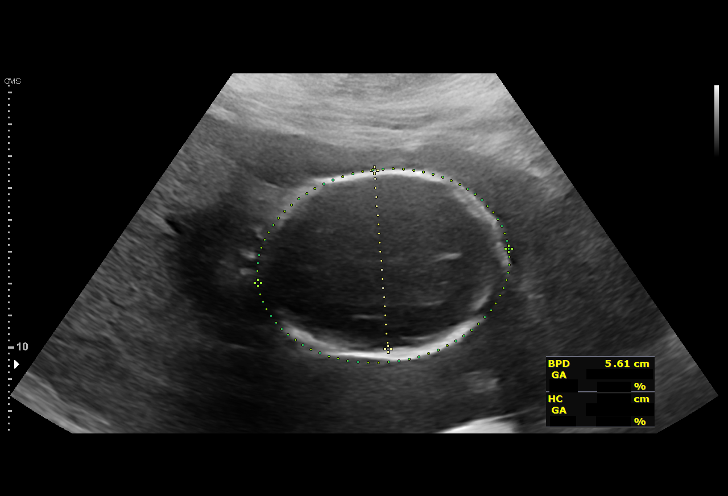
[im 43/72]
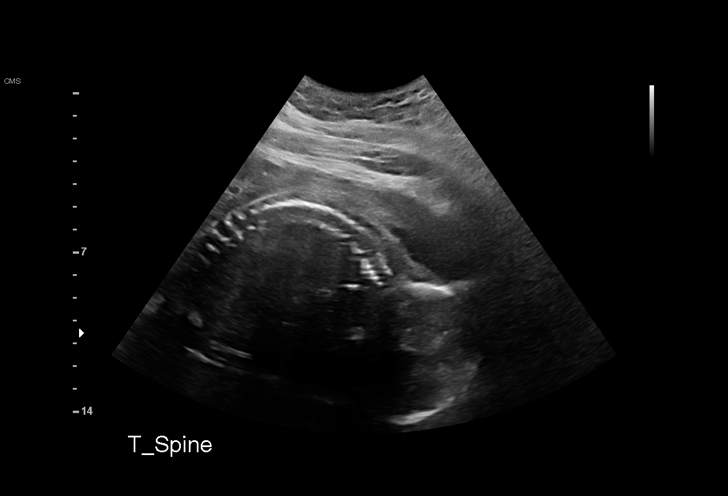
[im 48/72]
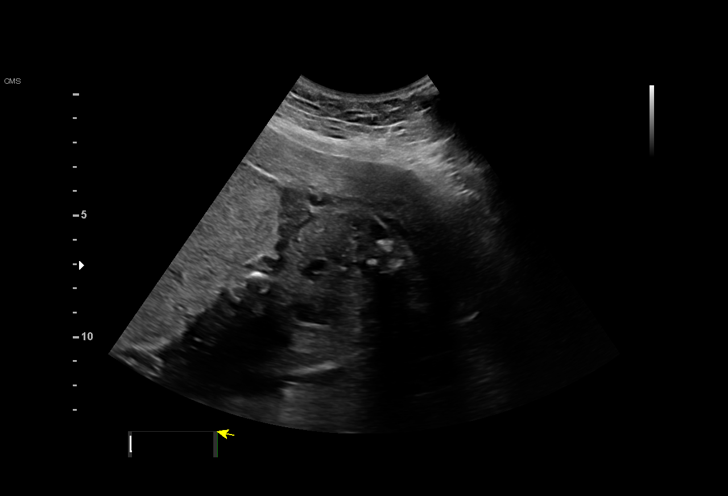
[im 53/72]
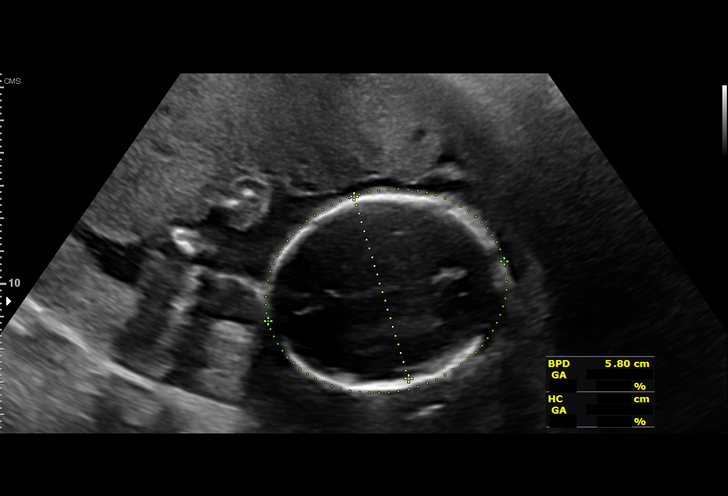
[im 58/72]
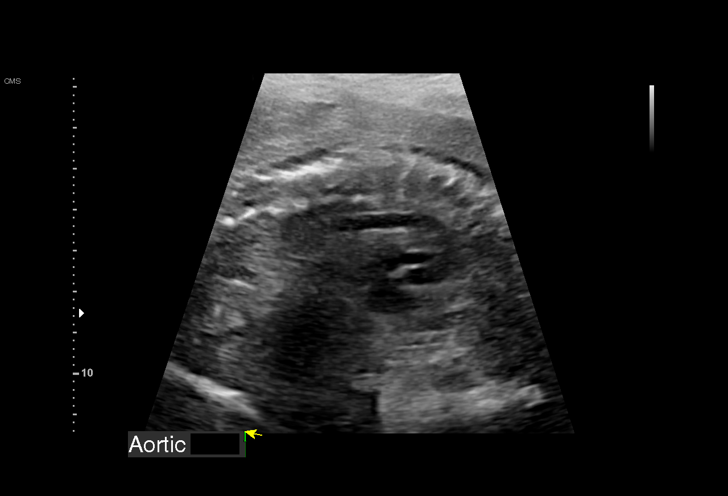
[im 64/72]
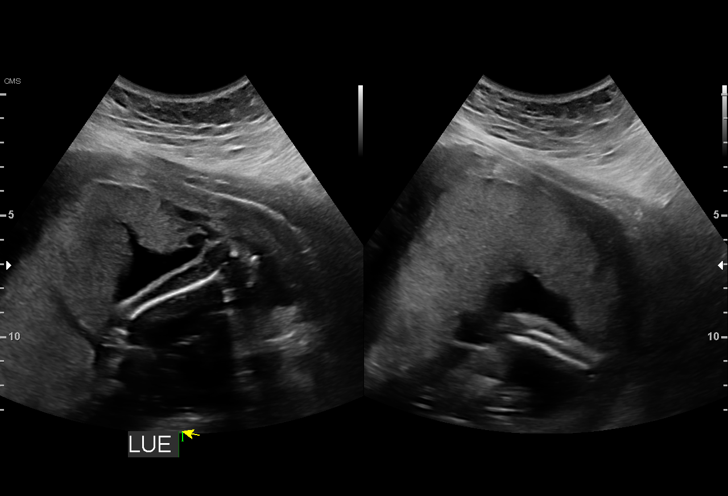
[im 69/72]
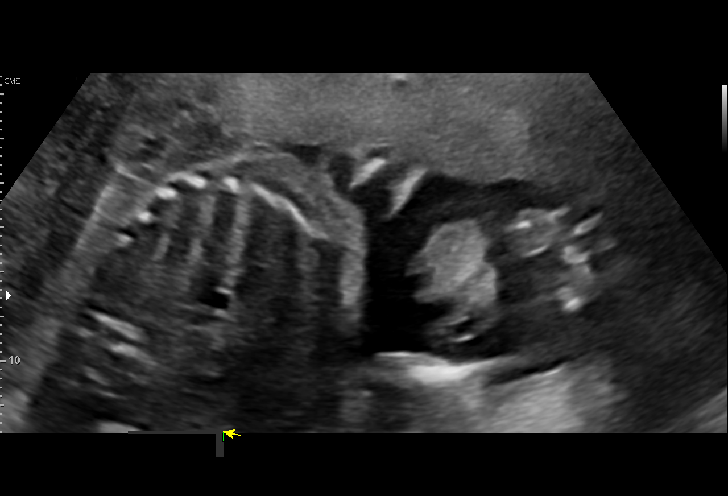

[13 of 28 positions shown; findings below may reference images not displayed]

OB/GYN

Indications

 23 weeks gestation of pregnancy
 Advanced maternal age primigravida 35+,
 second trimester (40 yo)
 Obesity complicating pregnancy, second
 trimester (BMI 51)
 Hypertension - Chronic/Pre-existing (no
 meds)
 Pregnancy resulting from assisted
 reproductive technology (IVF)
 Antenatal follow-up for nonvisualized fetal
 anatomy
 AFP Negative; Normal Fetal ECHO with
 [facility]
 Uterine fibroids affecting pregnancy in        O34.12,
 second trimester, antepartum (h/o
 myonectomy x 2)
Fetal Evaluation

 Num Of Fetuses:         1
 Fetal Heart Rate(bpm):  155
 Cardiac Activity:       Observed
 Presentation:           Transverse, head to maternal right
 Placenta:               Posterior Fundal
 P. Cord Insertion:      Previously Visualized
 Amniotic Fluid
 AFI FV:      Within normal limits

                             Largest Pocket(cm)

Biometry

 BPD:      57.1  mm     G. Age:  23w 3d         29  %    CI:        69.95   %    70 - 86
                                                         FL/HC:      20.3   %    18.7 -
 HC:      217.8  mm     G. Age:  23w 6d         31  %    HC/AC:      1.02        1.05 -
 AC:      214.2  mm     G. Age:  25w 6d         93  %    FL/BPD:     77.6   %    71 - 87
 FL:       44.3  mm     G. Age:  24w 4d         62  %    FL/AC:      20.7   %    20 - 24
 CER:        25  mm     G. Age:  22w 6d         37  %

 LV:        5.4  mm

 Est. FW:     770  gm    1 lb 11 oz      92  %
OB History

 Gravidity:    1         Term:   0        Prem:   0        SAB:   0
 TOP:          0       Ectopic:  0        Living: 0
Gestational Age

 LMP:           23w 0d        Date:  02/04/21                 EDD:   11/11/21
 Clinical EDD:  23w 6d                                        EDD:   11/05/21
 U/S Today:     24w 3d                                        EDD:   11/01/21
 Best:          23w 6d     Det. By:  Clinical EDD             EDD:   11/05/21
Anatomy

 Cranium:               Appears normal         Aortic Arch:            Appears normal
 Cavum:                 Appears normal         Ductal Arch:            Appears normal
 Ventricles:            Appears normal         Diaphragm:              Appears normal
 Choroid Plexus:        Appears normal         Stomach:                Appears normal, left
                                                                       sided
 Cerebellum:            Appears normal         Abdomen:                Appears normal
 Posterior Fossa:       Not well visualized    Abdominal Wall:         Previously seen
 Nuchal Fold:           Not applicable (>20    Cord Vessels:           Appears normal (3
                        wks GA)                                        vessel cord)
 Face:                  Appears normal         Kidneys:                Appear normal
                        (orbits and profile)
 Lips:                  Appears normal         Bladder:                Appears normal
 Thoracic:              Appears normal         Spine:                  Limited views
                                                                       appear normal
 Heart:                 Previously seen        Upper Extremities:      Appears normal
 RVOT:                  Not well visualized    Lower Extremities:      Appears normal
 LVOT:                  Not well visualized

 Other:  Female gender previously seen. Nasal bone visualized. Technically
         difficult due to maternal habitus and fetal position.
Cervix Uterus Adnexa

 Cervix
 Length:           3.17  cm.
 Normal appearance by transabdominal scan.
 Right Ovary
 Not visualized.

 Left Ovary
 Not visualized.

 Cul De Sac
 No free fluid seen.

 Adnexa
 No abnormality visualized.
Impression

 Follow up growth due to AMA 40 yo, BMI > 50, CHTN no med
 and IVF pregnancy.
 Normal interval growth with measurements consistent with
 dates
 Good fetal movement and amniotic fluid volume
 Suboptimal views of the fetal anatomy was observed
 secondary to fetal position and maternal habitus.

 Blood pressure 139/79 mmHg.

 A fetal echo was performed at [facility] and reported as normal
 but limited due to maternal habitus
Recommendations

 Follow up growth in 4 weeks.

## 2023-08-03 DIAGNOSIS — N979 Female infertility, unspecified: Secondary | ICD-10-CM | POA: Diagnosis not present

## 2023-08-06 DIAGNOSIS — N979 Female infertility, unspecified: Secondary | ICD-10-CM | POA: Diagnosis not present

## 2023-08-09 DIAGNOSIS — N979 Female infertility, unspecified: Secondary | ICD-10-CM | POA: Diagnosis not present

## 2023-08-11 DIAGNOSIS — N979 Female infertility, unspecified: Secondary | ICD-10-CM | POA: Diagnosis not present

## 2023-08-27 DIAGNOSIS — N979 Female infertility, unspecified: Secondary | ICD-10-CM | POA: Diagnosis not present

## 2023-08-27 DIAGNOSIS — N978 Female infertility of other origin: Secondary | ICD-10-CM | POA: Diagnosis not present

## 2023-09-02 DIAGNOSIS — N979 Female infertility, unspecified: Secondary | ICD-10-CM | POA: Diagnosis not present

## 2023-09-09 DIAGNOSIS — N979 Female infertility, unspecified: Secondary | ICD-10-CM | POA: Diagnosis not present

## 2023-09-14 DIAGNOSIS — N979 Female infertility, unspecified: Secondary | ICD-10-CM | POA: Diagnosis not present

## 2023-09-22 ENCOUNTER — Encounter: Payer: Self-pay | Admitting: Internal Medicine

## 2023-10-05 IMAGING — US US MFM FETAL BPP W/O NON-STRESS
1 series · 14 of 19 positions shown · non-contrast
Comparison: none

[Series 1: us mfm fetal bpp w/o non-stress · 19 acquisitions, 14 frames shown]
[im 1/19]
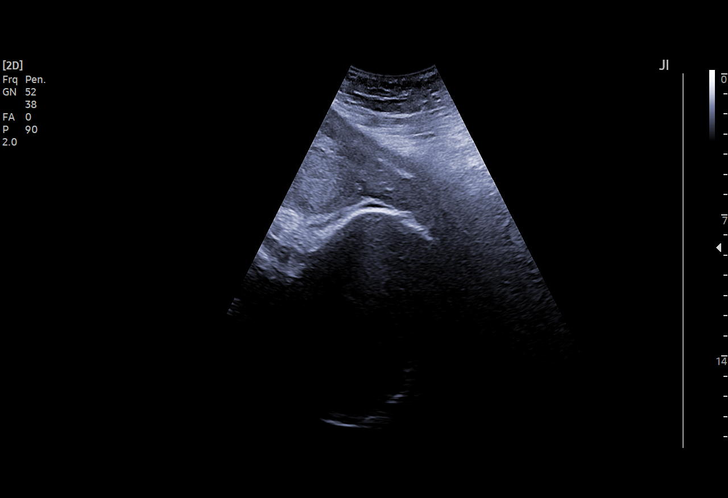
[im 3/19]
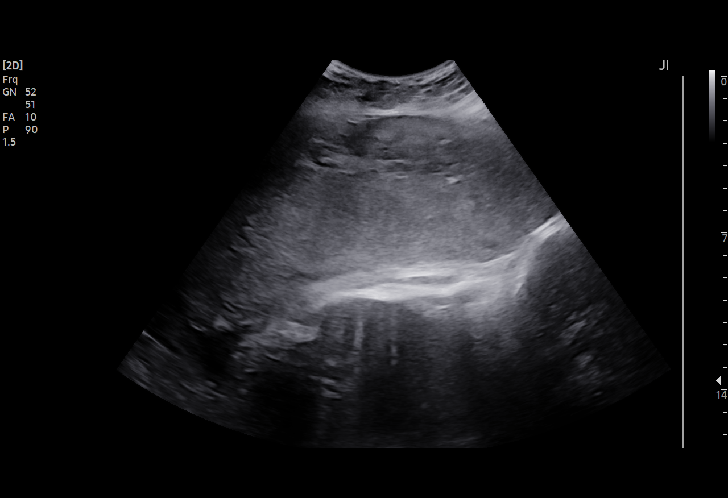
[im 4/19]
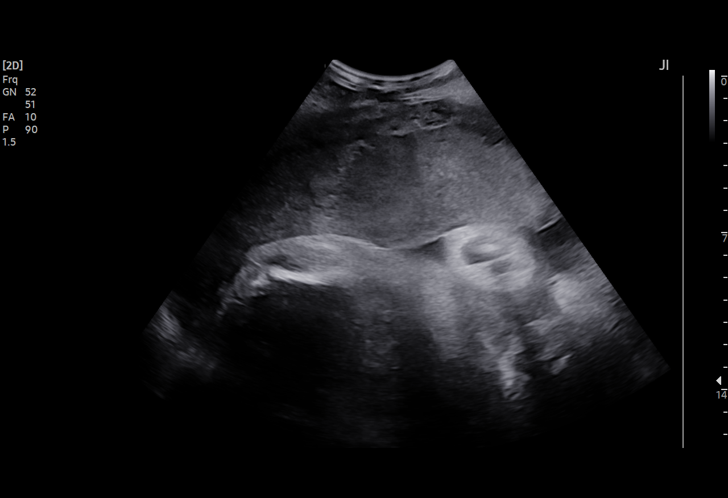
[im 5/19]
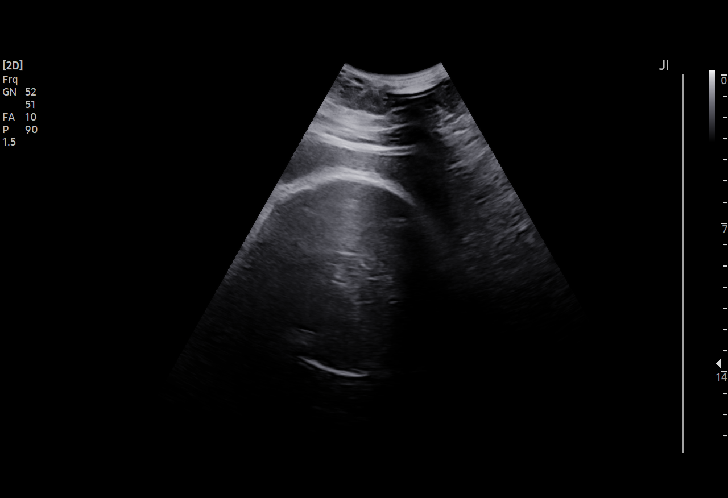
[im 7/19]
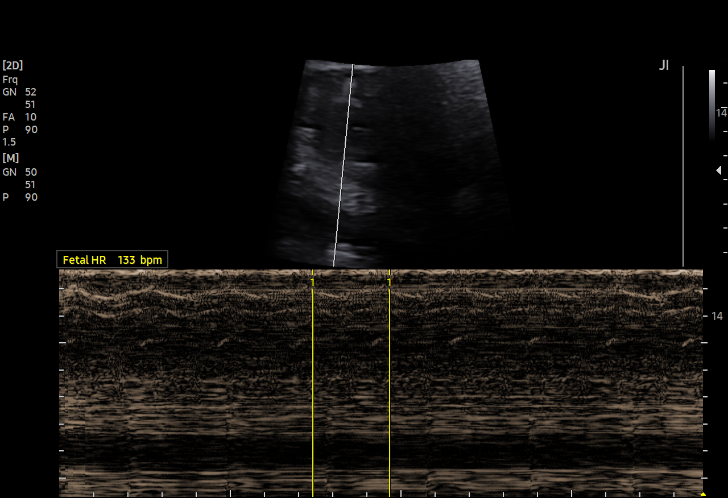
[im 8/19]
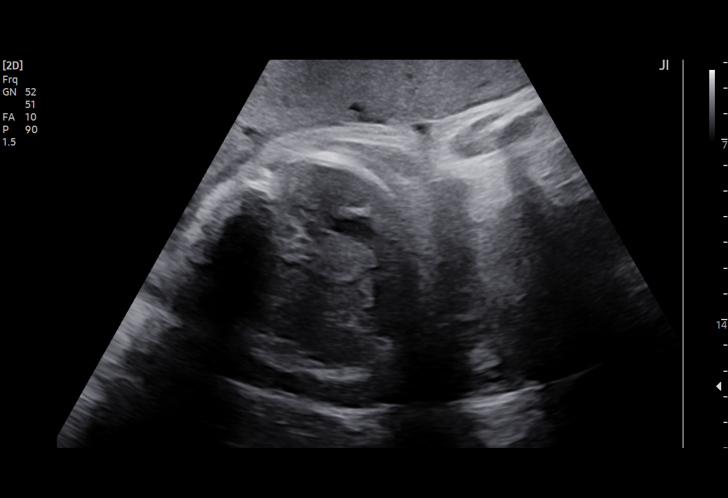
[im 9/19]
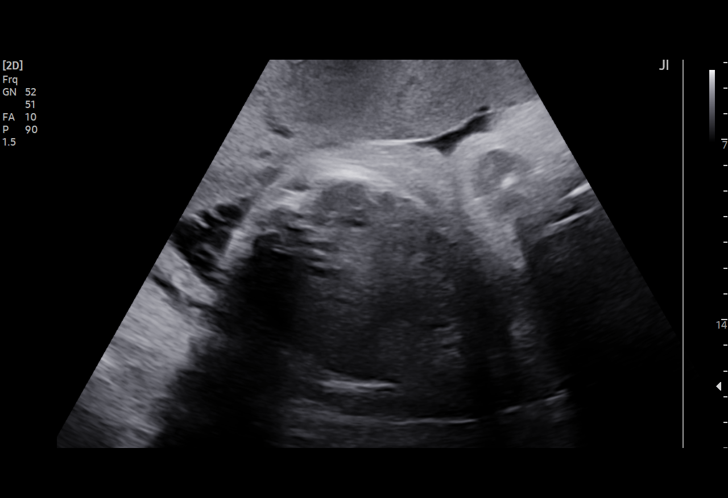
[im 11/19]
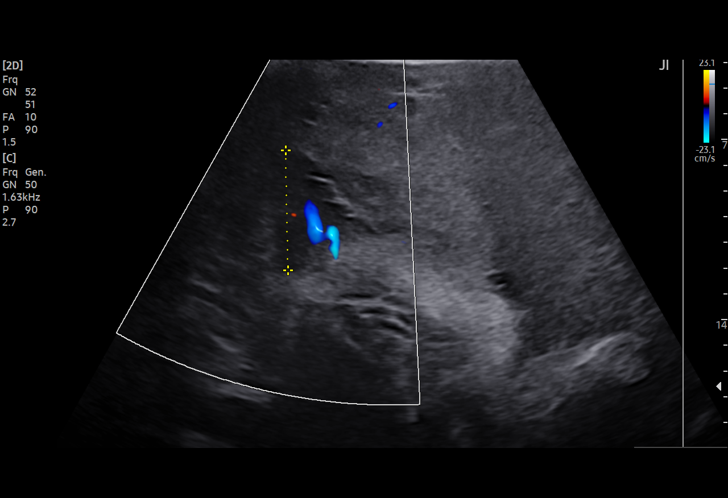
[im 12/19]
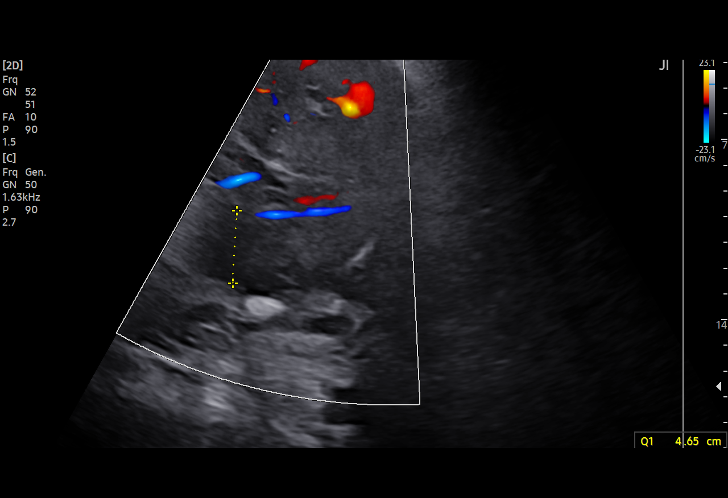
[im 13/19]
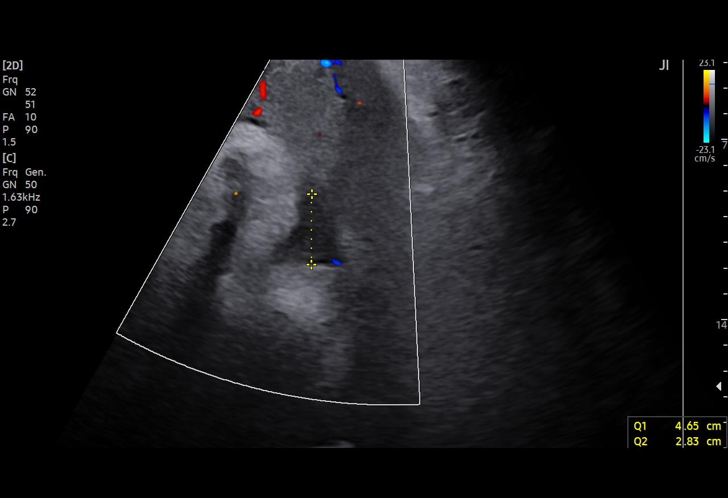
[im 15/19]
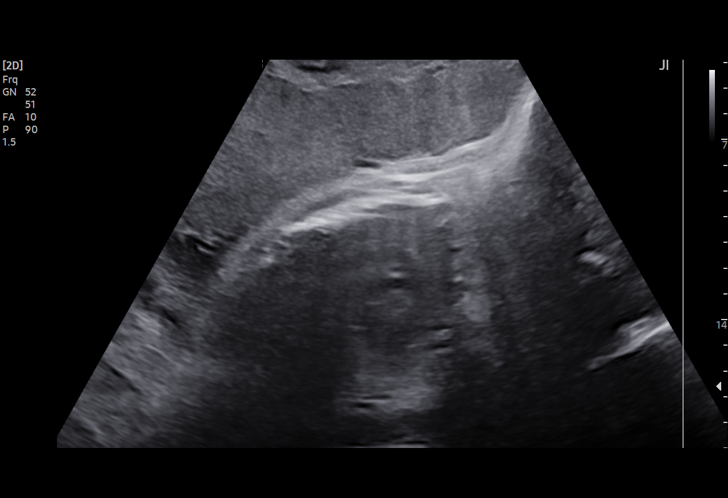
[im 16/19]
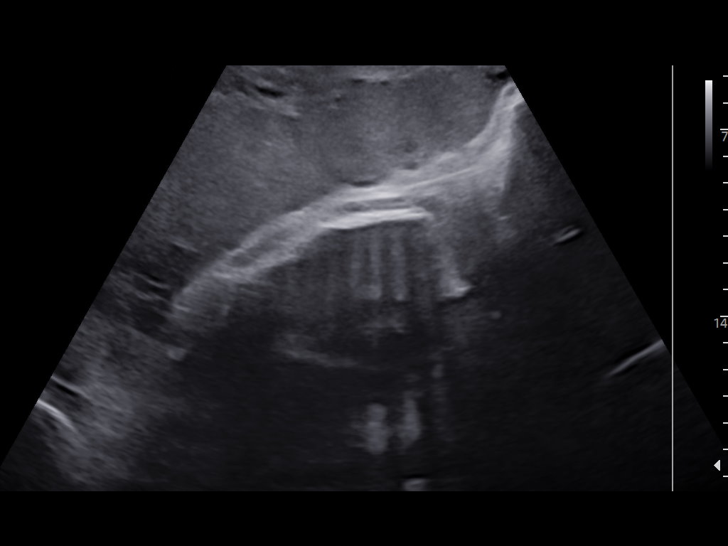
[im 17/19]
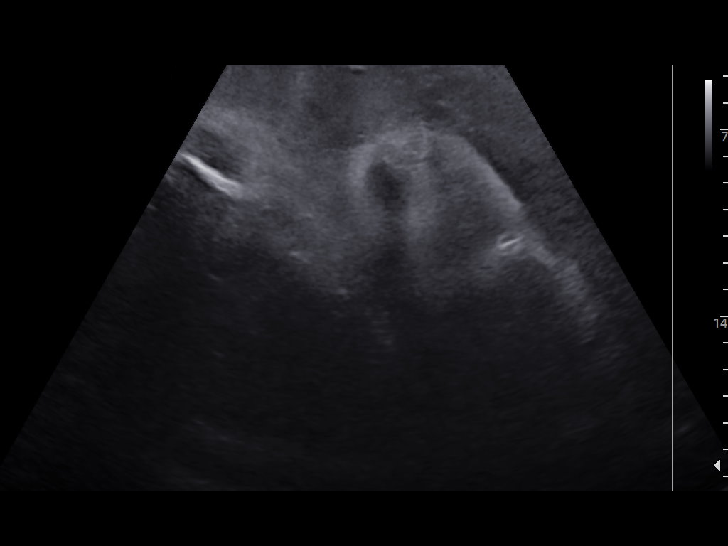
[im 19/19]
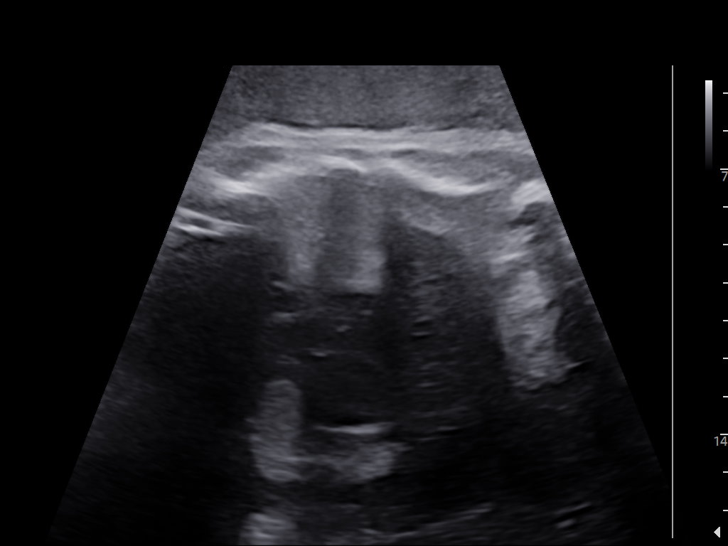

[14 of 19 positions shown; findings below may reference images not displayed]

OB/GYN

Indications

 Obesity complicating pregnancy, third
 trimester (BMI 51)
 34 weeks gestation of pregnancy
 Advanced maternal age multigravida 35+,
 third trimester (40 yrs)
 Hypertension - Chronic/Pre-existing (no
 meds)
 Pregnancy resulting from assisted
 reproductive technology (IVF)
 AFP Negative; Normal Fetal ECHO with
 [facility]
 Uterine fibroids affecting pregnancy in        O34.12,
 second trimester, antepartum (h/o
 myonectomy x 2)
Vital Signs

 BP:          121/58
Fetal Evaluation

 Num Of Fetuses:         1
 Fetal Heart Rate(bpm):  133
 Cardiac Activity:       Observed
 Presentation:           Cephalic
 Placenta:               Anterior
 P. Cord Insertion:      Previously Visualized

 Amniotic Fluid
 AFI FV:      Within normal limits

 AFI Sum(cm)     %Tile       Largest Pocket(cm)
 13.76           47

 RUQ(cm)       RLQ(cm)       LUQ(cm)        LLQ(cm)

Biophysical Evaluation

 Amniotic F.V:   Within normal limits       F. Tone:        Observed
 F. Movement:    Observed                   Score:          [DATE]
 F. Breathing:   Observed
OB History

 Gravidity:    1         Term:   0        Prem:   0        SAB:   0
 TOP:          0       Ectopic:  0        Living: 0
Gestational Age

 LMP:           33w 1d        Date:  02/04/21                  EDD:   11/11/21
 Clinical EDD:  34w 0d                                        EDD:   11/05/21
 Best:          34w 0d     Det. By:  Clinical EDD             EDD:   11/05/21
Anatomy

 Thoracic:              Appears normal         Kidneys:                Appear normal
 Stomach:               Appears normal, left   Bladder:                Appears normal
                        sided

 Other:  Technically difficult due to maternal habitus.
Impression

 Prepregnancy BMI 51
 Chronic hypertension well-controlled without
 antihypertensives.
 Advanced maternal age.

 Amniotic fluid is normal and good fetal activity seen.
 Cephalic presentation.  Antenatal testing is reassuring.  BPP
 [DATE].
Recommendations

 Continue weekly BPP till delivery.
                Okinchon, Weng Fun

## 2023-10-19 IMAGING — US US MFM OB FOLLOW-UP
1 series · 13 of 28 positions shown · non-contrast
Comparison: none

[Series 1: us mfm ob follow-up · 36 acquisitions, 13 frames shown]
[im 2/36]
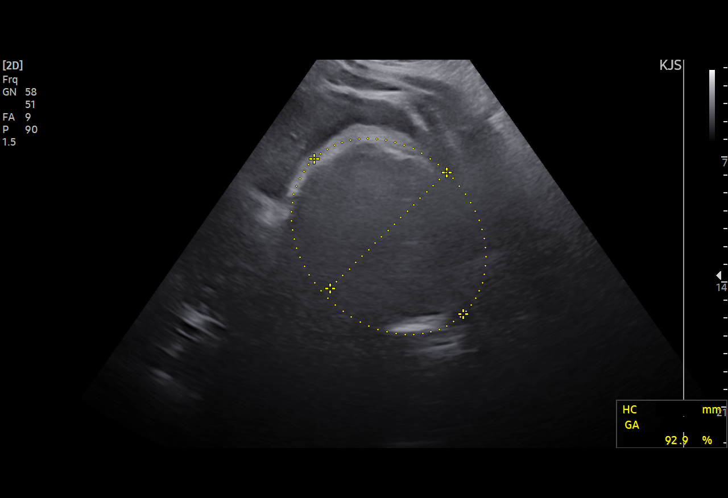
[im 4/36]
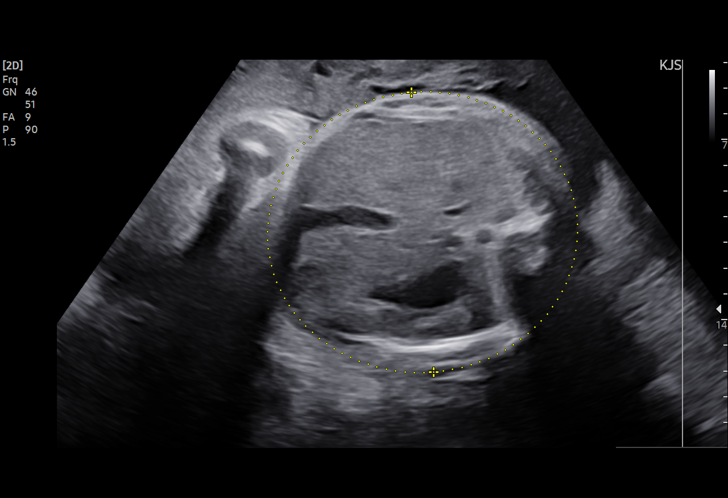
[im 7/36]
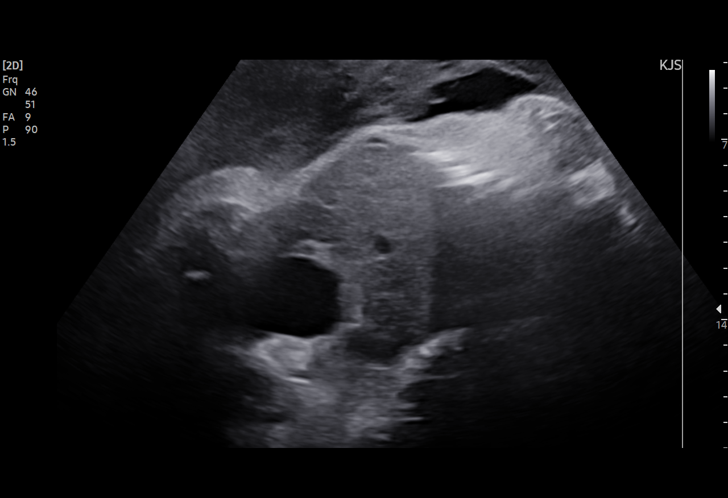
[im 10/36]
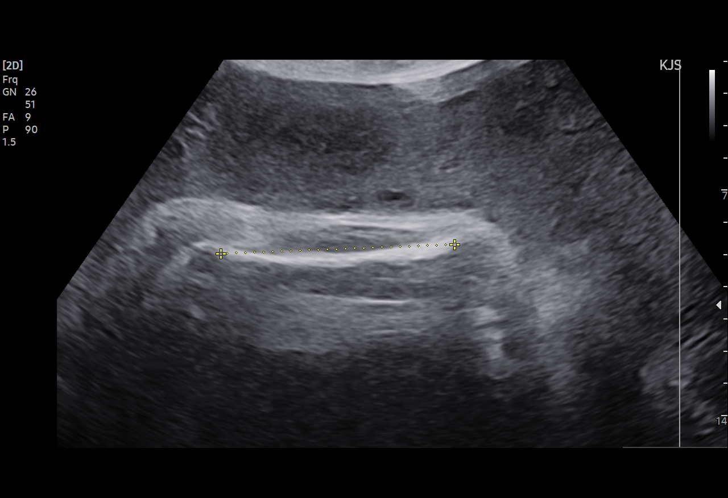
[im 12/36]
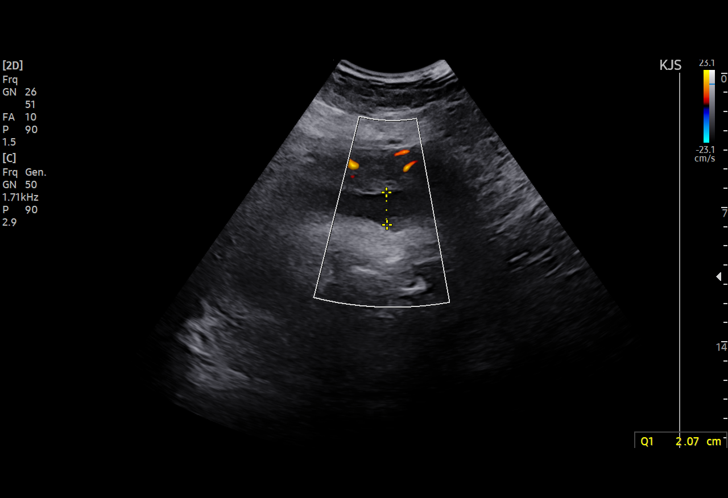
[im 15/36]
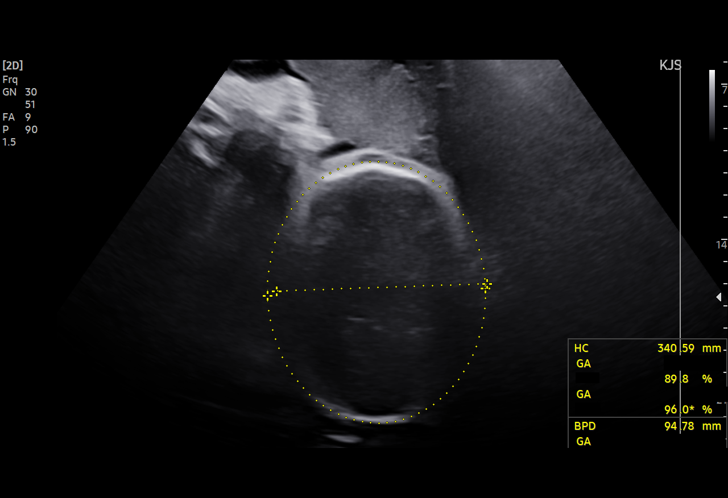
[im 19/36]
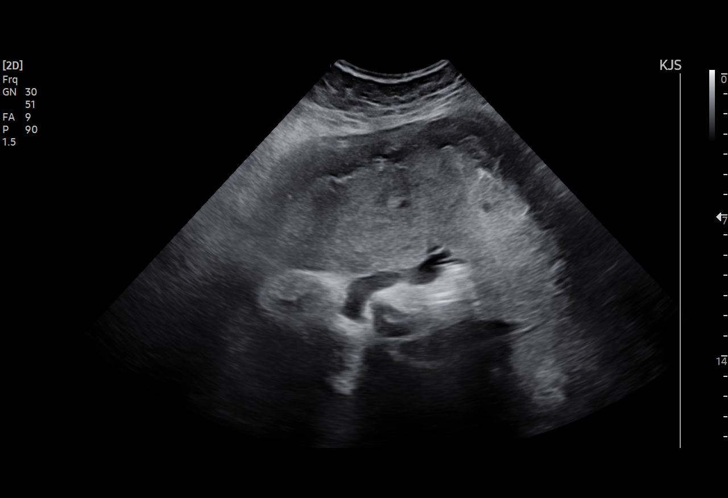
[im 21/36]
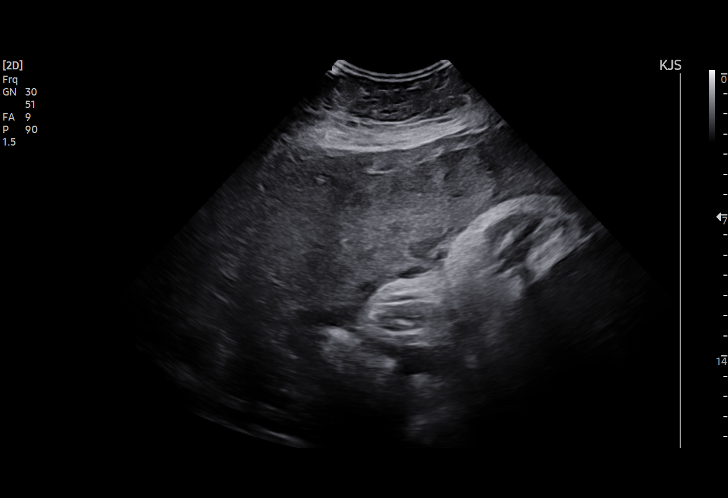
[im 24/36]
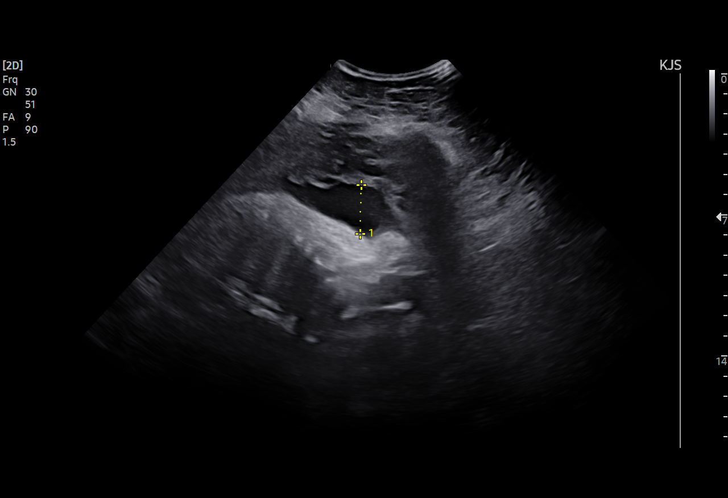
[im 26/36]
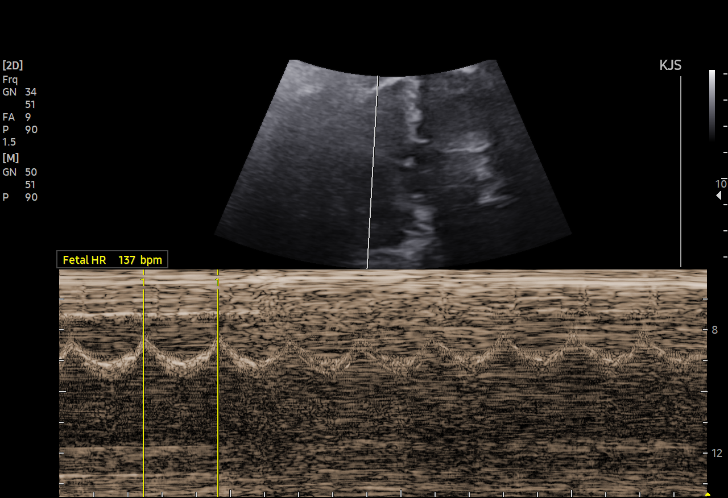
[im 29/36]
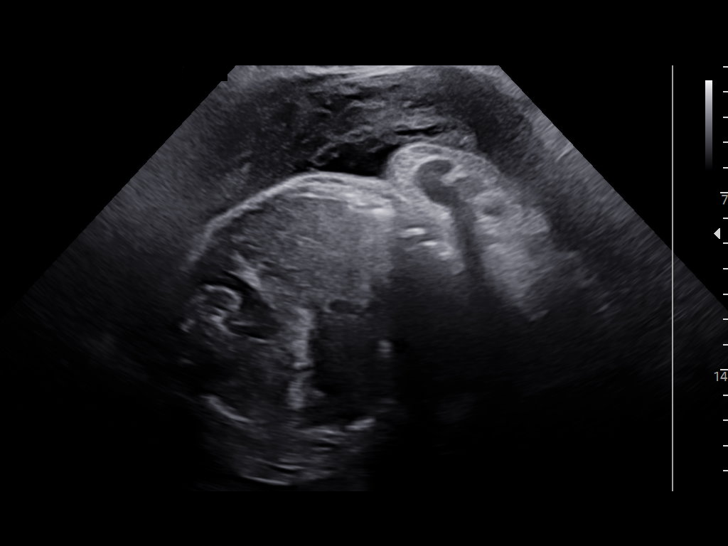
[im 32/36]
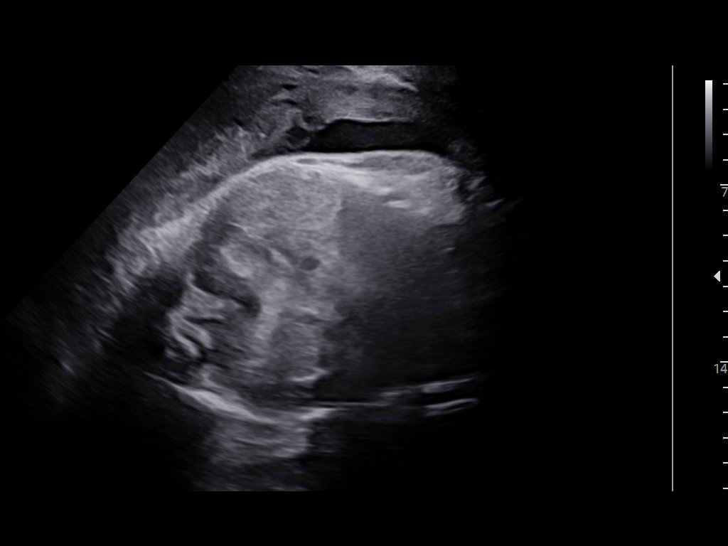
[im 34/36]
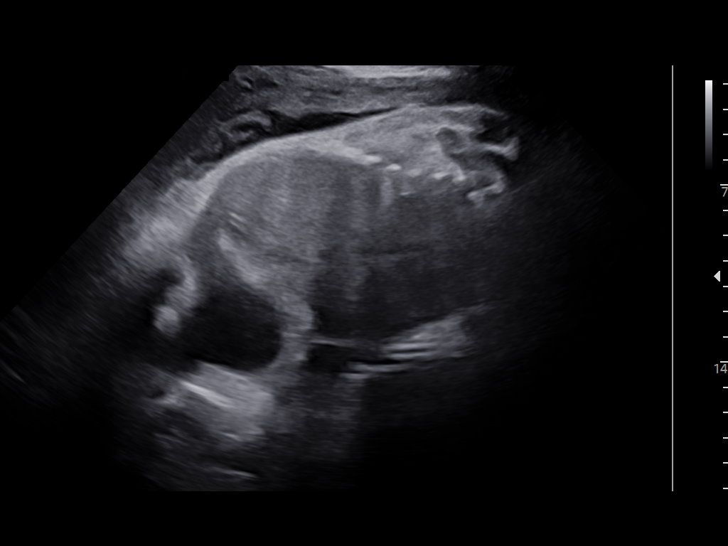

[13 of 28 positions shown; findings below may reference images not displayed]

OB/GYN

                                                      [facility]

Indications

 Obesity complicating pregnancy, third
 trimester (BMI 51)
 Advanced maternal age multigravida 35+,
 third trimester (40 yrs)
 Hypertension - Chronic/Pre-existing (no
 meds)
 Pregnancy resulting from assisted
 reproductive technology (IVF)
 Uterine fibroids affecting pregnancy in        O34.12,
 second trimester, antepartum (h/o
 myonectomy x 2)
 Encounter for other antenatal screening
 follow-up
 36 weeks gestation of pregnancy
 AFP Negative; Normal Fetal ECHO with
 [facility]
Fetal Evaluation

 Num Of Fetuses:         1
 Fetal Heart Rate(bpm):  137
 Cardiac Activity:       Observed
 Presentation:           Cephalic
 Placenta:               Anterior Fundal
 P. Cord Insertion:      Previously Visualized

 AFI Sum(cm)     %Tile       Largest Pocket(cm)


 RUQ(cm)       RLQ(cm)       LUQ(cm)        LLQ(cm)

Biophysical Evaluation

 Amniotic F.V:   Pocket => 2 cm             F. Tone:        Observed
 F. Movement:    Observed                   Score:          [DATE]
 F. Breathing:   Observed
Biometry

 BPD:     91.44  mm     G. Age:  37w 1d         85  %    CI:        73.29   %    70 - 86
                                                         FL/HC:      21.4   %    20.1 -
 HC:    339.44   mm     G. Age:  39w 0d         88  %    HC/AC:      0.96        0.93 -
 AC:    353.48   mm     G. Age:  39w 2d       > 99  %    FL/BPD:     79.6   %    71 - 87
 FL:       72.8  mm     G. Age:  37w 2d         77  %    FL/AC:      20.6   %    20 - 24

 Est. FW:    7984  gm    7 lb 12 oz      98  %
OB History

 Gravidity:    1         Term:   0        Prem:   0        SAB:   0
 TOP:          0       Ectopic:  0        Living: 0
Gestational Age

 LMP:           35w 1d        Date:  02/04/21                  EDD:   11/11/21
 Clinical EDD:  36w 0d                                        EDD:   11/05/21
 U/S Today:     38w 1d                                        EDD:   10/21/21
 Best:          36w 0d     Det. By:  Clinical EDD             EDD:   11/05/21
Anatomy

 Cranium:               Previously seen        LVOT:                   Previously seen
 Cavum:                 Previously seen        Aortic Arch:            Previously seen
 Ventricles:            Previously seen        Ductal Arch:            Previously seen
 Choroid Plexus:        Previously seen        Diaphragm:              Previously seen
 Cerebellum:            Previously seen        Stomach:                Appears normal, left
                                                                       sided
 Posterior Fossa:       Not well visualized    Abdomen:                Previously seen
 Nuchal Fold:           Not applicable (>20    Abdominal Wall:         Previously seen
                        wks GA)
 Face:                  Orbits and profile     Cord Vessels:           Previously seen
                        previously seen
 Lips:                  Previously seen        Kidneys:                Previously seen
 Palate:                Not well visualized    Bladder:                Appears normal
 Thoracic:              Previously seen        Spine:                  Limited views
                                                                       previously seen
 Heart:                 Previously seen        Upper Extremities:      Previously seen
 RVOT:                  Previously seen        Lower Extremities:      Previously seen

 Other:  Female gender previously seen. Nasal bone prev visualized.
         Technically difficult due to maternal habitus and fetal position.
Impression

 Chronic hypertension.  Well-controlled without
 antihypertensives.  Blood pressure today at her office is
 116/66 mmHg.
 Advanced maternal age.
 IVF pregnancy.
 On today's ultrasound, the estimated fetal weight is at the
 98th percentile and the abdominal circumference
 measurement is at the 99th percentile.  Amniotic fluid is low
 for this gestational age but no oligohydramnios is seen.
 Cephalic presentation.
 Antenatal testing is reassuring.  BPP [DATE].
 Patient will be undergoing cesarean delivery next week.
                Frost, Ninos

## 2023-11-17 ENCOUNTER — Other Ambulatory Visit: Payer: Self-pay | Admitting: Medical Genetics

## 2023-11-19 ENCOUNTER — Encounter: Payer: Self-pay | Admitting: Advanced Practice Midwife

## 2023-12-03 ENCOUNTER — Other Ambulatory Visit (HOSPITAL_COMMUNITY)

## 2024-02-23 ENCOUNTER — Other Ambulatory Visit: Payer: Self-pay | Admitting: Medical Genetics

## 2024-02-23 DIAGNOSIS — Z006 Encounter for examination for normal comparison and control in clinical research program: Secondary | ICD-10-CM
